# Patient Record
Sex: Female | Born: 1945 | Race: Black or African American | Hispanic: No | Smoking: Former smoker
Health system: Southern US, Community
[De-identification: ages and names within clinical notes are randomized; demographics above are authoritative.]

## PROBLEM LIST (undated history)

## (undated) DIAGNOSIS — J45909 Unspecified asthma, uncomplicated: Secondary | ICD-10-CM

## (undated) DIAGNOSIS — I639 Cerebral infarction, unspecified: Secondary | ICD-10-CM

## (undated) DIAGNOSIS — N189 Chronic kidney disease, unspecified: Secondary | ICD-10-CM

## (undated) DIAGNOSIS — C801 Malignant (primary) neoplasm, unspecified: Secondary | ICD-10-CM

## (undated) DIAGNOSIS — R7303 Prediabetes: Secondary | ICD-10-CM

## (undated) DIAGNOSIS — E785 Hyperlipidemia, unspecified: Secondary | ICD-10-CM

## (undated) DIAGNOSIS — G4730 Sleep apnea, unspecified: Secondary | ICD-10-CM

## (undated) DIAGNOSIS — J449 Chronic obstructive pulmonary disease, unspecified: Secondary | ICD-10-CM

## (undated) DIAGNOSIS — I509 Heart failure, unspecified: Secondary | ICD-10-CM

## (undated) DIAGNOSIS — I10 Essential (primary) hypertension: Secondary | ICD-10-CM

## (undated) HISTORY — DX: Chronic kidney disease, unspecified: N18.9

## (undated) HISTORY — DX: Unspecified asthma, uncomplicated: J45.909

## (undated) HISTORY — DX: Essential (primary) hypertension: I10

## (undated) HISTORY — DX: Cerebral infarction, unspecified: I63.9

## (undated) HISTORY — DX: Chronic obstructive pulmonary disease, unspecified: J44.9

## (undated) HISTORY — DX: Hyperlipidemia, unspecified: E78.5

## (undated) HISTORY — DX: Malignant (primary) neoplasm, unspecified: C80.1

## (undated) HISTORY — DX: Heart failure, unspecified: I50.9

---

## 1988-10-16 HISTORY — PX: OTHER SURGICAL HISTORY: SHX169

## 2018-10-23 LAB — PULMONARY FUNCTION TEST

## 2020-06-28 ENCOUNTER — Ambulatory Visit (INDEPENDENT_AMBULATORY_CARE_PROVIDER_SITE_OTHER): Payer: Medicare PPO

## 2020-06-28 ENCOUNTER — Other Ambulatory Visit: Payer: Self-pay

## 2020-06-28 ENCOUNTER — Ambulatory Visit: Payer: Medicare PPO | Admitting: Pulmonary Disease

## 2020-06-28 ENCOUNTER — Encounter: Payer: Self-pay | Admitting: Pulmonary Disease

## 2020-06-28 VITALS — BP 130/80 | HR 82 | Temp 97.6°F | Ht 69.0 in | Wt 236.2 lb

## 2020-06-28 DIAGNOSIS — J449 Chronic obstructive pulmonary disease, unspecified: Secondary | ICD-10-CM

## 2020-06-28 DIAGNOSIS — G4733 Obstructive sleep apnea (adult) (pediatric): Secondary | ICD-10-CM | POA: Diagnosis not present

## 2020-06-28 MED ORDER — DOXAZOSIN MESYLATE 2 MG PO TABS
2.0000 mg | ORAL_TABLET | Freq: Two times a day (BID) | ORAL | 0 refills | Status: DC
Start: 2020-06-28 — End: 2020-08-03

## 2020-06-28 MED ORDER — VALSARTAN 160 MG PO TABS
160.0000 mg | ORAL_TABLET | Freq: Two times a day (BID) | ORAL | 0 refills | Status: DC
Start: 2020-06-28 — End: 2020-06-29

## 2020-06-28 MED ORDER — TRELEGY ELLIPTA 200-62.5-25 MCG/INH IN AEPB: 1.0000 | INHALATION_SPRAY | Freq: Every day | RESPIRATORY_TRACT | 0 refills | Status: DC

## 2020-06-28 MED ORDER — ALBUTEROL SULFATE 1.25 MG/3ML IN NEBU: 1.0000 | INHALATION_SOLUTION | Freq: Four times a day (QID) | RESPIRATORY_TRACT | 3 refills | Status: AC | PRN

## 2020-06-28 MED ORDER — HYDROCHLOROTHIAZIDE 25 MG PO TABS
25.0000 mg | ORAL_TABLET | Freq: Every day | ORAL | 0 refills | Status: DC
Start: 2020-06-28 — End: 2020-06-29

## 2020-06-28 NOTE — Addendum Note (Signed)
Addended byCoralie Keens on: 06/28/2020 05:11 PM   Modules accepted: Orders

## 2020-06-28 NOTE — Progress Notes (Signed)
Jessica [name]    [id number]    1946/02/16  Primary Care Physician:Patient, No Pcp Per  Referring Physician: No referring provider defined for this encounter.  Chief complaint: Follow-up for COPD, OSA  HPI: 74 year old with severe COPD, OSA She moved to [place] from [place] in July 2021 and is here to establish care Previously followed at [facility] in [place]  Has history of severe COPD with multiple exacerbations in the past Question of noncompliance with medication per office notes She was on Was on Anoro on 2018.  Inhaler changed to Bevespi, Trelegy in 2019.  Due to coverage with insurance she has changed to Advair. Sleep study in 2012 diagnosed with OSA but never started on CPAP.  She was referred for repeat sleep study but was not interested in getting this done Diagnosed with breast cancer in February 2019 with lumpectomy and radiation.  She is currently on hormonal therapy.  Chief complaint is dyspnea on exertion which is gradually getting worse.  Denies any cough, sputum proximal, fevers, chills  Pets: No pets Occupation: Retired Psychologist, counselling Exposures: No known exposures.  No mold, hot tub, Jacuzzi Smoking history: 50-pack-year smoker.  Quit in 2020 Travel history: Previously lived in [place], [place] Relevant family history: No significant family history of lung disease   Outpatient Encounter Medications as of 06/28/2020  Medication Sig  . ADVAIR HFA 230-21 MCG/ACT inhaler Inhale 2 puffs into the lungs 2 (two) times daily.  Marland Kitchen albuterol (ACCUNEB) 1.25 MG/3ML nebulizer solution Take 1 ampule by nebulization every 6 (six) hours as needed for wheezing.  Marland Kitchen albuterol (VENTOLIN HFA) 108 (90 Base) MCG/ACT inhaler Inhale 2 puffs into the lungs every 6 (six) hours as needed.  Marland Kitchen anastrozole (ARIMIDEX) 1 MG tablet Take 1 mg by mouth daily.  Marland Kitchen atorvastatin (LIPITOR) 80 MG tablet Take 80 mg by mouth daily.  . carvedilol (COREG) 25 MG  tablet Take 25 mg by mouth 2 (two) times daily.  . cloNIDine (CATAPRES) 0.2 MG tablet Take 0.2 mg by mouth 2 (two) times daily.  Marland Kitchen doxazosin (CARDURA) 2 MG tablet Take 2 mg by mouth 2 (two) times daily.  Marland Kitchen NIFEdipine (PROCARDIA XL/NIFEDICAL XL) 60 MG 24 hr tablet Take 60 mg by mouth daily.  . valsartan (DIOVAN) 160 MG tablet Take 160 mg by mouth 2 (two) times daily.   No facility-administered encounter medications on file as of 06/28/2020.    Allergies as of 06/28/2020  . (Not on File)    History reviewed. No pertinent past medical history.  History reviewed. No pertinent surgical history.  History reviewed. No pertinent family history.  Social History   Socioeconomic History  . Marital status: Widowed    Spouse name: Not on file  . Number of children: Not on file  . Years of education: Not on file  . Highest education level: Not on file  Occupational History  . Not on file  Tobacco Use  . Smoking status: Former Smoker    Types: Cigarettes    Quit date: 2020    Years since quitting: 1.7  . Smokeless tobacco: Never Used  Substance and Sexual Activity  . Alcohol use: Not on file  . Drug use: Never  . Sexual activity: Not Currently  Other Topics Concern  . Not on file  Social History Narrative  . Not on file   Social Determinants of Health   Financial Resource Strain:   . Difficulty of Paying Living Expenses: Not  on file  Food Insecurity:   . Worried About Charity fundraiser in the Last Year: Not on file  . Ran Out of Food in the Last Year: Not on file  Transportation Needs:   . Lack of Transportation (Medical): Not on file  . Lack of Transportation (Non-Medical): Not on file  Physical Activity:   . Days of Exercise per Week: Not on file  . Minutes of Exercise per Session: Not on file  Stress:   . Feeling of Stress : Not on file  Social Connections:   . Frequency of Communication with Friends and Family: Not on file  . Frequency of Social Gatherings with  Friends and Family: Not on file  . Attends Religious Services: Not on file  . Active Member of Clubs or Organizations: Not on file  . Attends Archivist Meetings: Not on file  . Marital Status: Not on file  Intimate Partner Violence:   . Fear of Current or Ex-Partner: Not on file  . Emotionally Abused: Not on file  . Physically Abused: Not on file  . Sexually Abused: Not on file    Review of systems: Review of Systems  Constitutional: Negative for fever and chills.  HENT: Negative.   Eyes: Negative for blurred vision.  Respiratory: as per HPI  Cardiovascular: Negative for chest pain and palpitations.  Gastrointestinal: Negative for vomiting, diarrhea, blood per rectum. Genitourinary: Negative for dysuria, urgency, frequency and hematuria.  Musculoskeletal: Negative for myalgias, back pain and joint pain.  Skin: Negative for itching and rash.  Neurological: Negative for dizziness, tremors, focal weakness, seizures and loss of consciousness.  Endo/Heme/Allergies: Negative for environmental allergies.  Psychiatric/Behavioral: Negative for depression, suicidal ideas and hallucinations.  All other systems reviewed and are negative.  Physical Exam: Blood pressure 130/80, pulse 82, temperature 97.6 F (36.4 C), temperature source Oral, height 5\' 9"  (1.753 m), weight 236 lb 3.2 oz (107.1 kg), SpO2 92 %. Gen:      No acute distress HEENT:  EOMI, sclera anicteric Neck:     No masses; no thyromegaly Lungs:    Clear to auscultation bilaterally; normal respiratory effort CV:         Regular rate and rhythm; no murmurs Abd:      + bowel sounds; soft, non-tender; no palpable masses, no distension Ext:    No edema; adequate peripheral perfusion Skin:      Warm and dry; no rash Neuro: alert and oriented x 3 Psych: normal mood and affect  Data Reviewed: Imaging:  PFTs: 10/21/2018 FVC 2.03 [69%], FEV1 1.16 [51%], F/F 57, TLC 4.75 [93%], DLCO 19.3 [92%] Severe  obstruction  Labs:  Echocardiogram 12/23/2019 LVEF 55-60%, mild LVH, grade 2 diastolic dysfunction  Assessment:  Severe COPD Change Advair to Trelegy.  She may have insurance coverage issues.  I have asked her to get her insurance formulary so we can review preferred medication Continue duo nebs as needed Check baseline labs including metabolic panel, CBC, IgE, alpha-1 antitrypsin levels and phenotype Get chest x-ray today  Schedule PFTs and refer for annual low-dose screening CT of the chest  Sleep apnea. Not on treatment.  Schedule split-night sleep study  Hypertensiom Has run out of her medication including valsartan, doxazosin and hydrochlorothiazide.  We will renew this until she can have her first PCP appointment early next month.  Plan/Recommendations: Change Advair to Trelegy CBC, CMP, IgE, alpha-1 antitrypsin levels and phenotype PFTs, chest x-ray Split-night sleep study  Marshell Garfinkel MD [facility] 06/28/2020, 1:48 PM  CC: No ref. provider found

## 2020-06-28 NOTE — Patient Instructions (Addendum)
We will check some labs today including comprehensive metabolic panel, CBC differential, IgE, alpha-1 antitrypsin levels and phenotype Schedule pulmonary function test Refer for low-dose screening CT of the chest  We will give you a sample of Trelegy 200 inhaler and send in a prescription.  Use this instead of Advair We will renew the duo nebs Schedule split-night sleep study  Follow-up in 3 months.

## 2020-06-29 ENCOUNTER — Telehealth: Payer: Self-pay

## 2020-06-29 ENCOUNTER — Other Ambulatory Visit: Payer: Self-pay | Admitting: Pulmonary Disease

## 2020-06-29 MED ORDER — VALSARTAN 160 MG PO TABS
160.0000 mg | ORAL_TABLET | Freq: Two times a day (BID) | ORAL | 1 refills | Status: DC
Start: 2020-06-29 — End: 2020-08-03

## 2020-06-29 MED ORDER — HYDROCHLOROTHIAZIDE 25 MG PO TABS
25.0000 mg | ORAL_TABLET | Freq: Every day | ORAL | 0 refills | Status: DC
Start: 2020-06-29 — End: 2020-08-03

## 2020-06-29 NOTE — Telephone Encounter (Signed)
Left a voicemail for notify patient that medication for Hydrochlorothiazide 25mg  and valsartan 160mg  sent into her pharmacy . Nothing else further needed.

## 2020-06-29 NOTE — Telephone Encounter (Signed)
Ok to refill 

## 2020-06-29 NOTE — Telephone Encounter (Signed)
[facility] with a call report on 9/14 cxr.  Impression copied below, full report available in Epic.    IMPRESSION: There is lingular consolidation which may reflect pneumonia. However, a mass at this site or a central mass with postobstructive atelectasis cannot be excluded, and radiographic follow-up to resolution is recommended.  Dr. Vaughan Browner please advise, thanks!

## 2020-06-30 MED ORDER — AZITHROMYCIN 250 MG PO TABS: ORAL_TABLET | ORAL | 0 refills | Status: DC

## 2020-06-30 NOTE — Telephone Encounter (Signed)
Called and spoke with Nephew Daron per DPR to let him know that we were going to be sending in a Zpak due to her CXR showing Pneumonia. RX will be sent to preferred pharmacy. Advised him that Dr.Mannam would like to have her CT pushed back a week till she is finished with medication. Nephew asked if we could call and reschedule it and then just call him with that information. Renee Pain [facility] a message to let her know. Nothing further needed at this time.

## 2020-06-30 NOTE — Telephone Encounter (Signed)
Please call in z pack.  Looks like she has a CT low dose scheduled for next week. Can you change it to first week of oct so that we can follow up on resolution of the consolidation.

## 2020-07-07 ENCOUNTER — Ambulatory Visit: Payer: Medicare PPO

## 2020-07-14 ENCOUNTER — Ambulatory Visit (INDEPENDENT_AMBULATORY_CARE_PROVIDER_SITE_OTHER)
Admission: RE | Admit: 2020-07-14 | Discharge: 2020-07-14 | Disposition: A | Discharge status: Home / Self Care | Payer: Medicare PPO | Source: Ambulatory Visit | Attending: Pulmonary Disease | Admitting: Pulmonary Disease

## 2020-07-14 ENCOUNTER — Other Ambulatory Visit: Payer: Self-pay

## 2020-07-14 DIAGNOSIS — Z87891 Personal history of nicotine dependence: Secondary | ICD-10-CM

## 2020-07-14 DIAGNOSIS — J449 Chronic obstructive pulmonary disease, unspecified: Secondary | ICD-10-CM

## 2020-07-15 ENCOUNTER — Telehealth: Payer: Self-pay | Admitting: Pulmonary Disease

## 2020-07-15 MED ORDER — TRELEGY ELLIPTA 200-62.5-25 MCG/INH IN AEPB: 1.0000 | INHALATION_SPRAY | Freq: Every day | RESPIRATORY_TRACT | 11 refills | Status: DC

## 2020-07-15 NOTE — Telephone Encounter (Signed)
Spoke with the pt's nephew Daron pk per DPR and notified rx for trelegy sent to pharm  Nothing further needed

## 2020-07-20 ENCOUNTER — Other Ambulatory Visit: Payer: Self-pay

## 2020-07-20 ENCOUNTER — Other Ambulatory Visit: Payer: Self-pay | Admitting: Pulmonary Disease

## 2020-07-20 DIAGNOSIS — R918 Other nonspecific abnormal finding of lung field: Secondary | ICD-10-CM

## 2020-07-21 ENCOUNTER — Ambulatory Visit: Payer: Medicare Other | Admitting: Family Medicine

## 2020-07-21 ENCOUNTER — Encounter: Payer: Self-pay | Admitting: Family Medicine

## 2020-07-21 VITALS — BP 130/80 | HR 88 | Temp 97.8°F | Ht 68.0 in | Wt 236.4 lb

## 2020-07-21 DIAGNOSIS — Z13820 Encounter for screening for osteoporosis: Secondary | ICD-10-CM

## 2020-07-21 DIAGNOSIS — N189 Chronic kidney disease, unspecified: Secondary | ICD-10-CM

## 2020-07-21 DIAGNOSIS — I2510 Atherosclerotic heart disease of native coronary artery without angina pectoris: Secondary | ICD-10-CM | POA: Diagnosis not present

## 2020-07-21 DIAGNOSIS — I252 Old myocardial infarction: Secondary | ICD-10-CM

## 2020-07-21 DIAGNOSIS — Z23 Encounter for immunization: Secondary | ICD-10-CM

## 2020-07-21 DIAGNOSIS — Z853 Personal history of malignant neoplasm of breast: Secondary | ICD-10-CM | POA: Diagnosis not present

## 2020-07-21 DIAGNOSIS — J449 Chronic obstructive pulmonary disease, unspecified: Secondary | ICD-10-CM

## 2020-07-21 DIAGNOSIS — Z8673 Personal history of transient ischemic attack (TIA), and cerebral infarction without residual deficits: Secondary | ICD-10-CM

## 2020-07-21 MED ORDER — ANASTROZOLE 1 MG PO TABS: 1.0000 mg | ORAL_TABLET | Freq: Every day | ORAL | 3 refills | Status: DC

## 2020-07-21 MED ORDER — ALBUTEROL SULFATE HFA 108 (90 BASE) MCG/ACT IN AERS: 2.0000 | INHALATION_SPRAY | Freq: Four times a day (QID) | RESPIRATORY_TRACT | 3 refills | Status: DC | PRN

## 2020-07-21 MED ORDER — TRELEGY ELLIPTA 200-62.5-25 MCG/INH IN AEPB: 1.0000 | INHALATION_SPRAY | Freq: Every day | RESPIRATORY_TRACT | 11 refills | Status: DC

## 2020-07-21 NOTE — Progress Notes (Signed)
Jessica [name] is a 74 y.o. female  Chief Complaint  Patient presents with  . Establish Care    establish care and discuss chronic conditions.    HPI: Jessica [name] is a 74 y.o. female here to establish care with our office. She is accompanied by her nephew. She recently moved to area from [place] in 04/2020. We do not yet have records from previous PCP, although nephew states he signed form 2 wks ago.   She has a h/o Lt breast cancer. She was following with oncology. She is overdue for f/u as well as mammo. She also needs refill of her arimidex which she has been out of for 2+ wks.   She needs a referral to cardio - h/o MI, CAD, stent x 2, MI, CVA.  Last labs in 02/2020. Pt was supposed to see nephro for CKD but did not prior to moving. Needs nephro referral.   COPD - Pulm (Dr. Marshell Garfinkel w/ [facility]), appt scheduled with Dr. Halford Chessman for sleep apnea re-eval. Jessica [name] has been and continues to be out of Trelegy inhaler. Pt has been using advair but not as effective as Trelegy. Nephew would like to try another pharmacy to get it.   Last mammo: h/o breast cancer (2019), xrt and lumpectomy Last Dexa: not in past 2 year Last colonoscopy: maybe in 2016 ??  Med refills needed today: see orders  She will get her flu vaccine today. She received her covid vaccine. Due for booster in 08/2020  Past Medical History:  Diagnosis Date  . Asthma   . Cancer ([place])   . CHF (congestive heart failure) ([place])   . Chronic kidney disease   . COPD (chronic obstructive pulmonary disease) ([place])   . Hyperlipidemia   . Hypertension   . Stroke [facility])     Past Surgical History:  Procedure Laterality Date  . stomach surgery  1990    Social History   Socioeconomic History  . Marital status: Widowed    Spouse name: Not on file  . Number of children: Not on file  . Years of education: Not on file  . Highest education level: Not on file  Occupational History  . Not on file  Tobacco Use  .  Smoking status: Former Smoker    Types: Cigarettes    Quit date: 2020    Years since quitting: 1.7  . Smokeless tobacco: Never Used  Vaping Use  . Vaping Use: Never used  Substance and Sexual Activity  . Alcohol use: Never  . Drug use: Never  . Sexual activity: Not Currently  Other Topics Concern  . Not on file  Social History Narrative  . Not on file   Social Determinants of Health   Financial Resource Strain:   . Difficulty of Paying Living Expenses: Not on file  Food Insecurity:   . Worried About Charity fundraiser in the Last Year: Not on file  . Ran Out of Food in the Last Year: Not on file  Transportation Needs:   . Lack of Transportation (Medical): Not on file  . Lack of Transportation (Non-Medical): Not on file  Physical Activity:   . Days of Exercise per Week: Not on file  . Minutes of Exercise per Session: Not on file  Stress:   . Feeling of Stress : Not on file  Social Connections:   . Frequency of Communication with Friends and Family: Not on file  . Frequency of Social Gatherings with Friends and Family:  Not on file  . Attends Religious Services: Not on file  . Active Member of Clubs or Organizations: Not on file  . Attends Archivist Meetings: Not on file  . Marital Status: Not on file  Intimate Partner Violence:   . Fear of Current or Ex-Partner: Not on file  . Emotionally Abused: Not on file  . Physically Abused: Not on file  . Sexually Abused: Not on file    Family History  Problem Relation Age of Onset  . Stroke Mother   . Heart disease Mother   . Hyperlipidemia Mother   . Heart disease Maternal Aunt   . Hyperlipidemia Maternal Aunt   . Stroke Maternal Aunt      Immunization History  Administered Date(s) Administered  . PFIZER SARS-COV-2 Vaccination 12/12/2019, 12/23/2019    Outpatient Encounter Medications as of 07/21/2020  Medication Sig  . ADVAIR HFA 230-21 MCG/ACT inhaler Inhale 2 puffs into the lungs 2 (two) times daily.    Marland Kitchen albuterol (ACCUNEB) 1.25 MG/3ML nebulizer solution Take 3 mLs (1.25 mg total) by nebulization every 6 (six) hours as needed for wheezing.  Marland Kitchen albuterol (VENTOLIN HFA) 108 (90 Base) MCG/ACT inhaler Inhale 2 puffs into the lungs every 6 (six) hours as needed.  Marland Kitchen anastrozole (ARIMIDEX) 1 MG tablet Take 1 tablet (1 mg total) by mouth daily.  Marland Kitchen atorvastatin (LIPITOR) 80 MG tablet Take 80 mg by mouth daily.  . carvedilol (COREG) 25 MG tablet Take 25 mg by mouth 2 (two) times daily.  . cloNIDine (CATAPRES) 0.2 MG tablet Take 0.2 mg by mouth 2 (two) times daily.  Marland Kitchen doxazosin (CARDURA) 2 MG tablet Take 1 tablet (2 mg total) by mouth 2 (two) times daily.  . Fluticasone-Umeclidin-Vilant (TRELEGY ELLIPTA) 200-62.5-25 MCG/INH AEPB Inhale 1 puff into the lungs daily.  . hydrochlorothiazide (HYDRODIURIL) 25 MG tablet Take 1 tablet (25 mg total) by mouth daily.  Marland Kitchen NIFEdipine (PROCARDIA XL/NIFEDICAL XL) 60 MG 24 hr tablet Take 60 mg by mouth daily.  . valsartan (DIOVAN) 160 MG tablet Take 1 tablet (160 mg total) by mouth 2 (two) times daily.  . [DISCONTINUED] albuterol (VENTOLIN HFA) 108 (90 Base) MCG/ACT inhaler Inhale 2 puffs into the lungs every 6 (six) hours as needed.  . [DISCONTINUED] anastrozole (ARIMIDEX) 1 MG tablet Take 1 mg by mouth daily.  . [DISCONTINUED] Fluticasone-Umeclidin-Vilant (TRELEGY ELLIPTA) 200-62.5-25 MCG/INH AEPB Inhale 1 puff into the lungs daily.  . [DISCONTINUED] azithromycin (ZITHROMAX) 250 MG tablet Take 2 tablets first day then 1 tablet daily until finished. (Patient not taking: Reported on 07/21/2020)   No facility-administered encounter medications on file as of 07/21/2020.     ROS: Gen: no fever, chills  Skin: no rash, itching Eyes: no blurry vision, double vision Resp: SOB CV: no CP, palpitations, LE edema GI: no heartburn, n/v/d/c, abd pain GU: no dysuria, urgency, frequency, hematuria  MSK: no joint pain, myalgias, back pain Neuro: no dizziness, headache,  weakness Psych: no depression, anxiety, insomnia   Not on File  BP 130/80   Pulse 88   Temp 97.8 F (36.6 C) (Temporal)   Ht 5\' 8"  (1.727 m)   Wt 236 lb 6.4 oz (107.2 kg)   SpO2 96%   BMI 35.94 kg/m   Physical Exam Constitutional:      General: She is not in acute distress.    Appearance: Normal appearance. She is obese. She is not ill-appearing.  Cardiovascular:     Rate and Rhythm: Normal rate and regular rhythm.  Pulses: Normal pulses.  Pulmonary:     Effort: No respiratory distress.  Neurological:     Mental Status: She is alert and oriented to person, place, and time.  Psychiatric:        Mood and Affect: Mood normal.        Behavior: Behavior normal.      A/P:  1. Chronic obstructive pulmonary disease, unspecified COPD type ([place]) - pt has established with pulm Dr. Vaughan Browner and has f/u in 09/2020 Refill: - albuterol (VENTOLIN HFA) 108 (90 Base) MCG/ACT inhaler; Inhale 2 puffs into the lungs every 6 (six) hours as needed.  Dispense: 1 each; Refill: 3 Refill: - Fluticasone-Umeclidin-Vilant (TRELEGY ELLIPTA) 200-62.5-25 MCG/INH AEPB; Inhale 1 puff into the lungs daily.  Dispense: 60 each; Refill: 11 - pt has been unable to get this med d/t pharmacy not having in stock. Nephew would like Rx printed and will try at various local pharmacies - pt will stop advair once she resumes Trelegy  2. Personal history of breast cancer - Ambulatory referral to Hematology / Oncology - MM Digital Diagnostic Bilat; Future Refill: - anastrozole (ARIMIDEX) 1 MG tablet; Take 1 tablet (1 mg total) by mouth daily.  Dispense: 90 tablet; Refill: 3 - CBC; Future  3. Coronary artery disease involving native coronary artery of native heart without angina pectoris 4. History of MI (myocardial infarction) 5. H/O: CVA (cerebrovascular accident) - Ambulatory referral to Cardiology - Lipid panel; Future - cont current meds and doses - lipitor, procardia XL, HCTZ, coreg, cardura, diovan,  clonidine    6. Chronic kidney disease, unspecified CKD stage - Ambulatory referral to Nephrology - Comprehensive metabolic panel; Future  7. Screening for osteoporosis - DG Bone Density; Future - VITAMIN D 25 Hydroxy (Vit-D Deficiency, Fractures); Future  8. Need for influenza vaccination - Flu Vaccine QUAD High Dose(Fluad)   This visit occurred during the SARS-CoV-2 public health emergency.  Safety protocols were in place, including screening questions prior to the visit, additional usage of staff PPE, and extensive cleaning of exam room while observing appropriate contact time as indicated for disinfecting solutions.

## 2020-07-22 ENCOUNTER — Ambulatory Visit: Payer: Self-pay | Admitting: Family Medicine

## 2020-07-27 ENCOUNTER — Other Ambulatory Visit: Payer: Self-pay

## 2020-07-27 ENCOUNTER — Encounter (HOSPITAL_BASED_OUTPATIENT_CLINIC_OR_DEPARTMENT_OTHER): Payer: Medicare PPO | Admitting: Pulmonary Disease

## 2020-07-28 ENCOUNTER — Other Ambulatory Visit: Payer: Medicare Other

## 2020-07-28 ENCOUNTER — Other Ambulatory Visit (INDEPENDENT_AMBULATORY_CARE_PROVIDER_SITE_OTHER): Payer: Medicare Other

## 2020-07-28 DIAGNOSIS — N189 Chronic kidney disease, unspecified: Secondary | ICD-10-CM | POA: Diagnosis not present

## 2020-07-28 DIAGNOSIS — Z853 Personal history of malignant neoplasm of breast: Secondary | ICD-10-CM

## 2020-07-28 DIAGNOSIS — Z13820 Encounter for screening for osteoporosis: Secondary | ICD-10-CM | POA: Diagnosis not present

## 2020-07-28 DIAGNOSIS — I252 Old myocardial infarction: Secondary | ICD-10-CM

## 2020-07-28 DIAGNOSIS — I2510 Atherosclerotic heart disease of native coronary artery without angina pectoris: Secondary | ICD-10-CM | POA: Diagnosis not present

## 2020-07-28 LAB — CBC
HCT: 45.5 % (ref 36.0–46.0)
Hemoglobin: 14.8 g/dL (ref 12.0–15.0)
MCHC: 32.6 g/dL (ref 30.0–36.0)
MCV: 89 fl (ref 78.0–100.0)
Platelets: 181 10*3/uL (ref 150.0–400.0)
RBC: 5.11 Mil/uL (ref 3.87–5.11)
RDW: 14.7 % (ref 11.5–15.5)
WBC: 9 10*3/uL (ref 4.0–10.5)

## 2020-07-28 LAB — COMPREHENSIVE METABOLIC PANEL
ALT: 21 U/L (ref 0–35)
AST: 17 U/L (ref 0–37)
Albumin: 4.1 g/dL (ref 3.5–5.2)
Alkaline Phosphatase: 137 U/L — ABNORMAL HIGH (ref 39–117)
BUN: 32 mg/dL — ABNORMAL HIGH (ref 6–23)
CO2: 33 mEq/L — ABNORMAL HIGH (ref 19–32)
Calcium: 10 mg/dL (ref 8.4–10.5)
Chloride: 101 mEq/L (ref 96–112)
Creatinine, Ser: 1.28 mg/dL — ABNORMAL HIGH (ref 0.40–1.20)
GFR: 41.76 mL/min — ABNORMAL LOW (ref >60.00)
Glucose, Bld: 76 mg/dL (ref 70–99)
Potassium: 3.4 mEq/L — ABNORMAL LOW (ref 3.5–5.1)
Sodium: 141 mEq/L (ref 135–145)
Total Bilirubin: 0.6 mg/dL (ref 0.2–1.2)
Total Protein: 7.3 g/dL (ref 6.0–8.3)

## 2020-07-28 LAB — LIPID PANEL
Cholesterol: 164 mg/dL (ref 0–200)
HDL: 52.6 mg/dL (ref >39.00)
LDL Cholesterol: 82 mg/dL (ref 0–99)
NonHDL: 111.66
Total CHOL/HDL Ratio: 3
Triglycerides: 146 mg/dL (ref 0.0–149.0)
VLDL: 29.2 mg/dL (ref 0.0–40.0)

## 2020-07-28 LAB — VITAMIN D 25 HYDROXY (VIT D DEFICIENCY, FRACTURES): VITD: <7 ng/mL — ABNORMAL LOW (ref 30.00–100.00)

## 2020-07-30 ENCOUNTER — Other Ambulatory Visit: Payer: Self-pay | Admitting: Family Medicine

## 2020-07-30 DIAGNOSIS — E559 Vitamin D deficiency, unspecified: Secondary | ICD-10-CM

## 2020-07-30 MED ORDER — VITAMIN D (ERGOCALCIFEROL) 1.25 MG (50000 UNIT) PO CAPS: 50000.0000 [IU] | ORAL_CAPSULE | ORAL | 3 refills | Status: DC

## 2020-08-02 ENCOUNTER — Other Ambulatory Visit: Payer: Self-pay

## 2020-08-02 ENCOUNTER — Telehealth: Payer: Self-pay | Admitting: Family Medicine

## 2020-08-02 DIAGNOSIS — E559 Vitamin D deficiency, unspecified: Secondary | ICD-10-CM

## 2020-08-02 MED ORDER — VITAMIN D (ERGOCALCIFEROL) 1.25 MG (50000 UNIT) PO CAPS: 50000.0000 [IU] | ORAL_CAPSULE | ORAL | 3 refills | Status: DC

## 2020-08-02 NOTE — Telephone Encounter (Signed)
Contacted pharmacy to verify they didn't received RX then resent to pharmacy due to E-scribe error on 07/30/20.   Dm/cma

## 2020-08-02 NOTE — Telephone Encounter (Signed)
Patient nephew is calling and requesting a refill for doxazosin, valsartan and hydrochlorothiazide and also patient is asking for a prescription for travodone to help her sleep at night sent to [facility] on [address], please advise. CB is [phone]

## 2020-08-03 ENCOUNTER — Other Ambulatory Visit: Payer: Self-pay | Admitting: Family Medicine

## 2020-08-03 MED ORDER — DOXAZOSIN MESYLATE 2 MG PO TABS: 2.0000 mg | ORAL_TABLET | Freq: Two times a day (BID) | ORAL | 0 refills | Status: DC

## 2020-08-03 MED ORDER — HYDROCHLOROTHIAZIDE 25 MG PO TABS
25.0000 mg | ORAL_TABLET | Freq: Every day | ORAL | 0 refills | Status: DC
Start: 2020-08-03 — End: 2020-08-17

## 2020-08-03 MED ORDER — VALSARTAN 160 MG PO TABS
160.0000 mg | ORAL_TABLET | Freq: Two times a day (BID) | ORAL | 1 refills | Status: DC
Start: 2020-08-03 — End: 2020-08-17

## 2020-08-03 NOTE — Telephone Encounter (Signed)
I informed nephew that Dr. Loletha Grayer will be in office tomorrow to discuss the sleeping pill.  I wil send in other medications for a refill today.

## 2020-08-04 NOTE — Telephone Encounter (Signed)
Pt will need vv to discuss sleep issues and Rx sleep medication

## 2020-08-04 NOTE — Telephone Encounter (Signed)
VV scheduled for this Friday.  Her nephew, Jerral Bonito will discuss sleep issues for pt.

## 2020-08-06 ENCOUNTER — Encounter: Payer: Self-pay | Admitting: Family Medicine

## 2020-08-06 ENCOUNTER — Telehealth (INDEPENDENT_AMBULATORY_CARE_PROVIDER_SITE_OTHER): Payer: Medicare Other | Admitting: Family Medicine

## 2020-08-06 ENCOUNTER — Other Ambulatory Visit: Payer: Self-pay

## 2020-08-06 VITALS — Ht 68.0 in | Wt 236.0 lb

## 2020-08-06 DIAGNOSIS — F419 Anxiety disorder, unspecified: Secondary | ICD-10-CM | POA: Diagnosis not present

## 2020-08-06 DIAGNOSIS — Z72820 Sleep deprivation: Secondary | ICD-10-CM | POA: Diagnosis not present

## 2020-08-06 MED ORDER — SERTRALINE HCL 25 MG PO TABS: 25.0000 mg | ORAL_TABLET | Freq: Every day | ORAL | 1 refills | Status: DC

## 2020-08-06 MED ORDER — TRAZODONE HCL 50 MG PO TABS: 50.0000 mg | ORAL_TABLET | Freq: Every evening | ORAL | 1 refills | Status: DC | PRN

## 2020-08-06 NOTE — Progress Notes (Addendum)
Virtual Visit via Video Note  I connected with Jessica [name] on 08/06/20 at 11:30 AM EDT by a video enabled telemedicine application and verified that I am speaking with the correct person using two identifiers. Location patient: home Location provider: work or home office Persons participating in the virtual visit: provider, pts nephew Jessica [name], patient  I discussed the limitations of evaluation and management by telemedicine and the availability of in person appointments. The patient expressed understanding and agreed to proceed.  Chief Complaint  Patient presents with  . Follow-up    discuss sleeping meds,  Nephew (Jessica [name]) states that patient isn't sleeping well at night.   Interactive audio and video telecommunications were attempted between myself and the patient, however failed, due to the patient having technical difficulties. We continued and completed the visit with audio only.   HPI: Jessica [name] is a 74 y.o. female who recently established care with our office (07/21/20) after moving to [place] from [place].  Pt states she is not sleeping well at night. She feels her mind "has lots of things going through it". This has been going on for a few years. She also feels anxious during the day, thinking about lots of things, trouble relaxing. Symptoms mainly since husband passed away.  She was on anxiety medication years ago. She has never taken anything for sleep.   Past Medical History:  Diagnosis Date  . Asthma   . Cancer ([place])   . CHF (congestive heart failure) ([place])   . Chronic kidney disease   . COPD (chronic obstructive pulmonary disease) ([place])   . Hyperlipidemia   . Hypertension   . Stroke [facility])     Past Surgical History:  Procedure Laterality Date  . stomach surgery  1990    Family History  Problem Relation Age of Onset  . Stroke Mother   . Heart disease Mother   . Hyperlipidemia Mother   . Heart disease Maternal Aunt   . Hyperlipidemia Maternal Aunt   .  Stroke Maternal Aunt     Social History   Tobacco Use  . Smoking status: Former Smoker    Types: Cigarettes    Quit date: 2020    Years since quitting: 1.8  . Smokeless tobacco: Never Used  Vaping Use  . Vaping Use: Never used  Substance Use Topics  . Alcohol use: Never  . Drug use: Never     Current Outpatient Medications:  .  ADVAIR HFA 657-84 MCG/ACT inhaler, Inhale 2 puffs into the lungs 2 (two) times daily. , Disp: , Rfl:  .  albuterol (ACCUNEB) 1.25 MG/3ML nebulizer solution, Take 3 mLs (1.25 mg total) by nebulization every 6 (six) hours as needed for wheezing., Disp: 75 mL, Rfl: 3 .  albuterol (VENTOLIN HFA) 108 (90 Base) MCG/ACT inhaler, Inhale 2 puffs into the lungs every 6 (six) hours as needed., Disp: 1 each, Rfl: 3 .  anastrozole (ARIMIDEX) 1 MG tablet, Take 1 tablet (1 mg total) by mouth daily., Disp: 90 tablet, Rfl: 3 .  atorvastatin (LIPITOR) 80 MG tablet, Take 80 mg by mouth daily., Disp: , Rfl:  .  carvedilol (COREG) 25 MG tablet, Take 25 mg by mouth 2 (two) times daily., Disp: , Rfl:  .  cloNIDine (CATAPRES) 0.2 MG tablet, Take 0.2 mg by mouth 2 (two) times daily., Disp: , Rfl:  .  doxazosin (CARDURA) 2 MG tablet, TAKE 1 TABLET(2 MG) BY MOUTH TWICE DAILY, Disp: 180 tablet, Rfl: 0 .  Fluticasone-Umeclidin-Vilant (TRELEGY ELLIPTA) 200-62.5-25 MCG/INH  AEPB, Inhale 1 puff into the lungs daily., Disp: 60 each, Rfl: 11 .  hydrochlorothiazide (HYDRODIURIL) 25 MG tablet, Take 1 tablet (25 mg total) by mouth daily., Disp: 90 tablet, Rfl: 0 .  NIFEdipine (PROCARDIA XL/NIFEDICAL XL) 60 MG 24 hr tablet, Take 60 mg by mouth daily., Disp: , Rfl:  .  valsartan (DIOVAN) 160 MG tablet, Take 1 tablet (160 mg total) by mouth 2 (two) times daily., Disp: 180 tablet, Rfl: 1 .  Vitamin D, Ergocalciferol, (DRISDOL) 1.25 MG (50000 UNIT) CAPS capsule, Take 1 capsule (50,000 Units total) by mouth every 7 (seven) days., Disp: 5 capsule, Rfl: 3  No Known Allergies    ROS: See pertinent  positives and negatives per HPI.   EXAM:  VITALS per patient if applicable: Ht 5\' 8"  (1.727 m)   Wt 236 lb (107 kg) Comment: pt reported  BMI 35.88 kg/m    GENERAL: alert, oriented, appears well and in no acute distress  HEENT: atraumatic, conjunctiva clear, no obvious abnormalities on inspection of external nose and ears  NECK: normal movements of the head and neck  LUNGS: on inspection no signs of respiratory distress, breathing rate appears normal, no obvious gross SOB, gasping or wheezing, no conversational dyspnea  CV: no obvious cyanosis  PSYCH/NEURO: pleasant and cooperative, speech and thought processing grossly intact   ASSESSMENT AND PLAN: 1. Anxiety - x years, now affecting her sleep. She has not been on medication for anxiety in years Rx: - sertraline (ZOLOFT) 25 MG tablet; Take 1 tablet (25 mg total) by mouth daily.  Dispense: 90 tablet; Refill: 1 - f/u in 3 wks or sooner PRN  2. Poor sleep - likely a symptom of her anxiety - see above - I am agreeable to pt trying trazodone qHS to see if this helps with sleep over the next few weeks while she is starting and getting to therapeutic level of sertraline Rx: - traZODone (DESYREL) 50 MG tablet; Take 1 tablet (50 mg total) by mouth at bedtime as needed for sleep.  Dispense: 30 tablet; Refill: 1 - f/u in 3 wks or sooner PRN  Discussed plan and reviewed medications with patient, including risks, benefits, and potential side effects. Pt expressed understand. All questions answered.    I discussed the assessment and treatment plan with the patient. The patient was provided an opportunity to ask questions and all were answered. The patient agreed with the plan and demonstrated an understanding of the instructions.   The patient was advised to call back or seek an in-person evaluation if the symptoms worsen or if the condition fails to improve as anticipated.  I spent 16 min with the patient and her nephew for this  encounter.    Letta Median, DO

## 2020-08-08 NOTE — Progress Notes (Signed)
Cardiology Office Note:    Date:  08/11/2020   ID:  Marliss Coots, DOB 01/28/1946, MRN [id number]  PCP:  Ronnald Nian, DO  Cardiologist:  No primary care provider on file.  Electrophysiologist:  None   Referring MD: Ronnald Nian, DO   Chief Complaint  Patient presents with  . Chest Pain   History of Present Illness:    Jessica [name] is a 74 y.o. female with a hx of CVA, COPD, hypertension, hyperlipidemia, CKD, breast cancer, CAD status post PCI.  She moved to [place] from [place] in July 2021.  Reports a history of CAD status post MI in stent x2.  Reports first MI was in 2010, had stent at [facility].  Second stent was DES to LAD in 2017.  She reports she awoke this morning with chest pain.  Has been having chest pain all day.  Reports 9 out of 10 pain.  Also reports having headache all day.  BP has been up to 200s over 110s at home   Past Medical History:  Diagnosis Date  . Asthma   . Cancer ([place])   . CHF (congestive heart failure) ([place])   . Chronic kidney disease   . COPD (chronic obstructive pulmonary disease) ([place])   . Hyperlipidemia   . Hypertension   . Stroke [facility])     Past Surgical History:  Procedure Laterality Date  . stomach surgery  1990    Current Medications: Current Facility-Administered Medications for the 08/11/20 encounter (Office Visit) with Donato Heinz, MD  Medication  . nitroGLYCERIN (NITROSTAT) SL tablet 0.4 mg   Current Meds  Medication Sig  . albuterol (ACCUNEB) 1.25 MG/3ML nebulizer solution Take 3 mLs (1.25 mg total) by nebulization every 6 (six) hours as needed for wheezing.  Marland Kitchen albuterol (VENTOLIN HFA) 108 (90 Base) MCG/ACT inhaler Inhale 2 puffs into the lungs every 6 (six) hours as needed.  Marland Kitchen atorvastatin (LIPITOR) 80 MG tablet Take 80 mg by mouth daily.  . carvedilol (COREG) 25 MG tablet Take 25 mg by mouth 2 (two) times daily.  . cloNIDine (CATAPRES) 0.2 MG tablet Take 0.2 mg by mouth 2 (two) times daily.    Marland Kitchen doxazosin (CARDURA) 2 MG tablet TAKE 1 TABLET(2 MG) BY MOUTH TWICE DAILY  . Fluticasone-Umeclidin-Vilant (TRELEGY ELLIPTA) 200-62.5-25 MCG/INH AEPB Inhale 1 puff into the lungs daily.  . hydrochlorothiazide (HYDRODIURIL) 25 MG tablet Take 1 tablet (25 mg total) by mouth daily.  . sertraline (ZOLOFT) 25 MG tablet Take 1 tablet (25 mg total) by mouth daily.  . traZODone (DESYREL) 50 MG tablet Take 1 tablet (50 mg total) by mouth at bedtime as needed for sleep.  . valsartan (DIOVAN) 160 MG tablet Take 1 tablet (160 mg total) by mouth 2 (two) times daily.  . Vitamin D, Ergocalciferol, (DRISDOL) 1.25 MG (50000 UNIT) CAPS capsule Take 1 capsule (50,000 Units total) by mouth every 7 (seven) days.     Allergies:   Patient has no known allergies.   Social History   Socioeconomic History  . Marital status: Widowed    Spouse name: Not on file  . Number of children: Not on file  . Years of education: Not on file  . Highest education level: Not on file  Occupational History  . Not on file  Tobacco Use  . Smoking status: Former Smoker    Types: Cigarettes    Quit date: 2020    Years since quitting: 1.8  . Smokeless tobacco: Never Used  Vaping Use  .  Vaping Use: Never used  Substance and Sexual Activity  . Alcohol use: Never  . Drug use: Never  . Sexual activity: Not Currently  Other Topics Concern  . Not on file  Social History Narrative  . Not on file   Social Determinants of Health   Financial Resource Strain:   . Difficulty of Paying Living Expenses: Not on file  Food Insecurity:   . Worried About Charity fundraiser in the Last Year: Not on file  . Ran Out of Food in the Last Year: Not on file  Transportation Needs:   . Lack of Transportation (Medical): Not on file  . Lack of Transportation (Non-Medical): Not on file  Physical Activity:   . Days of Exercise per Week: Not on file  . Minutes of Exercise per Session: Not on file  Stress:   . Feeling of Stress : Not on file   Social Connections:   . Frequency of Communication with Friends and Family: Not on file  . Frequency of Social Gatherings with Friends and Family: Not on file  . Attends Religious Services: Not on file  . Active Member of Clubs or Organizations: Not on file  . Attends Archivist Meetings: Not on file  . Marital Status: Not on file     Family History: The patient's family history includes Heart disease in her maternal aunt and mother; Hyperlipidemia in her maternal aunt and mother; Stroke in her maternal aunt and mother.  ROS:   Please see the history of present illness.     All other systems reviewed and are negative.  EKGs/Labs/Other Studies Reviewed:    The following studies were reviewed today:   EKG:  EKG is  ordered today.  The ekg ordered today demonstrates normal sinus rhythm, Q waves in V1/2, nonspecific T wave flattening, no ST changes.  Recent Labs: 07/28/2020: ALT 21; BUN 32; Creatinine, Ser 1.28; Hemoglobin 14.8; Platelets 181.0; Potassium 3.4; Sodium 141  Recent Lipid Panel    Component Value Date/Time   CHOL 164 07/28/2020 1514   TRIG 146.0 07/28/2020 1514   HDL 52.60 07/28/2020 1514   CHOLHDL 3 07/28/2020 1514   VLDL 29.2 07/28/2020 1514   LDLCALC 82 07/28/2020 1514    Physical Exam:    VS:  BP (!) 208/100 (BP Location: Left Arm, Patient Position: Sitting)   Pulse 72   Ht 5\' 8"  (1.727 m)   Wt 235 lb 3.2 oz (106.7 kg)   SpO2 94%   BMI 35.76 kg/m     Wt Readings from Last 3 Encounters:  08/11/20 235 lb 3.2 oz (106.7 kg)  08/11/20 235 lb 3.2 oz (106.7 kg)  08/06/20 236 lb (107 kg)     GEN:  in no acute distress HEENT: Normal NECK: No JVD; No carotid bruits LYMPHATICS: No lymphadenopathy CARDIAC: RRR, no murmurs, rubs, gallops RESPIRATORY:  Clear to auscultation without rales, wheezing or rhonchi  ABDOMEN: Soft, non-tender, non-distended MUSCULOSKELETAL:  No edema; No deformity  SKIN: Warm and dry NEUROLOGIC:  Alert and oriented x  3 PSYCHIATRIC:  Normal affect   ASSESSMENT:    1. Chest pain of uncertain etiology   2. Coronary artery disease involving native coronary artery of native heart with angina pectoris ([place])   3. Cerebrovascular accident (CVA), unspecified mechanism ([place])    PLAN:    Chest pain: She reports she has been having chest pain all day, 9 out of 10 in intensity.  BP has been 200s over 110s.  EKG in clinic today shows normal sinus rhythm, Q waves in V1/2, nonspecific T wave flattening, no ST changes.  Presentation is concerning given her uncontrolled BP and history of coronary stents.  Given NTG in clinic with improvement in chest pain to 7/10 and BP improved to 160/100. -Presentation concerning for hypertensive urgency vs emergency, recommend evaluation in ED to rule out acute MI  CAD: Status post MI and stent x2, last in 2017.  Presents with acute chest pain as above.  Recommend ED evaluation to rule out MI as above.  Hypertension: On carvedilol 12 mg twice daily, clonidine 0.2 mg twice daily, doxazosin 2 mg daily, HCTZ 25 mg daily,valsartan 160 mg BID -BP uncontrolled, sent to ED for evaluation as above  CVA: on aspirin, statin  Hyperlipidemia: LDL 82 on 07/28/2020.  On atorvastatin 80 mg daily  CKD stage III: Creatinine 1.3 on 07/28/2020    Medication Adjustments/Labs and Tests Ordered: Current medicines are reviewed at length with the patient today.  Concerns regarding medicines are outlined above.  Orders Placed This Encounter  Procedures  . EKG 12-Lead   Meds ordered this encounter  Medications  . nitroGLYCERIN (NITROSTAT) SL tablet 0.4 mg    Patient Instructions  Proceed to ER for evaluation  Follow-Up: At [facility], you and your health needs are our priority.  As part of our continuing mission to provide you with exceptional heart care, we have created designated Provider Care Teams.  These Care Teams include your primary Cardiologist (physician) and Advanced Practice  Providers (APPs -  Physician Assistants and Nurse Practitioners) who all work together to provide you with the care you need, when you need it.  We recommend signing up for the patient portal called "MyChart".  Sign up information is provided on this After Visit Summary.  MyChart is used to connect with patients for Virtual Visits (Telemedicine).  Patients are able to view lab/test results, encounter notes, upcoming appointments, etc.  Non-urgent messages can be sent to your provider as well.   To learn more about what you can do with MyChart, go to [email or web address].    Your next appointment:   11/9 at 1:40 pm with Dr. Gardiner Rhyme     Signed, Donato Heinz, MD  08/11/2020 5:35 PM    [facility]

## 2020-08-11 ENCOUNTER — Ambulatory Visit: Payer: Medicare Other | Admitting: Cardiology

## 2020-08-11 ENCOUNTER — Emergency Department (HOSPITAL_COMMUNITY): Payer: Medicare Other

## 2020-08-11 ENCOUNTER — Encounter: Payer: Self-pay | Admitting: Cardiology

## 2020-08-11 ENCOUNTER — Inpatient Hospital Stay (HOSPITAL_COMMUNITY)
Admission: EM | Admit: 2020-08-11 | Discharge: 2020-08-17 | DRG: 305 | Disposition: A | Discharge status: Home / Self Care | Payer: Medicare Other | Attending: Internal Medicine | Admitting: Internal Medicine

## 2020-08-11 ENCOUNTER — Other Ambulatory Visit: Payer: Self-pay

## 2020-08-11 VITALS — BP 208/100 | HR 72 | Ht 68.0 in | Wt 235.2 lb

## 2020-08-11 DIAGNOSIS — I252 Old myocardial infarction: Secondary | ICD-10-CM

## 2020-08-11 DIAGNOSIS — K5900 Constipation, unspecified: Secondary | ICD-10-CM | POA: Diagnosis not present

## 2020-08-11 DIAGNOSIS — R269 Unspecified abnormalities of gait and mobility: Secondary | ICD-10-CM | POA: Diagnosis present

## 2020-08-11 DIAGNOSIS — I5032 Chronic diastolic (congestive) heart failure: Secondary | ICD-10-CM | POA: Diagnosis present

## 2020-08-11 DIAGNOSIS — R140 Abdominal distension (gaseous): Secondary | ICD-10-CM

## 2020-08-11 DIAGNOSIS — E876 Hypokalemia: Secondary | ICD-10-CM | POA: Diagnosis present

## 2020-08-11 DIAGNOSIS — I25119 Atherosclerotic heart disease of native coronary artery with unspecified angina pectoris: Secondary | ICD-10-CM

## 2020-08-11 DIAGNOSIS — Z823 Family history of stroke: Secondary | ICD-10-CM

## 2020-08-11 DIAGNOSIS — Z20822 Contact with and (suspected) exposure to covid-19: Secondary | ICD-10-CM | POA: Diagnosis present

## 2020-08-11 DIAGNOSIS — F32A Depression, unspecified: Secondary | ICD-10-CM | POA: Diagnosis present

## 2020-08-11 DIAGNOSIS — Z955 Presence of coronary angioplasty implant and graft: Secondary | ICD-10-CM

## 2020-08-11 DIAGNOSIS — I130 Hypertensive heart and chronic kidney disease with heart failure and stage 1 through stage 4 chronic kidney disease, or unspecified chronic kidney disease: Secondary | ICD-10-CM | POA: Diagnosis present

## 2020-08-11 DIAGNOSIS — I10 Essential (primary) hypertension: Secondary | ICD-10-CM

## 2020-08-11 DIAGNOSIS — I169 Hypertensive crisis, unspecified: Secondary | ICD-10-CM | POA: Diagnosis not present

## 2020-08-11 DIAGNOSIS — I160 Hypertensive urgency: Secondary | ICD-10-CM | POA: Diagnosis not present

## 2020-08-11 DIAGNOSIS — I248 Other forms of acute ischemic heart disease: Secondary | ICD-10-CM | POA: Diagnosis present

## 2020-08-11 DIAGNOSIS — J449 Chronic obstructive pulmonary disease, unspecified: Secondary | ICD-10-CM | POA: Diagnosis present

## 2020-08-11 DIAGNOSIS — Z6835 Body mass index (BMI) 35.0-35.9, adult: Secondary | ICD-10-CM

## 2020-08-11 DIAGNOSIS — Z8249 Family history of ischemic heart disease and other diseases of the circulatory system: Secondary | ICD-10-CM

## 2020-08-11 DIAGNOSIS — N281 Cyst of kidney, acquired: Secondary | ICD-10-CM | POA: Diagnosis present

## 2020-08-11 DIAGNOSIS — Z853 Personal history of malignant neoplasm of breast: Secondary | ICD-10-CM

## 2020-08-11 DIAGNOSIS — F419 Anxiety disorder, unspecified: Secondary | ICD-10-CM | POA: Diagnosis present

## 2020-08-11 DIAGNOSIS — I701 Atherosclerosis of renal artery: Secondary | ICD-10-CM | POA: Diagnosis present

## 2020-08-11 DIAGNOSIS — N1831 Chronic kidney disease, stage 3a: Secondary | ICD-10-CM | POA: Diagnosis present

## 2020-08-11 DIAGNOSIS — R079 Chest pain, unspecified: Secondary | ICD-10-CM | POA: Diagnosis not present

## 2020-08-11 DIAGNOSIS — Z87891 Personal history of nicotine dependence: Secondary | ICD-10-CM

## 2020-08-11 DIAGNOSIS — I2510 Atherosclerotic heart disease of native coronary artery without angina pectoris: Secondary | ICD-10-CM | POA: Diagnosis present

## 2020-08-11 DIAGNOSIS — E785 Hyperlipidemia, unspecified: Secondary | ICD-10-CM | POA: Diagnosis present

## 2020-08-11 DIAGNOSIS — Z79899 Other long term (current) drug therapy: Secondary | ICD-10-CM

## 2020-08-11 DIAGNOSIS — I639 Cerebral infarction, unspecified: Secondary | ICD-10-CM

## 2020-08-11 DIAGNOSIS — R519 Headache, unspecified: Secondary | ICD-10-CM | POA: Diagnosis present

## 2020-08-11 DIAGNOSIS — I69351 Hemiplegia and hemiparesis following cerebral infarction affecting right dominant side: Secondary | ICD-10-CM

## 2020-08-11 DIAGNOSIS — D3500 Benign neoplasm of unspecified adrenal gland: Secondary | ICD-10-CM | POA: Diagnosis present

## 2020-08-11 LAB — CBC
HCT: 47.8 % — ABNORMAL HIGH (ref 36.0–46.0)
Hemoglobin: 15.5 g/dL — ABNORMAL HIGH (ref 12.0–15.0)
MCH: 28.8 pg (ref 26.0–34.0)
MCHC: 32.4 g/dL (ref 30.0–36.0)
MCV: 88.8 fL (ref 80.0–100.0)
Platelets: 201 10*3/uL (ref 150–400)
RBC: 5.38 MIL/uL — ABNORMAL HIGH (ref 3.87–5.11)
RDW: 14.6 % (ref 11.5–15.5)
WBC: 9.7 10*3/uL (ref 4.0–10.5)
nRBC: 0 % (ref 0.0–0.2)

## 2020-08-11 LAB — BASIC METABOLIC PANEL
Anion gap: 12 (ref 5–15)
BUN: 21 mg/dL (ref 8–23)
CO2: 25 mmol/L (ref 22–32)
Calcium: 10.2 mg/dL (ref 8.9–10.3)
Chloride: 104 mmol/L (ref 98–111)
Creatinine, Ser: 1.14 mg/dL — ABNORMAL HIGH (ref 0.44–1.00)
GFR, Estimated: 51 mL/min — ABNORMAL LOW (ref >60)
Glucose, Bld: 99 mg/dL (ref 70–99)
Potassium: 3.3 mmol/L — ABNORMAL LOW (ref 3.5–5.1)
Sodium: 141 mmol/L (ref 135–145)

## 2020-08-11 LAB — BRAIN NATRIURETIC PEPTIDE: B Natriuretic Peptide: 65.3 pg/mL (ref 0.0–100.0)

## 2020-08-11 LAB — RESPIRATORY PANEL BY RT PCR (FLU A&B, COVID)
Influenza A by PCR: NEGATIVE
Influenza B by PCR: NEGATIVE
SARS Coronavirus 2 by RT PCR: NEGATIVE

## 2020-08-11 LAB — TROPONIN I (HIGH SENSITIVITY)
Troponin I (High Sensitivity): 6 ng/L (ref <18)
Troponin I (High Sensitivity): 6 ng/L (ref <18)

## 2020-08-11 MED ORDER — NITROGLYCERIN 0.4 MG SL SUBL
0.4000 mg | SUBLINGUAL_TABLET | SUBLINGUAL | Status: AC | PRN
  Administered 2020-08-11 (×2): 0.4 mg via SUBLINGUAL

## 2020-08-11 MED ORDER — LABETALOL HCL 5 MG/ML IV SOLN
20.0000 mg | Freq: Once | INTRAVENOUS | Status: AC
  Administered 2020-08-11: 20 mg via INTRAVENOUS
  Filled 2020-08-11: qty 4

## 2020-08-11 MED ORDER — POTASSIUM CHLORIDE CRYS ER 20 MEQ PO TBCR
40.0000 meq | EXTENDED_RELEASE_TABLET | Freq: Once | ORAL | Status: AC
  Administered 2020-08-11: 40 meq via ORAL
  Filled 2020-08-11: qty 2

## 2020-08-11 MED ORDER — HYDRALAZINE HCL 20 MG/ML IJ SOLN
10.0000 mg | Freq: Once | INTRAMUSCULAR | Status: AC
  Administered 2020-08-11: 10 mg via INTRAVENOUS
  Filled 2020-08-11: qty 1

## 2020-08-11 MED ORDER — NICARDIPINE HCL IN NACL 20-0.86 MG/200ML-% IV SOLN
3.0000 mg/h | INTRAVENOUS | Status: DC
  Administered 2020-08-11 – 2020-08-12 (×2): 5 mg/h via INTRAVENOUS
  Administered 2020-08-12: 7.5 mg/h via INTRAVENOUS
  Filled 2020-08-11 (×6): qty 200

## 2020-08-11 MED ORDER — POTASSIUM CHLORIDE 20 MEQ PO PACK
40.0000 meq | PACK | Freq: Once | ORAL | Status: DC
  Filled 2020-08-11: qty 2

## 2020-08-11 MED ORDER — PNEUMOCOCCAL VAC POLYVALENT 25 MCG/0.5ML IJ INJ
0.5000 mL | INJECTION | INTRAMUSCULAR | Status: DC
  Filled 2020-08-11: qty 0.5

## 2020-08-11 NOTE — H&P (Signed)
TRH H&P    Patient Demographics:    Jessica [name], is a 74 y.o. female  MRN: [id number]  DOB - 01-31-1946  Admit Date - 08/11/2020  Referring MD/NP/PA: Zenia Resides  Outpatient Primary MD for the patient is Cirigliano, Garvin Fila, DO  Patient coming from: cardiology office (home)  Chief complaint- Chest pain   HPI:    Jessica [name]  is a 74 y.o. female, with history of CVA, hypertension, hyperlipidemia, COPD, CHF, cancer, asthma presents to the ED with a chief complaint of "my cardiologist sent me." Patient reports that she did not sleep last night (chronic insomnia), but she did sleep from 7:00am - 8:30. When she woke up at 8:30am she had a nagging left sided chest pain. Patient reports that the pain did not wake her up out of sleep. Patient reports that she has had an MI, and this pain did not feel like that pain. She describes the pain as constant. Nothing made it better or worse. She did not have nitro at home to attempt to relieve the pain. She reports that she did take her baby aspirin this morning with no pain relief. She went to her cardiologist and her BP was noted to be 235/140. They gave her two nitro at the cardiology office. The first one had no effect, and the second improved her pain. Her cardiologist then told her that she needed to come in to the ED. She reports no associated worsening dyspnea (patient has chronic dyspnea 2/2 COPD), no nausea, no diaphoresis. She does admit to an associated throbbing headache on the left side that was markedly improved with the nitro.   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 On carvedilol, asa, statin, and ARB  Also on clonidine for HTN  In the ED  t 98.2, HR  50, RR 25, BP 165/138 92% WBC 9.7, Hgb 15.5 Chem panel = hypokalemia - 3.3 Cr mildly elevated at 1.14 BNP 65.3 Trop 6,6 CXR = No acute disease EKG without ischemic changes    Review of systems:    In addition to the HPI above,  No Fever-chills, Positive for right -sided headache, No changes with Vision or hearing, No problems swallowing food or Liquids, Positive for chest pain, but no Cough or Shortness of Breath, No Abdominal pain, No Nausea or Vomiting, bowel movements are regular, No Blood in stool or Urine, No dysuria, No new skin rashes or bruises, No new joints pains-aches,  No new weakness, tingling, numbness in any extremity, No recent weight gain or loss, No polyuria, polydypsia or polyphagia, No significant Mental Stressors.  All other systems reviewed and are negative.    Past History of the following :    Past Medical History:  Diagnosis Date  . Asthma   . Cancer ([place])   . CHF (congestive heart failure) ([place])   .  Chronic kidney disease   . COPD (chronic obstructive pulmonary disease) ([place])   . Hyperlipidemia   . Hypertension   . Stroke [facility])       Past Surgical History:  Procedure Laterality Date  . stomach surgery  1990      Social History:      Social History   Tobacco Use  . Smoking status: Former Smoker    Types: Cigarettes    Quit date: 2020    Years since quitting: 1.8  . Smokeless tobacco: Never Used  Substance Use Topics  . Alcohol use: Never       Family History :     Family History  Problem Relation Age of Onset  . Stroke Mother   . Heart disease Mother   . Hyperlipidemia Mother   . Heart disease Maternal Aunt   . Hyperlipidemia Maternal Aunt   . Stroke Maternal Aunt       Home Medications:   Prior to Admission medications   Medication Sig Start Date End Date Taking? Authorizing Provider  albuterol (ACCUNEB) 1.25 MG/3ML nebulizer solution Take 3 mLs (1.25 mg total) by nebulization every 6 (six) hours as needed  for wheezing. 06/28/20  Yes Mannam, Praveen, MD  albuterol (VENTOLIN HFA) 108 (90 Base) MCG/ACT inhaler Inhale 2 puffs into the lungs every 6 (six) hours as needed. Patient taking differently: Inhale 2 puffs into the lungs every 6 (six) hours as needed for wheezing.  07/21/20  Yes Cirigliano, Mary K, DO  anastrozole (ARIMIDEX) 1 MG tablet Take 1 tablet (1 mg total) by mouth daily. 07/21/20  Yes Cirigliano, Mary K, DO  atorvastatin (LIPITOR) 80 MG tablet Take 80 mg by mouth daily. 05/12/20  Yes [provider]  carvedilol (COREG) 25 MG tablet Take 25 mg by mouth 2 (two) times daily. 05/18/20  Yes [provider]  cloNIDine (CATAPRES) 0.2 MG tablet Take 0.2 mg by mouth 2 (two) times daily. 05/12/20  Yes [provider]  doxazosin (CARDURA) 2 MG tablet TAKE 1 TABLET(2 MG) BY MOUTH TWICE DAILY Patient taking differently: Take 2 mg by mouth 2 (two) times daily.  08/03/20  Yes Cirigliano, Mary K, DO  Fluticasone-Umeclidin-Vilant (TRELEGY ELLIPTA) 200-62.5-25 MCG/INH AEPB Inhale 1 puff into the lungs daily. 07/21/20  Yes Cirigliano, Mary K, DO  hydrochlorothiazide (HYDRODIURIL) 25 MG tablet Take 1 tablet (25 mg total) by mouth daily. 08/03/20  Yes Cirigliano, Mary K, DO  sertraline (ZOLOFT) 25 MG tablet Take 1 tablet (25 mg total) by mouth daily. 08/06/20  Yes Cirigliano, Mary K, DO  traZODone (DESYREL) 50 MG tablet Take 1 tablet (50 mg total) by mouth at bedtime as needed for sleep. 08/06/20  Yes Cirigliano, Mary K, DO  valsartan (DIOVAN) 160 MG tablet Take 1 tablet (160 mg total) by mouth 2 (two) times daily. 08/03/20  Yes Cirigliano, Mary K, DO  Vitamin D, Ergocalciferol, (DRISDOL) 1.25 MG (50000 UNIT) CAPS capsule Take 1 capsule (50,000 Units total) by mouth every 7 (seven) days. 08/02/20  Yes Cirigliano, Garvin Fila, DO     Allergies:    No Known Allergies   Physical Exam:   Vitals  Blood pressure (!) 187/153, pulse 75, temperature 98.2 F (36.8 C), resp. rate 18, height 5\' 8"   (1.727 m), weight 106.7 kg, SpO2 99 %.  1.  General: Lying supine in bed in no acute distress  2. Psychiatric: Mood and behavior normal for situation  3. Neurologic: Cranial nerves II through XII are grossly intact, moves all 4  extremities voluntarily, no acute focal deficit on limited exam, alert and oriented x3  4. HEENMT:  Head is atraumatic, normocephalic, pupils are reactive to light, neck is supple, trachea is midline, mucous membranes are dry  5. Respiratory : Lungs are clear to auscultation bilaterally, no wheeze no rhonchi no rales, no clubbing  6. Cardiovascular : Heart rate is normal, rhythm is regular, no murmurs rubs or gallops  7. Gastrointestinal:  Abdomen is soft, nondistended, nontender to palpation  8. Skin:  No acute lesion on limited skin exam  9.Musculoskeletal:  No peripheral edema or acute deformity    Data Review:    CBC Recent Labs  Lab 08/11/20 1648  WBC 9.7  HGB 15.5*  HCT 47.8*  PLT 201  MCV 88.8  MCH 28.8  MCHC 32.4  RDW 14.6   ------------------------------------------------------------------------------------------------------------------  Results for orders placed or performed during the hospital encounter of 08/11/20 (from the past 48 hour(s))  Basic metabolic panel     Status: Abnormal   Collection Time: 08/11/20  4:48 PM  Result Value Ref Range   Sodium 141 135 - 145 mmol/L   Potassium 3.3 (L) 3.5 - 5.1 mmol/L   Chloride 104 98 - 111 mmol/L   CO2 25 22 - 32 mmol/L   Glucose, Bld 99 70 - 99 mg/dL    Comment: Glucose reference range applies only to samples taken after fasting for at least 8 hours.   BUN 21 8 - 23 mg/dL   Creatinine, Ser 1.14 (H) 0.44 - 1.00 mg/dL   Calcium 10.2 8.9 - 10.3 mg/dL   GFR, Estimated 51 (L) >60 mL/min    Comment: (NOTE) Calculated using the CKD-EPI Creatinine Equation (2021)    Anion gap 12 5 - 15    Comment: Performed at [facility], [place] [address] [place], [place]  [address]  CBC     Status: Abnormal   Collection Time: 08/11/20  4:48 PM  Result Value Ref Range   WBC 9.7 4.0 - 10.5 K/uL   RBC 5.38 (H) 3.87 - 5.11 MIL/uL   Hemoglobin 15.5 (H) 12.0 - 15.0 g/dL   HCT 47.8 (H) 36 - 46 %   MCV 88.8 80.0 - 100.0 fL   MCH 28.8 26.0 - 34.0 pg   MCHC 32.4 30.0 - 36.0 g/dL   RDW 14.6 11.5 - 15.5 %   Platelets 201 150 - 400 K/uL   nRBC 0.0 0.0 - 0.2 %    Comment: Performed at [facility], [place] [address] [place], [place] [address]  Troponin I (High Sensitivity)     Status: None   Collection Time: 08/11/20  4:48 PM  Result Value Ref Range   Troponin I (High Sensitivity) 6 <18 ng/L    Comment: (NOTE) Elevated high sensitivity troponin I (hsTnI) values and significant  changes across serial measurements may suggest ACS but many other  chronic and acute conditions are known to elevate hsTnI results.  Refer to the "Links" section for chest pain algorithms and additional  guidance. Performed at [facility], [place] [address] [place],  [address]   Respiratory Panel by RT PCR (Flu A&B, Covid) - Nasopharyngeal Swab     Status: None   Collection Time: 08/11/20  5:22 PM   Specimen: Nasopharyngeal Swab  Result Value Ref Range   SARS Coronavirus 2 by RT PCR NEGATIVE NEGATIVE    Comment: (NOTE) SARS-CoV-2 target nucleic acids are NOT DETECTED.  The SARS-CoV-2 RNA is generally  detectable in upper respiratoy specimens during the acute phase of infection. The lowest concentration of SARS-CoV-2 viral copies this assay can detect is 131 copies/mL. A negative result does not preclude SARS-Cov-2 infection and should not be used as the sole basis for treatment or other patient management decisions. A negative result may occur with  improper specimen collection/handling, submission of specimen other than nasopharyngeal swab, presence of viral mutation(s) within the areas targeted by this assay, and inadequate number of viral  copies (<131 copies/mL). A negative result must be combined with clinical observations, patient history, and epidemiological information. The expected result is Negative.  Fact Sheet for Patients:  PinkCheek.be  Fact Sheet for Healthcare Providers:  GravelBags.it  This test is no t yet approved or cleared by the [place] FDA and  has been authorized for detection and/or diagnosis of SARS-CoV-2 by FDA under an Emergency Use Authorization (EUA). This EUA will remain  in effect (meaning this test can be used) for the duration of the COVID-19 declaration under Section 564(b)(1) of the Act, 21 U.S.C. section 360bbb-3(b)(1), unless the authorization is terminated or revoked sooner.     Influenza A by PCR NEGATIVE NEGATIVE   Influenza B by PCR NEGATIVE NEGATIVE    Comment: (NOTE) The Xpert Xpress SARS-CoV-2/FLU/RSV assay is intended as an aid in  the diagnosis of influenza from Nasopharyngeal swab specimens and  should not be used as a sole basis for treatment. Nasal washings and  aspirates are unacceptable for Xpert Xpress SARS-CoV-2/FLU/RSV  testing.  Fact Sheet for Patients: PinkCheek.be  Fact Sheet for Healthcare Providers: GravelBags.it  This test is not yet approved or cleared by the [place] FDA and  has been authorized for detection and/or diagnosis of SARS-CoV-2 by  FDA under an Emergency Use Authorization (EUA). This EUA will remain  in effect (meaning this test can be used) for the duration of the  Covid-19 declaration under Section 564(b)(1) of the Act, 21  U.S.C. section 360bbb-3(b)(1), unless the authorization is  terminated or revoked. Performed at [facility], [place] [address], [place], [place] [address]   Brain natriuretic peptide     Status: None   Collection Time: 08/11/20  5:25 PM  Result Value Ref Range   B  Natriuretic Peptide 65.3 0.0 - 100.0 pg/mL    Comment: Performed at [facility], [place] [address], [place], [place] [address]  Troponin I (High Sensitivity)     Status: None   Collection Time: 08/11/20  6:33 PM  Result Value Ref Range   Troponin I (High Sensitivity) 6 <18 ng/L    Comment: (NOTE) Elevated high sensitivity troponin I (hsTnI) values and significant  changes across serial measurements may suggest ACS but many other  chronic and acute conditions are known to elevate hsTnI results.  Refer to the "Links" section for chest pain algorithms and additional  guidance. Performed at [facility], [place] [address], [place], [place] [address]     Chemistries  Recent Labs  Lab 08/11/20 1648  NA 141  K 3.3*  CL 104  CO2 25  GLUCOSE 99  BUN 21  CREATININE 1.14*  CALCIUM 10.2   ------------------------------------------------------------------------------------------------------------------  ------------------------------------------------------------------------------------------------------------------ GFR: Estimated Creatinine Clearance: 57.9 mL/min (A) (by C-G formula based on SCr of 1.14 mg/dL (H)). Liver Function Tests: No results for input(s): AST, ALT, ALKPHOS, BILITOT, PROT, ALBUMIN in the last 168 hours. No results for input(s): LIPASE, AMYLASE in the last 168 hours. No results for input(s): AMMONIA  in the last 168 hours. Coagulation Profile: No results for input(s): INR, PROTIME in the last 168 hours. Cardiac Enzymes: No results for input(s): CKTOTAL, CKMB, CKMBINDEX, TROPONINI in the last 168 hours. BNP (last 3 results) No results for input(s): PROBNP in the last 8760 hours. HbA1C: No results for input(s): HGBA1C in the last 72 hours. CBG: No results for input(s): GLUCAP in the last 168 hours. Lipid Profile: No results for input(s): CHOL, HDL, LDLCALC, TRIG, CHOLHDL, LDLDIRECT in the last 72 hours. Thyroid Function  Tests: No results for input(s): TSH, T4TOTAL, FREET4, T3FREE, THYROIDAB in the last 72 hours. Anemia Panel: No results for input(s): VITAMINB12, FOLATE, FERRITIN, TIBC, IRON, RETICCTPCT in the last 72 hours.  --------------------------------------------------------------------------------------------------------------- Urine analysis: No results found for: COLORURINE, APPEARANCEUR, LABSPEC, PHURINE, GLUCOSEU, HGBUR, BILIRUBINUR, KETONESUR, PROTEINUR, UROBILINOGEN, NITRITE, LEUKOCYTESUR    Imaging Results:    DG Chest 2 View  Result Date: 08/11/2020 CLINICAL DATA:  Chest pain EXAM: CHEST - 2 VIEW COMPARISON:  06/28/2020, CT 07/14/2020 FINDINGS: Mild blunting of left CP angle, probable pleural scarring. Cardiomegaly. No focal consolidation. No pneumothorax IMPRESSION: No active cardiopulmonary disease. Cardiomegaly. Probable left CP angle scarring. Electronically Signed   By: Donavan Foil M.D.   On: 08/11/2020 17:11    My personal review of EKG: Rhythm NSR, Rate 76 /min, QTc 442 ,no Acute ST changes   Assessment & Plan:    Active Problems:   Hypertensive crisis   1. Hypertensive crisis 1. Blood pressure at cardiology office was 235/140 2. Labetalol given in ED brought heart rate down to 50, but blood pressure rebounded to 190s over 90 1 hour after dose given 3. Hydralazine 10 mg IV also given and blood pressure remained high 4. Started on nicardipine drip 5. Troponin is negative x2 6. Creatinine is at baseline 1.14 7. No neuro deficits on exam 8. Chest x-ray showed no active cardiopulmonary disease.  Cardiomegaly.  Probable left CP angle scarring. 9. BNP was 65.3 10. Ultimately no end organ damage 11. Continue home medications 12. Monitor on stepdown 2. Chest pain 1. Heart score 5 2. History of stent placed in 2010, and 2017 3. CVA in 1986 4. Former smoker, obese 5. Troponin negative x2, no acute EKG changes 6. Continue aspirin, statin, beta-blocker, ARB 7. Monitor on  telemetry 8. EKG and nitro for any further chest pain 3. Hypokalemia 1. Replace and recheck   DVT Prophylaxis-   Heparin- SCDs   AM Labs Ordered, also please review Full Orders  Family Communication: Admission, patients condition and plan of care including tests being ordered have been discussed with the patient and nephew who indicate understanding and agree with the plan and Code Status.  Code Status:  Full  Admission status: Observation Time spent in minutes : [place]

## 2020-08-11 NOTE — ED Triage Notes (Signed)
Pt arrived via walk in, was at her PCP prior to arrival, was c/o left sided chest pain, non-radiating, non reproducible that started x2 days ago while she was sleeping. Given NTG x2 by PCP with some relief. Pain from 10/10 down to 6/10

## 2020-08-11 NOTE — ED Provider Notes (Signed)
[facility] DEPT Provider Note   CSN: [id number] Arrival date & time: 08/11/20  1633     History Chief Complaint  Patient presents with  . Chest Pain    Jessica [name] is a 74 y.o. female.  74 year old female presents from her doctor's office after complaining of chest pain and chest pressure.  Is been waxing waning lasting for approximately 10 minutes or so.  She was given 2 nitroglycerin which did help her symptoms.  She was found to be profoundly hypertensive in the office.  States that she takes 40 types of blood pressure medication and has been compliant.  Denies any exertional symptoms.  No orthopnea or dyspnea exertion.  States her pain is primarily gone at this time        Past Medical History:  Diagnosis Date  . Asthma   . Cancer ([place])   . CHF (congestive heart failure) ([place])   . Chronic kidney disease   . COPD (chronic obstructive pulmonary disease) ([place])   . Hyperlipidemia   . Hypertension   . Stroke [facility])     Patient Active Problem List   Diagnosis Date Noted  . Poor sleep 08/06/2020  . Anxiety 08/06/2020  . Vitamin D deficiency 07/30/2020    Past Surgical History:  Procedure Laterality Date  . stomach surgery  1990     OB History   No obstetric history on file.     Family History  Problem Relation Age of Onset  . Stroke Mother   . Heart disease Mother   . Hyperlipidemia Mother   . Heart disease Maternal Aunt   . Hyperlipidemia Maternal Aunt   . Stroke Maternal Aunt     Social History   Tobacco Use  . Smoking status: Former Smoker    Types: Cigarettes    Quit date: 2020    Years since quitting: 1.8  . Smokeless tobacco: Never Used  Vaping Use  . Vaping Use: Never used  Substance Use Topics  . Alcohol use: Never  . Drug use: Never    Home Medications Prior to Admission medications   Medication Sig Start Date End Date Taking? Authorizing Provider  ADVAIR HFA 230-21 MCG/ACT inhaler Inhale 2 puffs  into the lungs 2 (two) times daily.  Patient not taking: Reported on 08/11/2020 05/12/20   [provider]  albuterol (ACCUNEB) 1.25 MG/3ML nebulizer solution Take 3 mLs (1.25 mg total) by nebulization every 6 (six) hours as needed for wheezing. 06/28/20   Mannam, Hart Robinsons, MD  albuterol (VENTOLIN HFA) 108 (90 Base) MCG/ACT inhaler Inhale 2 puffs into the lungs every 6 (six) hours as needed. 07/21/20   Cirigliano, Garvin Fila, DO  anastrozole (ARIMIDEX) 1 MG tablet Take 1 tablet (1 mg total) by mouth daily. 07/21/20   Cirigliano, Garvin Fila, DO  atorvastatin (LIPITOR) 80 MG tablet Take 80 mg by mouth daily. 05/12/20   [provider]  carvedilol (COREG) 25 MG tablet Take 25 mg by mouth 2 (two) times daily. 05/18/20   [provider]  cloNIDine (CATAPRES) 0.2 MG tablet Take 0.2 mg by mouth 2 (two) times daily. 05/12/20   [provider]  doxazosin (CARDURA) 2 MG tablet TAKE 1 TABLET(2 MG) BY MOUTH TWICE DAILY 08/03/20   Cirigliano, Garvin Fila, DO  Fluticasone-Umeclidin-Vilant (TRELEGY ELLIPTA) 200-62.5-25 MCG/INH AEPB Inhale 1 puff into the lungs daily. 07/21/20   Cirigliano, Garvin Fila, DO  hydrochlorothiazide (HYDRODIURIL) 25 MG tablet Take 1 tablet (25 mg total) by mouth daily. 08/03/20  Cirigliano, Mary K, DO  sertraline (ZOLOFT) 25 MG tablet Take 1 tablet (25 mg total) by mouth daily. 08/06/20   Cirigliano, Garvin Fila, DO  traZODone (DESYREL) 50 MG tablet Take 1 tablet (50 mg total) by mouth at bedtime as needed for sleep. 08/06/20   Cirigliano, Mary K, DO  valsartan (DIOVAN) 160 MG tablet Take 1 tablet (160 mg total) by mouth 2 (two) times daily. 08/03/20   Cirigliano, Garvin Fila, DO  Vitamin D, Ergocalciferol, (DRISDOL) 1.25 MG (50000 UNIT) CAPS capsule Take 1 capsule (50,000 Units total) by mouth every 7 (seven) days. 08/02/20   CiriglianoGarvin Fila, DO    Allergies    Patient has no known allergies.  Review of Systems   Review of Systems  All other systems reviewed and are  negative.   Physical Exam Updated Vital Signs BP (!) 237/135   Pulse 85   Temp 98.2 F (36.8 C)   Resp 12   Ht 1.727 m (5\' 8" )   Wt 106.7 kg   SpO2 94%   BMI 35.76 kg/m   Physical Exam Vitals and nursing note reviewed.  Constitutional:      General: She is not in acute distress.    Appearance: Normal appearance. She is well-developed. She is not toxic-appearing.  HENT:     Head: Normocephalic and atraumatic.  Eyes:     General: Lids are normal.     Conjunctiva/sclera: Conjunctivae normal.     Pupils: Pupils are equal, round, and reactive to light.  Neck:     Thyroid: No thyroid mass.     Trachea: No tracheal deviation.  Cardiovascular:     Rate and Rhythm: Normal rate and regular rhythm.     Heart sounds: Normal heart sounds. No murmur heard.  No gallop.   Pulmonary:     Effort: Pulmonary effort is normal. No respiratory distress.     Breath sounds: Normal breath sounds. No stridor. No decreased breath sounds, wheezing, rhonchi or rales.  Abdominal:     General: Bowel sounds are normal. There is no distension.     Palpations: Abdomen is soft.     Tenderness: There is no abdominal tenderness. There is no rebound.  Musculoskeletal:        General: No tenderness. Normal range of motion.     Cervical back: Normal range of motion and neck supple.  Skin:    General: Skin is warm and dry.     Findings: No abrasion or rash.  Neurological:     Mental Status: She is alert and oriented to person, place, and time.     GCS: GCS eye subscore is 4. GCS verbal subscore is 5. GCS motor subscore is 6.     Cranial Nerves: No cranial nerve deficit.     Sensory: No sensory deficit.  Psychiatric:        Speech: Speech normal.        Behavior: Behavior normal.     ED Results / Procedures / Treatments   Labs (all labs ordered are listed, but only abnormal results are displayed) Labs Reviewed  RESPIRATORY PANEL BY RT PCR (FLU A&B, COVID)  BASIC METABOLIC PANEL  CBC  TROPONIN I  (HIGH SENSITIVITY)    EKG EKG Interpretation  Date/Time:  Wednesday August 11 2020 16:47:39 EDT Ventricular Rate:  76 PR Interval:    QRS Duration: 102 QT Interval:  393 QTC Calculation: 442 R Axis:   67 Text Interpretation: Sinus rhythm Left atrial enlargement 12 Lead;  Mason-Likar No old tracing to compare Confirmed by Lacretia Leigh ([id number]) on 08/11/2020 5:23:05 PM   Radiology DG Chest 2 View  Result Date: 08/11/2020 CLINICAL DATA:  Chest pain EXAM: CHEST - 2 VIEW COMPARISON:  06/28/2020, CT 07/14/2020 FINDINGS: Mild blunting of left CP angle, probable pleural scarring. Cardiomegaly. No focal consolidation. No pneumothorax IMPRESSION: No active cardiopulmonary disease. Cardiomegaly. Probable left CP angle scarring. Electronically Signed   By: Donavan Foil M.D.   On: 08/11/2020 17:11    Procedures Procedures (including critical care time)  Medications Ordered in ED Medications  labetalol (NORMODYNE) injection 20 mg (has no administration in time range)    ED Course  I have reviewed the triage vital signs and the nursing notes.  Pertinent labs & imaging results that were available during my care of the patient were reviewed by me and considered in my medical decision making (see chart for details).    MDM Rules/Calculators/A&P                          Patient is EKG without acute ischemic changes.  Her delta troponin is negative.  She is also Covid negative.  Given labetalol 20 mg IV push for her severe hypertension which has improved.  She is currently pain-free.  She has a heart score of 5.  Given aspirin here.  Will admit for chest pain rule out  CRITICAL CARE Performed by: Leota Jacobsen Total critical care time: 50 minutes Critical care time was exclusive of separately billable procedures and treating other patients. Critical care was necessary to treat or prevent imminent or life-threatening deterioration. Critical care was time spent personally by me on the  following activities: development of treatment plan with patient and/or surrogate as well as nursing, discussions with consultants, evaluation of patient's response to treatment, examination of patient, obtaining history from patient or surrogate, ordering and performing treatments and interventions, ordering and review of laboratory studies, ordering and review of radiographic studies, pulse oximetry and re-evaluation of patient's condition.  Final Clinical Impression(s) / ED Diagnoses Final diagnoses:  None    Rx / DC Orders ED Discharge Orders    None       Lacretia Leigh, MD 08/11/20 1948

## 2020-08-11 NOTE — Patient Instructions (Signed)
Proceed to ER for evaluation  Follow-Up: At [facility], you and your health needs are our priority.  As part of our continuing mission to provide you with exceptional heart care, we have created designated Provider Care Teams.  These Care Teams include your primary Cardiologist (physician) and Advanced Practice Providers (APPs -  Physician Assistants and Nurse Practitioners) who all work together to provide you with the care you need, when you need it.  We recommend signing up for the patient portal called "MyChart".  Sign up information is provided on this After Visit Summary.  MyChart is used to connect with patients for Virtual Visits (Telemedicine).  Patients are able to view lab/test results, encounter notes, upcoming appointments, etc.  Non-urgent messages can be sent to your provider as well.   To learn more about what you can do with MyChart, go to [email or web address].    Your next appointment:   11/9 at 1:40 pm with Dr. Gardiner Rhyme

## 2020-08-12 ENCOUNTER — Encounter (HOSPITAL_COMMUNITY): Payer: Self-pay | Admitting: Family Medicine

## 2020-08-12 ENCOUNTER — Observation Stay (HOSPITAL_COMMUNITY): Payer: Medicare Other

## 2020-08-12 DIAGNOSIS — I701 Atherosclerosis of renal artery: Secondary | ICD-10-CM | POA: Diagnosis present

## 2020-08-12 DIAGNOSIS — I2583 Coronary atherosclerosis due to lipid rich plaque: Secondary | ICD-10-CM | POA: Diagnosis not present

## 2020-08-12 DIAGNOSIS — N281 Cyst of kidney, acquired: Secondary | ICD-10-CM | POA: Diagnosis present

## 2020-08-12 DIAGNOSIS — Z87891 Personal history of nicotine dependence: Secondary | ICD-10-CM | POA: Diagnosis not present

## 2020-08-12 DIAGNOSIS — I248 Other forms of acute ischemic heart disease: Secondary | ICD-10-CM | POA: Diagnosis present

## 2020-08-12 DIAGNOSIS — Z8249 Family history of ischemic heart disease and other diseases of the circulatory system: Secondary | ICD-10-CM | POA: Diagnosis not present

## 2020-08-12 DIAGNOSIS — K5900 Constipation, unspecified: Secondary | ICD-10-CM | POA: Diagnosis not present

## 2020-08-12 DIAGNOSIS — Z823 Family history of stroke: Secondary | ICD-10-CM | POA: Diagnosis not present

## 2020-08-12 DIAGNOSIS — Z79899 Other long term (current) drug therapy: Secondary | ICD-10-CM | POA: Diagnosis not present

## 2020-08-12 DIAGNOSIS — I2510 Atherosclerotic heart disease of native coronary artery without angina pectoris: Secondary | ICD-10-CM

## 2020-08-12 DIAGNOSIS — R519 Headache, unspecified: Secondary | ICD-10-CM | POA: Diagnosis present

## 2020-08-12 DIAGNOSIS — E785 Hyperlipidemia, unspecified: Secondary | ICD-10-CM | POA: Diagnosis present

## 2020-08-12 DIAGNOSIS — E7800 Pure hypercholesterolemia, unspecified: Secondary | ICD-10-CM | POA: Diagnosis not present

## 2020-08-12 DIAGNOSIS — I69351 Hemiplegia and hemiparesis following cerebral infarction affecting right dominant side: Secondary | ICD-10-CM | POA: Diagnosis not present

## 2020-08-12 DIAGNOSIS — J449 Chronic obstructive pulmonary disease, unspecified: Secondary | ICD-10-CM | POA: Diagnosis present

## 2020-08-12 DIAGNOSIS — F419 Anxiety disorder, unspecified: Secondary | ICD-10-CM | POA: Diagnosis present

## 2020-08-12 DIAGNOSIS — F32A Depression, unspecified: Secondary | ICD-10-CM | POA: Diagnosis present

## 2020-08-12 DIAGNOSIS — R079 Chest pain, unspecified: Secondary | ICD-10-CM

## 2020-08-12 DIAGNOSIS — I5032 Chronic diastolic (congestive) heart failure: Secondary | ICD-10-CM | POA: Diagnosis present

## 2020-08-12 DIAGNOSIS — I130 Hypertensive heart and chronic kidney disease with heart failure and stage 1 through stage 4 chronic kidney disease, or unspecified chronic kidney disease: Secondary | ICD-10-CM | POA: Diagnosis present

## 2020-08-12 DIAGNOSIS — I169 Hypertensive crisis, unspecified: Secondary | ICD-10-CM

## 2020-08-12 DIAGNOSIS — D3500 Benign neoplasm of unspecified adrenal gland: Secondary | ICD-10-CM | POA: Diagnosis present

## 2020-08-12 DIAGNOSIS — R269 Unspecified abnormalities of gait and mobility: Secondary | ICD-10-CM | POA: Diagnosis present

## 2020-08-12 DIAGNOSIS — I160 Hypertensive urgency: Secondary | ICD-10-CM | POA: Diagnosis present

## 2020-08-12 DIAGNOSIS — Z20822 Contact with and (suspected) exposure to covid-19: Secondary | ICD-10-CM | POA: Diagnosis present

## 2020-08-12 DIAGNOSIS — N1831 Chronic kidney disease, stage 3a: Secondary | ICD-10-CM | POA: Diagnosis present

## 2020-08-12 DIAGNOSIS — E876 Hypokalemia: Secondary | ICD-10-CM | POA: Diagnosis present

## 2020-08-12 LAB — TSH: TSH: 1.693 u[IU]/mL (ref 0.350–4.500)

## 2020-08-12 LAB — LIPID PANEL
Cholesterol: 128 mg/dL (ref 0–200)
HDL: 45 mg/dL (ref >40)
LDL Cholesterol: 64 mg/dL (ref 0–99)
Total CHOL/HDL Ratio: 2.8 RATIO
Triglycerides: 95 mg/dL (ref <150)
VLDL: 19 mg/dL (ref 0–40)

## 2020-08-12 LAB — BASIC METABOLIC PANEL
Anion gap: 13 (ref 5–15)
BUN: 19 mg/dL (ref 8–23)
CO2: 23 mmol/L (ref 22–32)
Calcium: 9.8 mg/dL (ref 8.9–10.3)
Chloride: 107 mmol/L (ref 98–111)
Creatinine, Ser: 1.04 mg/dL — ABNORMAL HIGH (ref 0.44–1.00)
GFR, Estimated: 57 mL/min — ABNORMAL LOW (ref >60)
Glucose, Bld: 134 mg/dL — ABNORMAL HIGH (ref 70–99)
Potassium: 3.4 mmol/L — ABNORMAL LOW (ref 3.5–5.1)
Sodium: 143 mmol/L (ref 135–145)

## 2020-08-12 LAB — MRSA PCR SCREENING: MRSA by PCR: NEGATIVE

## 2020-08-12 LAB — MAGNESIUM: Magnesium: 1.9 mg/dL (ref 1.7–2.4)

## 2020-08-12 MED ORDER — CHLORHEXIDINE GLUCONATE CLOTH 2 % EX PADS
6.0000 | MEDICATED_PAD | Freq: Every day | CUTANEOUS | Status: DC
  Administered 2020-08-13 – 2020-08-16 (×4): 6 via TOPICAL

## 2020-08-12 MED ORDER — ASPIRIN EC 81 MG PO TBEC
81.0000 mg | DELAYED_RELEASE_TABLET | Freq: Every day | ORAL | Status: DC
  Administered 2020-08-12 – 2020-08-17 (×6): 81 mg via ORAL
  Filled 2020-08-12 (×7): qty 1

## 2020-08-12 MED ORDER — POTASSIUM CHLORIDE CRYS ER 20 MEQ PO TBCR
40.0000 meq | EXTENDED_RELEASE_TABLET | Freq: Once | ORAL | Status: AC
  Administered 2020-08-12: 40 meq via ORAL
  Filled 2020-08-12: qty 2

## 2020-08-12 MED ORDER — ACETAMINOPHEN 325 MG PO TABS
650.0000 mg | ORAL_TABLET | ORAL | Status: DC | PRN
  Administered 2020-08-12 – 2020-08-15 (×6): 650 mg via ORAL
  Filled 2020-08-12 (×6): qty 2

## 2020-08-12 MED ORDER — FLUTICASONE-UMECLIDIN-VILANT 200-62.5-25 MCG/INH IN AEPB: 1.0000 | INHALATION_SPRAY | Freq: Every day | RESPIRATORY_TRACT | Status: DC

## 2020-08-12 MED ORDER — IRBESARTAN 300 MG PO TABS
300.0000 mg | ORAL_TABLET | Freq: Every day | ORAL | Status: DC
  Administered 2020-08-12 – 2020-08-17 (×6): 300 mg via ORAL
  Filled 2020-08-12 (×6): qty 1

## 2020-08-12 MED ORDER — HEPARIN SODIUM (PORCINE) 5000 UNIT/ML IJ SOLN
5000.0000 [IU] | Freq: Three times a day (TID) | INTRAMUSCULAR | Status: DC
  Administered 2020-08-12 – 2020-08-15 (×11): 5000 [IU] via SUBCUTANEOUS
  Filled 2020-08-12 (×7): qty 1

## 2020-08-12 MED ORDER — SERTRALINE HCL 25 MG PO TABS
25.0000 mg | ORAL_TABLET | Freq: Every day | ORAL | Status: DC
  Administered 2020-08-12 – 2020-08-17 (×6): 25 mg via ORAL
  Filled 2020-08-12 (×6): qty 1

## 2020-08-12 MED ORDER — MOMETASONE FURO-FORMOTEROL FUM 200-5 MCG/ACT IN AERO
2.0000 | INHALATION_SPRAY | Freq: Two times a day (BID) | RESPIRATORY_TRACT | Status: DC
  Administered 2020-08-12 – 2020-08-17 (×11): 2 via RESPIRATORY_TRACT
  Filled 2020-08-12: qty 8.8

## 2020-08-12 MED ORDER — NITROGLYCERIN 0.4 MG SL SUBL: 0.4000 mg | SUBLINGUAL_TABLET | SUBLINGUAL | Status: DC | PRN

## 2020-08-12 MED ORDER — HYDROCHLOROTHIAZIDE 25 MG PO TABS
25.0000 mg | ORAL_TABLET | Freq: Every day | ORAL | Status: DC
  Administered 2020-08-12: 25 mg via ORAL
  Filled 2020-08-12: qty 1

## 2020-08-12 MED ORDER — MOMETASONE FURO-FORMOTEROL FUM 200-5 MCG/ACT IN AERO: 2.0000 | INHALATION_SPRAY | Freq: Two times a day (BID) | RESPIRATORY_TRACT | Status: DC

## 2020-08-12 MED ORDER — MUPIROCIN 2 % EX OINT
1.0000 "application " | TOPICAL_OINTMENT | Freq: Two times a day (BID) | CUTANEOUS | Status: AC
  Administered 2020-08-12 – 2020-08-16 (×10): 1 via NASAL
  Filled 2020-08-12: qty 22

## 2020-08-12 MED ORDER — AMLODIPINE BESYLATE 5 MG PO TABS
5.0000 mg | ORAL_TABLET | Freq: Every day | ORAL | Status: DC
  Administered 2020-08-12: 5 mg via ORAL
  Filled 2020-08-12: qty 1

## 2020-08-12 MED ORDER — VITAMIN D (ERGOCALCIFEROL) 1.25 MG (50000 UNIT) PO CAPS
50000.0000 [IU] | ORAL_CAPSULE | ORAL | Status: DC
  Administered 2020-08-17: 50000 [IU] via ORAL
  Filled 2020-08-12: qty 1

## 2020-08-12 MED ORDER — CARVEDILOL 25 MG PO TABS
25.0000 mg | ORAL_TABLET | Freq: Two times a day (BID) | ORAL | Status: DC
  Administered 2020-08-12 – 2020-08-17 (×12): 25 mg via ORAL
  Filled 2020-08-12 (×2): qty 2
  Filled 2020-08-12: qty 1
  Filled 2020-08-12 (×7): qty 2
  Filled 2020-08-12: qty 1
  Filled 2020-08-12: qty 2

## 2020-08-12 MED ORDER — CHLORHEXIDINE GLUCONATE CLOTH 2 % EX PADS
6.0000 | MEDICATED_PAD | Freq: Every day | CUTANEOUS | Status: DC
  Administered 2020-08-12: 6 via TOPICAL

## 2020-08-12 MED ORDER — ATORVASTATIN CALCIUM 40 MG PO TABS
80.0000 mg | ORAL_TABLET | Freq: Every day | ORAL | Status: DC
  Administered 2020-08-12 – 2020-08-17 (×6): 80 mg via ORAL
  Filled 2020-08-12 (×6): qty 2

## 2020-08-12 MED ORDER — SPIRONOLACTONE 25 MG PO TABS
50.0000 mg | ORAL_TABLET | Freq: Every day | ORAL | Status: DC
  Administered 2020-08-12 – 2020-08-17 (×6): 50 mg via ORAL
  Filled 2020-08-12 (×6): qty 2

## 2020-08-12 MED ORDER — DOXAZOSIN MESYLATE 4 MG PO TABS
4.0000 mg | ORAL_TABLET | Freq: Every day | ORAL | Status: DC
  Administered 2020-08-12 – 2020-08-17 (×6): 4 mg via ORAL
  Filled 2020-08-12: qty 1
  Filled 2020-08-12: qty 2
  Filled 2020-08-12 (×3): qty 1
  Filled 2020-08-12: qty 4
  Filled 2020-08-12 (×2): qty 1

## 2020-08-12 MED ORDER — ALBUTEROL SULFATE (2.5 MG/3ML) 0.083% IN NEBU: 2.5000 mg | INHALATION_SOLUTION | Freq: Four times a day (QID) | RESPIRATORY_TRACT | Status: DC | PRN

## 2020-08-12 MED ORDER — ONDANSETRON HCL 4 MG/2ML IJ SOLN: 4.0000 mg | Freq: Four times a day (QID) | INTRAMUSCULAR | Status: DC | PRN

## 2020-08-12 MED ORDER — TRAZODONE HCL 50 MG PO TABS: 50.0000 mg | ORAL_TABLET | Freq: Every evening | ORAL | Status: DC | PRN

## 2020-08-12 MED ORDER — UMECLIDINIUM BROMIDE 62.5 MCG/INH IN AEPB
1.0000 | INHALATION_SPRAY | Freq: Every day | RESPIRATORY_TRACT | Status: DC
  Administered 2020-08-12 – 2020-08-17 (×6): 1 via RESPIRATORY_TRACT
  Filled 2020-08-12: qty 7

## 2020-08-12 MED ORDER — CLONIDINE HCL 0.1 MG PO TABS
0.2000 mg | ORAL_TABLET | Freq: Two times a day (BID) | ORAL | Status: DC
  Administered 2020-08-12 – 2020-08-14 (×6): 0.2 mg via ORAL
  Filled 2020-08-12 (×6): qty 2

## 2020-08-12 NOTE — Progress Notes (Signed)
PROGRESS NOTE  Jessica [name] ELF:[id number] DOB: 23-Feb-1946 DOA: 08/11/2020 PCP: Ronnald Nian, DO  HPI/Recap of past 24 hours: Jessica [name]  is a 74 y.o. female, with history of CVA, hypertension, hyperlipidemia, COPD, CHF, breast cancer, asthma, CAD s/p PCI with stent x 2, who presents to the ED with a chief complaint of chest pain that awakened her on the morning of her presentation.  She went to her cardiologist first and there she was found to be severely hypertensive.  Due to concern for possible ACS she was sent to the ED for further evaluation.  She was started on nicardipine drip in the ED with improvement of her uncontrolled HTN.  Troponin S negative x2.  No evidence of acute ischemia on 12 lead EKG.  Hospital course complicated by severe headache stating it feels like the headache she had prior to her diagnosis of stroke.  CT head non contrast pending.    08/12/20: Reports severe headache 10/10.  BP is still high this AM despite nicardipine drip.  Denies noncompliance to her medications prior to admission.   Assessment/Plan: Active Problems:   Hypertensive crisis  Chest pain rule out ACS Initially presented at her cardiologist office with complaints of chest pain, found to be severely hypertensive and was sent to the ED for further evaluation. Troponin S- x2 No evidence of acute ischemia on twelve-lead EKG Started on nicardipine drip for hypertensive crisis Cardiology consult Continue telemetry monitoring  Coronary artery disease status post PCI with stent x2 History of MI Follows with cardiology outpatient Currently denies chest pain, main complaint is headache, severe. Continue antiplatelet, statin, and beta-blocker  Severe headache with prior history of CVA and ongoing management of hypertensive crisis BP is still uncontrolled despite nicardipine drip and p.o. home antihypertensives CT head without contrast ordered to further assess, follow  results Continue to closely monitor vital signs  Hypertensive crisis Presented with severely elevated BP Currently on nicardipine drip, continue Continue home p.o. antihypertensives: Coreg 25 mg twice daily, clonidine 0.2 mg twice daily, doxazosin 4 mg daily, HCTZ 25 mg daily, ibesartan 300 mg daily.  History of CVA Continue aspirin and statin Mild residual right lower extremity weakness States she ambulates without assistance  Chronic anxiety/depression Mood Continue home Zoloft  History of asthma Stable Continue home bronchodilators  Hyperlipidemia Continue home Lipitor 80 mg daily  CKD 3 8 Baseline creatinine appears to be 1.0 with GFR 57 She is at her baseline Monitor urine output Avoid nephrotoxins  Hypokalemia Serum potassium 3.4 Repleted Magnesium 1.9.    Code Status: Full code  Family Communication: None at bedside  Disposition Plan: Home likely with home health services RN   Consultants:  Cardiology  Procedures:  None  Antimicrobials:  None  DVT prophylaxis: Subcu Lovenox daily  Status is: Inpatient    Dispo:  Patient From: Home  Planned Disposition: Home  Expected discharge date: 08/13/20  Medically stable for discharge: No, ongoing management of severe headache, and hypertensive crisis.         Objective: Vitals:   08/12/20 0600 08/12/20 0800 08/12/20 0834 08/12/20 0836  BP: 131/66 (!) 186/89    Pulse: 73 78    Resp: (!) 25 (!) 27    Temp:  97.8 F (36.6 C)    TempSrc:  Axillary    SpO2: 94% 94% 98% 99%  Weight:      Height:       No intake or output data in the 24 hours ending 08/12/20 1057 Filed  Weights   08/11/20 1709  Weight: 106.7 kg    Exam:  . General: 74 y.o. year-old female well developed well nourished in no acute distress.  Alert and oriented x3.  Appears uncomfortable due to severe headache. . Cardiovascular: Regular rate and rhythm with no rubs or gallops.  No thyromegaly or JVD noted.    Marland Kitchen Respiratory: Clear to auscultation with no wheezes or rales. Good inspiratory effort. . Abdomen: Soft nontender nondistended with normal bowel sounds x4 quadrants. . Musculoskeletal: No lower extremity edema. 2/4 pulses in all 4 extremities. . Skin: No ulcerative lesions noted or rashes, . Psychiatry: Mood is appropriate for condition and setting   Data Reviewed: CBC: Recent Labs  Lab 08/11/20 1648  WBC 9.7  HGB 15.5*  HCT 47.8*  MCV 88.8  PLT 580   Basic Metabolic Panel: Recent Labs  Lab 08/11/20 1648 08/12/20 0505  NA 141 143  K 3.3* 3.4*  CL 104 107  CO2 25 23  GLUCOSE 99 134*  BUN 21 19  CREATININE 1.14* 1.04*  CALCIUM 10.2 9.8  MG  --  1.9   GFR: Estimated Creatinine Clearance: 63.4 mL/min (A) (by C-G formula based on SCr of 1.04 mg/dL (H)). Liver Function Tests: No results for input(s): AST, ALT, ALKPHOS, BILITOT, PROT, ALBUMIN in the last 168 hours. No results for input(s): LIPASE, AMYLASE in the last 168 hours. No results for input(s): AMMONIA in the last 168 hours. Coagulation Profile: No results for input(s): INR, PROTIME in the last 168 hours. Cardiac Enzymes: No results for input(s): CKTOTAL, CKMB, CKMBINDEX, TROPONINI in the last 168 hours. BNP (last 3 results) No results for input(s): PROBNP in the last 8760 hours. HbA1C: No results for input(s): HGBA1C in the last 72 hours. CBG: No results for input(s): GLUCAP in the last 168 hours. Lipid Profile: Recent Labs    08/12/20 0505  CHOL 128  HDL 45  LDLCALC 64  TRIG 95  CHOLHDL 2.8   Thyroid Function Tests: No results for input(s): TSH, T4TOTAL, FREET4, T3FREE, THYROIDAB in the last 72 hours. Anemia Panel: No results for input(s): VITAMINB12, FOLATE, FERRITIN, TIBC, IRON, RETICCTPCT in the last 72 hours. Urine analysis: No results found for: COLORURINE, APPEARANCEUR, LABSPEC, PHURINE, GLUCOSEU, HGBUR, BILIRUBINUR, KETONESUR, PROTEINUR, UROBILINOGEN, NITRITE, LEUKOCYTESUR Sepsis  Labs: @LABRCNTIP (procalcitonin:4,lacticidven:4)  ) Recent Results (from the past 240 hour(s))  Respiratory Panel by RT PCR (Flu A&B, Covid) - Nasopharyngeal Swab     Status: None   Collection Time: 08/11/20  5:22 PM   Specimen: Nasopharyngeal Swab  Result Value Ref Range Status   SARS Coronavirus 2 by RT PCR NEGATIVE NEGATIVE Final    Comment: (NOTE) SARS-CoV-2 target nucleic acids are NOT DETECTED.  The SARS-CoV-2 RNA is generally detectable in upper respiratoy specimens during the acute phase of infection. The lowest concentration of SARS-CoV-2 viral copies this assay can detect is 131 copies/mL. A negative result does not preclude SARS-Cov-2 infection and should not be used as the sole basis for treatment or other patient management decisions. A negative result may occur with  improper specimen collection/handling, submission of specimen other than nasopharyngeal swab, presence of viral mutation(s) within the areas targeted by this assay, and inadequate number of viral copies (<131 copies/mL). A negative result must be combined with clinical observations, patient history, and epidemiological information. The expected result is Negative.  Fact Sheet for Patients:  PinkCheek.be  Fact Sheet for Healthcare Providers:  GravelBags.it  This test is no t yet approved or cleared by the  [place] Architectural [employer]ologist and  has been authorized for detection and/or diagnosis of SARS-CoV-2 by FDA under an Print production planner (EUA). This EUA will remain  in effect (meaning this test can be used) for the duration of the COVID-19 declaration under Section 564(b)(1) of the Act, 21 U.S.C. section 360bbb-3(b)(1), unless the authorization is terminated or revoked sooner.     Influenza A by PCR NEGATIVE NEGATIVE Final   Influenza B by PCR NEGATIVE NEGATIVE Final    Comment: (NOTE) The Xpert Xpress SARS-CoV-2/FLU/RSV assay is intended as an  aid in  the diagnosis of influenza from Nasopharyngeal swab specimens and  should not be used as a sole basis for treatment. Nasal washings and  aspirates are unacceptable for Xpert Xpress SARS-CoV-2/FLU/RSV  testing.  Fact Sheet for Patients: PinkCheek.be  Fact Sheet for Healthcare Providers: GravelBags.it  This test is not yet approved or cleared by the [place] FDA and  has been authorized for detection and/or diagnosis of SARS-CoV-2 by  FDA under an Emergency Use Authorization (EUA). This EUA will remain  in effect (meaning this test can be used) for the duration of the  Covid-19 declaration under Section 564(b)(1) of the Act, 21  U.S.C. section 360bbb-3(b)(1), unless the authorization is  terminated or revoked. Performed at [facility], [place] [address] [place], [place] [address]       Studies: DG Chest 2 View  Result Date: 08/11/2020 CLINICAL DATA:  Chest pain EXAM: CHEST - 2 VIEW COMPARISON:  06/28/2020, CT 07/14/2020 FINDINGS: Mild blunting of left CP angle, probable pleural scarring. Cardiomegaly. No focal consolidation. No pneumothorax IMPRESSION: No active cardiopulmonary disease. Cardiomegaly. Probable left CP angle scarring. Electronically Signed   By: Donavan Foil M.D.   On: 08/11/2020 17:11    Scheduled Meds: . aspirin EC  81 mg Oral Daily  . atorvastatin  80 mg Oral Daily  . carvedilol  25 mg Oral BID  . Chlorhexidine Gluconate Cloth  6 each Topical Q0600  . Chlorhexidine Gluconate Cloth  6 each Topical Daily  . cloNIDine  0.2 mg Oral BID  . doxazosin  4 mg Oral Daily  . heparin  5,000 Units Subcutaneous Q8H  . hydrochlorothiazide  25 mg Oral Daily  . irbesartan  300 mg Oral Daily  . mometasone-formoterol  2 puff Inhalation BID   And  . umeclidinium bromide  1 puff Inhalation Daily  . mupirocin ointment  1 application Nasal BID  . pneumococcal 23 valent vaccine  0.5 mL  Intramuscular Tomorrow-1000  . sertraline  25 mg Oral Daily  . [START ON 08/17/2020] Vitamin D (Ergocalciferol)  50,000 Units Oral Q7 days    Continuous Infusions: . niCARDipine 5 mg/hr (08/12/20 0759)     LOS: 0 days     Kayleen Memos, MD [facility] Pager [phone]  If 7PM-7AM, please contact night-coverage [email or web address] Password [facility] 08/12/2020, 10:57 AM

## 2020-08-12 NOTE — Consult Note (Addendum)
Cardiology Consultation:   Patient ID: Shanasia Ibrahim MRN: [id number]; DOB: 03/05/1946  Admit date: 08/11/2020 Date of Consult: 08/12/2020  Primary Care Provider: Ronnald Nian, DO Primary Cardiologist: Oswaldo Milian, MD Primary Electrophysiologist:  None    Patient Profile:   Jessica [name] is a 74 y.o. female with a hx of CVA, COPD, HTN, HLD, breast CA and ASCAD s/p remote PCI who is being seen today for the evaluation of chest pain in the setting of hypertensive urgency at the request of Dr. Francia Greaves.  History of Present Illness:   Ms. Braddy is a 74 y.o. female with a hx of CVA, COPD, hypertension, hyperlipidemia, CKD stage 3a, breast cancer, CAD status post PCI.  She moved to [place] from [place] in July 2021.  Reports a history of CAD status post MI in stent x2.  Reports first MI was in 2010, had stent at [facility].  Second stent was DES to LAD in 2017.  She was seen by Dr. Gardiner Rhyme yesterday for evaluation of chest pain.  She says that she awakened with severe chest pain which persisted throughout the day along with a severe HA.  Her SBP had been up in the 200/100's.  Her CP was rated a 9/10 in severity but not like the CP she had with her MI.   In office her EKG showed NSR with septal infarct and T wave flattening with no acute ST changes of ischemia.  It was felt that likely she had hypertensive urgency with demand ischemia causing CP.  She was given SL NTG in the office with improvement in CP to 7/10 and BP improved to 160/164mmHg.  She was then transferred to [facility] ER and admitted by [facility].    In ER hsTrop normal at 6>6 and BNp normal at 65.  SCr at baseline at 1.14 and hypokalemic at 3.3.  Cxray showed NAD.  She currently denies any further CP, SOB, DOE, LE edema, dizziness, palpitations, PND or orthopnea.  Her HA has improved. Given IV labetalol in ER and IV Hydralazine and then started on nicardipine gtt.  Cardiology is now asked to consult for management of BP and  CP.    Past Medical History:  Diagnosis Date  . Asthma   . Cancer ([place])   . CHF (congestive heart failure) ([place])   . Chronic kidney disease   . COPD (chronic obstructive pulmonary disease) ([place])   . Hyperlipidemia   . Hypertension   . Stroke [facility])     Past Surgical History:  Procedure Laterality Date  . stomach surgery  1990     Home Medications:  Prior to Admission medications   Medication Sig Start Date End Date Taking? Authorizing Provider  albuterol (ACCUNEB) 1.25 MG/3ML nebulizer solution Take 3 mLs (1.25 mg total) by nebulization every 6 (six) hours as needed for wheezing. 06/28/20  Yes Mannam, Praveen, MD  albuterol (VENTOLIN HFA) 108 (90 Base) MCG/ACT inhaler Inhale 2 puffs into the lungs every 6 (six) hours as needed. Patient taking differently: Inhale 2 puffs into the lungs every 6 (six) hours as needed for wheezing.  07/21/20  Yes Cirigliano, Mary K, DO  anastrozole (ARIMIDEX) 1 MG tablet Take 1 tablet (1 mg total) by mouth daily. 07/21/20  Yes Cirigliano, Mary K, DO  atorvastatin (LIPITOR) 80 MG tablet Take 80 mg by mouth daily. 05/12/20  Yes [provider]  carvedilol (COREG) 25 MG tablet Take 25 mg by mouth 2 (two) times daily. 05/18/20  Yes [provider]  cloNIDine (  CATAPRES) 0.2 MG tablet Take 0.2 mg by mouth 2 (two) times daily. 05/12/20  Yes [provider]  doxazosin (CARDURA) 2 MG tablet TAKE 1 TABLET(2 MG) BY MOUTH TWICE DAILY Patient taking differently: Take 2 mg by mouth 2 (two) times daily.  08/03/20  Yes Cirigliano, Mary K, DO  Fluticasone-Umeclidin-Vilant (TRELEGY ELLIPTA) 200-62.5-25 MCG/INH AEPB Inhale 1 puff into the lungs daily. 07/21/20  Yes Cirigliano, Mary K, DO  hydrochlorothiazide (HYDRODIURIL) 25 MG tablet Take 1 tablet (25 mg total) by mouth daily. 08/03/20  Yes Cirigliano, Mary K, DO  sertraline (ZOLOFT) 25 MG tablet Take 1 tablet (25 mg total) by mouth daily. 08/06/20  Yes Cirigliano, Mary K, DO  traZODone (DESYREL) 50 MG  tablet Take 1 tablet (50 mg total) by mouth at bedtime as needed for sleep. 08/06/20  Yes Cirigliano, Mary K, DO  valsartan (DIOVAN) 160 MG tablet Take 1 tablet (160 mg total) by mouth 2 (two) times daily. 08/03/20  Yes Cirigliano, Mary K, DO  Vitamin D, Ergocalciferol, (DRISDOL) 1.25 MG (50000 UNIT) CAPS capsule Take 1 capsule (50,000 Units total) by mouth every 7 (seven) days. 08/02/20  Yes CiriglianoGarvin Fila, DO    Inpatient Medications: Scheduled Meds: . aspirin EC  81 mg Oral Daily  . atorvastatin  80 mg Oral Daily  . carvedilol  25 mg Oral BID  . Chlorhexidine Gluconate Cloth  6 each Topical Q0600  . Chlorhexidine Gluconate Cloth  6 each Topical Daily  . cloNIDine  0.2 mg Oral BID  . doxazosin  4 mg Oral Daily  . heparin  5,000 Units Subcutaneous Q8H  . hydrochlorothiazide  25 mg Oral Daily  . irbesartan  300 mg Oral Daily  . mometasone-formoterol  2 puff Inhalation BID   And  . umeclidinium bromide  1 puff Inhalation Daily  . mupirocin ointment  1 application Nasal BID  . pneumococcal 23 valent vaccine  0.5 mL Intramuscular Tomorrow-1000  . sertraline  25 mg Oral Daily  . [START ON 08/17/2020] Vitamin D (Ergocalciferol)  50,000 Units Oral Q7 days   Continuous Infusions: . niCARDipine 5 mg/hr (08/12/20 0759)   PRN Meds: acetaminophen, albuterol, nitroGLYCERIN, ondansetron (ZOFRAN) IV, traZODone  Allergies:   No Known Allergies  Social History:   Social History   Socioeconomic History  . Marital status: Widowed    Spouse name: Not on file  . Number of children: Not on file  . Years of education: Not on file  . Highest education level: Not on file  Occupational History  . Not on file  Tobacco Use  . Smoking status: Former Smoker    Types: Cigarettes    Quit date: 2020    Years since quitting: 1.8  . Smokeless tobacco: Never Used  Vaping Use  . Vaping Use: Never used  Substance and Sexual Activity  . Alcohol use: Never  . Drug use: Never  . Sexual activity:  Not Currently  Other Topics Concern  . Not on file  Social History Narrative  . Not on file   Social Determinants of Health   Financial Resource Strain:   . Difficulty of Paying Living Expenses: Not on file  Food Insecurity:   . Worried About Charity fundraiser in the Last Year: Not on file  . Ran Out of Food in the Last Year: Not on file  Transportation Needs:   . Lack of Transportation (Medical): Not on file  . Lack of Transportation (Non-Medical): Not on file  Physical Activity:   .  Days of Exercise per Week: Not on file  . Minutes of Exercise per Session: Not on file  Stress:   . Feeling of Stress : Not on file  Social Connections:   . Frequency of Communication with Friends and Family: Not on file  . Frequency of Social Gatherings with Friends and Family: Not on file  . Attends Religious Services: Not on file  . Active Member of Clubs or Organizations: Not on file  . Attends Archivist Meetings: Not on file  . Marital Status: Not on file  Intimate Partner Violence:   . Fear of Current or Ex-Partner: Not on file  . Emotionally Abused: Not on file  . Physically Abused: Not on file  . Sexually Abused: Not on file    Family History:    Family History  Problem Relation Age of Onset  . Stroke Mother   . Heart disease Mother   . Hyperlipidemia Mother   . Heart disease Maternal Aunt   . Hyperlipidemia Maternal Aunt   . Stroke Maternal Aunt      ROS:  Please see the history of present illness.   All other ROS reviewed and negative.     Physical Exam/Data:   Vitals:   08/12/20 0600 08/12/20 0800 08/12/20 0834 08/12/20 0836  BP: 131/66 (!) 186/89    Pulse: 73 78    Resp: (!) 25 (!) 27    Temp:  97.8 F (36.6 C)    TempSrc:  Axillary    SpO2: 94% 94% 98% 99%  Weight:      Height:       No intake or output data in the 24 hours ending 08/12/20 1239 Last 3 Weights 08/11/2020 08/11/2020 08/06/2020  Weight (lbs) 235 lb 3.2 oz 235 lb 3.2 oz 236 lb    Weight (kg) 106.686 kg 106.686 kg 107.049 kg     Body mass index is 35.76 kg/m.  General:  Well nourished, well developed, in no acute distress HEENT: normal Lymph: no adenopathy Neck: no JVD Endocrine:  No thryomegaly Vascular: No carotid bruits; FA pulses 2+ bilaterally without bruits  Cardiac:  normal S1, S2; RRR; no murmur  Lungs:  clear to auscultation bilaterally, no wheezing, rhonchi or rales  Abd: soft, nontender, no hepatomegaly  Ext: no edema Musculoskeletal:  No deformities, BUE and BLE strength normal and equal Skin: warm and dry  Neuro:  CNs 2-12 intact, no focal abnormalities noted Psych:  Normal affect   EKG:  The EKG was personally reviewed and demonstrates:  NSR with LAE and nonspecific ST abnormality Telemetry:  Telemetry was personally reviewed and demonstrates:  NSR  Relevant CV Studies: none  Laboratory Data:  High Sensitivity Troponin:   Recent Labs  Lab 08/11/20 1648 08/11/20 1833  TROPONINIHS 6 6     Chemistry Recent Labs  Lab 08/11/20 1648 08/12/20 0505  NA 141 143  K 3.3* 3.4*  CL 104 107  CO2 25 23  GLUCOSE 99 134*  BUN 21 19  CREATININE 1.14* 1.04*  CALCIUM 10.2 9.8  GFRNONAA 51* 57*  ANIONGAP 12 13    No results for input(s): PROT, ALBUMIN, AST, ALT, ALKPHOS, BILITOT in the last 168 hours. Hematology Recent Labs  Lab 08/11/20 1648  WBC 9.7  RBC 5.38*  HGB 15.5*  HCT 47.8*  MCV 88.8  MCH 28.8  MCHC 32.4  RDW 14.6  PLT 201   BNP Recent Labs  Lab 08/11/20 1725  BNP 65.3    DDimer No  results for input(s): DDIMER in the last 168 hours.   Radiology/Studies:  DG Chest 2 View  Result Date: 08/11/2020 CLINICAL DATA:  Chest pain EXAM: CHEST - 2 VIEW COMPARISON:  06/28/2020, CT 07/14/2020 FINDINGS: Mild blunting of left CP angle, probable pleural scarring. Cardiomegaly. No focal consolidation. No pneumothorax IMPRESSION: No active cardiopulmonary disease. Cardiomegaly. Probable left CP angle scarring. Electronically  Signed   By: Donavan Foil M.D.   On: 08/11/2020 17:11   CT HEAD WO CONTRAST  Result Date: 08/12/2020 CLINICAL DATA:  Headache EXAM: CT HEAD WITHOUT CONTRAST TECHNIQUE: Contiguous axial images were obtained from the base of the skull through the vertex without intravenous contrast. COMPARISON:  None. FINDINGS: Brain: There is no acute intracranial hemorrhage, mass effect, or edema. Gray-white differentiation is preserved. There is no extra-axial fluid collection. Prominence of the ventricles and sulci reflects minor generalized parenchymal volume loss. Confluent areas of hypoattenuation in the supratentorial white matter are nonspecific may reflect advanced chronic microvascular ischemic changes. Small chronic infarcts or prominent perivascular spaces at the level of the inferior basal ganglia bilaterally. Vascular: There is atherosclerotic calcification at the skull base. Skull: Calvarium is unremarkable. Sinuses/Orbits: No acute finding. Other: None. IMPRESSION: No acute intracranial abnormality. Advanced chronic microvascular ischemic changes. Electronically Signed   By: Macy Mis M.D.   On: 08/12/2020 10:58       HEAR Score (for undifferentiated chest pain):  HEAR Score: 6    Assessment and Plan:   1. Chest pain -this occurred in the setting of hypertensive urgency with BPs > 200/159mmHg -CP resolved with improvement in BP -hsTrop normal x 2 and EKG with no acute ischemic changes -I do not think this represents an ACS -suspect demand ischemia in setting of severely elevated BP -2D echo pending - if LVF normal then can consider outpt nuclear stress test  2.  Hypertensive urgency -BP in office > 200/100's -BP improved some on nicardipine gtt but still elevated at 186/34mmHg -Continue Irbesartan 300mg  daily, Carvedilol 25mg  BID, Clonidine 0.2mg  BID, doxazosin 4mg  daily -change HCTZ to spironolactone 50mg  daily (should also help with hypokalemia) -change Nicardipine gtt to amlodipine  5mg  daily and titrate as needed for HTN -check renal artery dopplers to r/u RAS -check aldo, renin and PRA (may be falsely elevated in setting of ARB) -check TSH -24 hour urine for catecholamines, dopamine, metanephrines, VMA, cortisol -she had a sleep study ordered in Sept but never followed through so will order outpt sleep study  3.  ASCAD -s/p remote PCI x 2 -has not had any anginal CP -CP she was admitted with was not like her angina in the past -Continue ASA, statin and BB      For questions or updates, please contact [place] Please consult [email or web address] for contact info under     Signed, Fransico Him, MD  08/12/2020 12:39 PM

## 2020-08-12 NOTE — TOC Initial Note (Signed)
Transition of Care [facility]) - Initial/Assessment Note    Patient Details  Name: Jessica [name] MRN: [id number] Date of Birth: 02/27/1946  Transition of Care [facility]) CM/SW Contact:    Leeroy Cha, RN Phone Number: 08/12/2020, 7:25 AM  Clinical Narrative:                 74 y.o. female, with history of CVA, hypertension, hyperlipidemia, COPD, CHF, cancer, asthma presents to the ED with a chief complaint of "my cardiologist sent me." Patient reports that she did not sleep last night (chronic insomnia), but she did sleep from 7:00am - 8:30. When she woke up at 8:30am she had a nagging left sided chest pain. Patient reports that the pain did not wake her up out of sleep. Patient reports that she has had an MI, and this pain did not feel like that pain. She describes the pain as constant. Nothing made it better or worse. She did not have nitro at home to attempt to relieve the pain. She reports that she did take her baby aspirin this morning with no pain relief. She went to her cardiologist and her BP was noted to be 235/140. They gave her two nitro at the cardiology office. The first one had no effect, and the second improved her pain. Her cardiologist then told her that she needed to come in to the ED. She reports no associated worsening dyspnea (patient has chronic dyspnea 2/2 COPD), no nausea, no diaphoresis. She does admit to an associated throbbing headache on the left side that was markedly improved with the nitro.   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 On carvedilol, asa, statin, and ARB  Also on clonidine for HTN   Iv cardene drip, Plan is to return to home lives alone has family in the area for support Following for progression of care.  Expected Discharge Plan: Home/Self  Care Barriers to Discharge: Barriers Unresolved (comment) (iv cardene hypertensive crisis)   Patient Goals and CMS Choice Patient states their goals for this hospitalization and ongoing recovery are:: to go home CMS Medicare.gov Compare Post Acute Care list provided to:: Patient    Expected Discharge Plan and Services Expected Discharge Plan: Home/Self Care   Discharge Planning Services: CM Consult   Living arrangements for the past 2 months: Single Family Home                                      Prior Living Arrangements/Services Living arrangements for the past 2 months: Single Family Home Lives with:: Self Patient language and need for interpreter reviewed:: Yes Do you feel safe going back to the place where you live?: Yes      Need for Family Participation in Patient Care: Yes (Comment) Care giver support system in place?: Yes (comment)   Criminal Activity/Legal Involvement Pertinent to Current Situation/Hospitalization: No - Comment as needed  Activities of Daily Living Home Assistive Devices/Equipment: None ADL Screening (condition at time of admission) Patient's cognitive ability adequate to safely complete daily activities?: Yes Is the patient deaf or have difficulty hearing?: No Does the patient have difficulty seeing, even when wearing glasses/contacts?: No Does the patient have difficulty concentrating, remembering,  or making decisions?: No Patient able to express need for assistance with ADLs?: Yes Does the patient have difficulty dressing or bathing?: No Independently performs ADLs?: Yes (appropriate for developmental age) Does the patient have difficulty walking or climbing stairs?: No Weakness of Legs: None Weakness of Arms/Hands: None  Permission Sought/Granted                  Emotional Assessment Appearance:: Appears stated age Attitude/Demeanor/Rapport: Engaged Affect (typically observed): Calm Orientation: : Oriented to Place,  Oriented to Self, Oriented to  Time, Oriented to Situation Alcohol / Substance Use: Not Applicable Psych Involvement: No (comment)  Admission diagnosis:  Hypertensive crisis [I16.9] Chest pain of uncertain etiology [K80.0] Coronary artery disease involving native coronary artery of native heart with angina pectoris ([place]) [I25.119] Patient Active Problem List   Diagnosis Date Noted  . Hypertensive crisis 08/11/2020  . Poor sleep 08/06/2020  . Anxiety 08/06/2020  . Vitamin D deficiency 07/30/2020   PCP:  Ronnald Nian, DO Pharmacy:   [facility], [address] AT [address] [place] [address] Phone: [phone] Fax: [phone]     Social Determinants of Health (SDOH) Interventions    Readmission Risk Interventions No flowsheet data found.

## 2020-08-13 ENCOUNTER — Inpatient Hospital Stay (HOSPITAL_COMMUNITY): Payer: Medicare Other

## 2020-08-13 DIAGNOSIS — R079 Chest pain, unspecified: Secondary | ICD-10-CM | POA: Diagnosis not present

## 2020-08-13 DIAGNOSIS — I160 Hypertensive urgency: Secondary | ICD-10-CM | POA: Diagnosis not present

## 2020-08-13 DIAGNOSIS — E7800 Pure hypercholesterolemia, unspecified: Secondary | ICD-10-CM | POA: Diagnosis not present

## 2020-08-13 DIAGNOSIS — I2510 Atherosclerotic heart disease of native coronary artery without angina pectoris: Secondary | ICD-10-CM | POA: Diagnosis not present

## 2020-08-13 DIAGNOSIS — I169 Hypertensive crisis, unspecified: Secondary | ICD-10-CM | POA: Diagnosis not present

## 2020-08-13 LAB — BASIC METABOLIC PANEL
Anion gap: 12 (ref 5–15)
BUN: 19 mg/dL (ref 8–23)
CO2: 28 mmol/L (ref 22–32)
Calcium: 9.9 mg/dL (ref 8.9–10.3)
Chloride: 102 mmol/L (ref 98–111)
Creatinine, Ser: 1.02 mg/dL — ABNORMAL HIGH (ref 0.44–1.00)
GFR, Estimated: 59 mL/min — ABNORMAL LOW (ref >60)
Glucose, Bld: 103 mg/dL — ABNORMAL HIGH (ref 70–99)
Potassium: 3.7 mmol/L (ref 3.5–5.1)
Sodium: 142 mmol/L (ref 135–145)

## 2020-08-13 LAB — ECHOCARDIOGRAM COMPLETE
Area-P 1/2: 3.6 cm2
Height: 68 in
Weight: 3763.2 oz

## 2020-08-13 MED ORDER — BISACODYL 10 MG RE SUPP: 10.0000 mg | Freq: Every day | RECTAL | Status: DC | PRN

## 2020-08-13 MED ORDER — AMLODIPINE BESYLATE 10 MG PO TABS
10.0000 mg | ORAL_TABLET | Freq: Every day | ORAL | Status: DC
  Administered 2020-08-13 – 2020-08-15 (×3): 10 mg via ORAL
  Filled 2020-08-13 (×3): qty 1

## 2020-08-13 MED ORDER — SENNOSIDES-DOCUSATE SODIUM 8.6-50 MG PO TABS
2.0000 | ORAL_TABLET | Freq: Two times a day (BID) | ORAL | Status: DC
  Administered 2020-08-13 – 2020-08-17 (×8): 2 via ORAL
  Filled 2020-08-13 (×8): qty 2

## 2020-08-13 MED ORDER — PERFLUTREN LIPID MICROSPHERE
1.0000 mL | INTRAVENOUS | Status: AC | PRN
  Administered 2020-08-13: 2 mL via INTRAVENOUS
  Filled 2020-08-13: qty 10

## 2020-08-13 MED ORDER — HYDRALAZINE HCL 20 MG/ML IJ SOLN
10.0000 mg | Freq: Once | INTRAMUSCULAR | Status: AC
  Administered 2020-08-13: 10 mg via INTRAVENOUS
  Filled 2020-08-13: qty 1

## 2020-08-13 NOTE — Progress Notes (Signed)
Chaplain engaged in initial visit with Jessica [name] and her brother-in-law.  Chaplain explained her role and offered support.  Chaplain was also able to learn about Jessica [name].  Jessica [name] moved here from [place] in July because of her husband's passing.  She has a lot of family here in [place].  She stated that she didn't drive which made living in the outskirts of [place] harder and she appreciates being here now.  Jessica [name] also shared that she is in the hospital because of her blood pressure and how medical professionals thought she may have a heart attack.  She stated that it has been a long time in which she has experienced her blood pressure fluctuating.  She is currently in the process of awaiting test results and answers.    Chaplain offered the ministries of presence and listening, and will continue to follow-up.     08/13/20 1300  Clinical Encounter Type  Visited With Patient and family together  Visit Type Initial

## 2020-08-13 NOTE — Progress Notes (Signed)
PROGRESS NOTE  Jessica [name] DEY:[id number] DOB: Oct 21, 1945 DOA: 08/11/2020 PCP: Ronnald Nian, DO  HPI/Recap of past 24 hours: Jessica [name]  is a 74 y.o. female, with history of CVA, hypertension, hyperlipidemia, COPD, CHF, breast cancer, asthma, CAD s/p PCI with stent x 2, who presents to the ED with a chief complaint of chest pain that awakened her on the morning of her presentation.  She went to her cardiologist first and there she was found to be severely hypertensive.  Due to concern for possible ACS she was sent to the ED for further evaluation.  She was started on nicardipine drip in the ED with improvement of her uncontrolled HTN.  Troponin S negative x2.  No evidence of acute ischemia on 12 lead EKG.  Hospital course complicated by severe headache stating it feels like the headache she had prior to her diagnosis of stroke.  CT head non contrast pending.    08/13/20: No acute events overnight.  No new complaints this morning.  Renal artery ultrasound and 2D echo completed, with follow results.   Assessment/Plan: Active Problems:   Hypertensive crisis  Resolved chest pain rule out ACS Initially presented at her cardiologist office with complaints of chest pain, found to be severely hypertensive and was sent to the ED for further evaluation. Troponin S- x2 No evidence of acute ischemia on twelve-lead EKG Started on nicardipine drip for hypertensive crisis, was stopped on 08/12/2020 Cardiology consult Continue telemetry monitoring 2D echo completed, follow results.  Hypertensive urgency Presented with severely elevated BP Received nicardipine drip on admission, this was discontinued on 08/12/2020. Norvasc was added by cardiology, currently on 10 mg daily, spironolactone 50 mg daily Home HCTZ was discontinued. Continue home p.o. antihypertensives: Coreg 25 mg twice daily, clonidine 0.2 mg twice daily, doxazosin 4 mg daily, ibesartan 300 mg daily. Continue to closely  monitor vital signs Renal artery Doppler ultrasound completed, follow results  Coronary artery disease status post PCI with stent x2 History of MI Follows with cardiology outpatient Currently denies chest pain Continue antiplatelet, statin, and beta-blocker  Resolved severe headache with prior history of CVA and ongoing management of hypertensive urgency. BP much improved with above regimen recommended by cardiology CT head negative for any acute intracranial findings.  History of CVA Continue aspirin and statin Mild residual right lower extremity weakness States she ambulates without assistance  Chronic anxiety/depression Mood Continue home Zoloft  History of asthma Stable Continue home bronchodilators  Hyperlipidemia Continue home Lipitor 80 mg daily  CKD 3 8 Baseline creatinine appears to be 1.0 with GFR 57 She is at her baseline Monitor urine output Avoid nephrotoxins  Resolved post repletion: Hypokalemia Serum potassium 3.4>3.7 Repleted Magnesium 1.9.  Ambulatory dysfunction PT OT to assess Fall precautions.    Code Status: Full code  Family Communication: None at bedside  Disposition Plan: Home likely with home health services RN   Consultants:  Cardiology  Procedures:  None  Antimicrobials:  None  DVT prophylaxis: Subcu Lovenox daily  Status is: Inpatient    Dispo:  Patient From: Home  Planned Disposition: Home with Health Care Svc  Expected discharge date: 08/14/20  Medically stable for discharge: No, ongoing management of hypertensive urgency.        Objective: Vitals:   08/13/20 1247 08/13/20 1300 08/13/20 1400 08/13/20 1500  BP:  (!) 142/78 140/62 136/69  Pulse:  78 72 87  Resp:  (!) 21 (!) 22 (!) 22  Temp: 98.1 F (36.7 C)  TempSrc: Oral     SpO2:  100% 100% 99%  Weight:      Height:        Intake/Output Summary (Last 24 hours) at 08/13/2020 1516 Last data filed at 08/13/2020 1250 Gross per 24 hour    Intake 783.76 ml  Output 1400 ml  Net -616.24 ml   Filed Weights   08/11/20 1709  Weight: 106.7 kg    Exam:  . General: 74 y.o. year-old female well-developed well-nourished no acute distress. Alert and oriented x3.  . Cardiovascular: Regular rate and rhythm no rubs or gallops.  Marland Kitchen Respiratory: Clear to auscultation no wheezes or rales.  . Abdomen: Soft nontender normal bowel sounds present.  . Musculoskeletal: No lower extremity edema bilaterally.  Marland Kitchen Psychiatry: Mood is appropriate for condition and setting.  Data Reviewed: CBC: Recent Labs  Lab 08/11/20 1648  WBC 9.7  HGB 15.5*  HCT 47.8*  MCV 88.8  PLT 696   Basic Metabolic Panel: Recent Labs  Lab 08/11/20 1648 08/12/20 0505 08/13/20 1023  NA 141 143 142  K 3.3* 3.4* 3.7  CL 104 107 102  CO2 25 23 28   GLUCOSE 99 134* 103*  BUN 21 19 19   CREATININE 1.14* 1.04* 1.02*  CALCIUM 10.2 9.8 9.9  MG  --  1.9  --    GFR: Estimated Creatinine Clearance: 64.7 mL/min (A) (by C-G formula based on SCr of 1.02 mg/dL (H)). Liver Function Tests: No results for input(s): AST, ALT, ALKPHOS, BILITOT, PROT, ALBUMIN in the last 168 hours. No results for input(s): LIPASE, AMYLASE in the last 168 hours. No results for input(s): AMMONIA in the last 168 hours. Coagulation Profile: No results for input(s): INR, PROTIME in the last 168 hours. Cardiac Enzymes: No results for input(s): CKTOTAL, CKMB, CKMBINDEX, TROPONINI in the last 168 hours. BNP (last 3 results) No results for input(s): PROBNP in the last 8760 hours. HbA1C: No results for input(s): HGBA1C in the last 72 hours. CBG: No results for input(s): GLUCAP in the last 168 hours. Lipid Profile: Recent Labs    08/12/20 0505  CHOL 128  HDL 45  LDLCALC 64  TRIG 95  CHOLHDL 2.8   Thyroid Function Tests: Recent Labs    08/12/20 1326  TSH 1.693   Anemia Panel: No results for input(s): VITAMINB12, FOLATE, FERRITIN, TIBC, IRON, RETICCTPCT in the last 72  hours. Urine analysis: No results found for: COLORURINE, APPEARANCEUR, LABSPEC, PHURINE, GLUCOSEU, HGBUR, BILIRUBINUR, KETONESUR, PROTEINUR, UROBILINOGEN, NITRITE, LEUKOCYTESUR Sepsis Labs: @LABRCNTIP (procalcitonin:4,lacticidven:4)  ) Recent Results (from the past 240 hour(s))  Respiratory Panel by RT PCR (Flu A&B, Covid) - Nasopharyngeal Swab     Status: None   Collection Time: 08/11/20  5:22 PM   Specimen: Nasopharyngeal Swab  Result Value Ref Range Status   SARS Coronavirus 2 by RT PCR NEGATIVE NEGATIVE Final    Comment: (NOTE) SARS-CoV-2 target nucleic acids are NOT DETECTED.  The SARS-CoV-2 RNA is generally detectable in upper respiratoy specimens during the acute phase of infection. The lowest concentration of SARS-CoV-2 viral copies this assay can detect is 131 copies/mL. A negative result does not preclude SARS-Cov-2 infection and should not be used as the sole basis for treatment or other patient management decisions. A negative result may occur with  improper specimen collection/handling, submission of specimen other than nasopharyngeal swab, presence of viral mutation(s) within the areas targeted by this assay, and inadequate number of viral copies (<131 copies/mL). A negative result must be combined with clinical observations, patient history,  and epidemiological information. The expected result is Negative.  Fact Sheet for Patients:  PinkCheek.be  Fact Sheet for Healthcare Providers:  GravelBags.it  This test is no t yet approved or cleared by the [place] FDA and  has been authorized for detection and/or diagnosis of SARS-CoV-2 by FDA under an Emergency Use Authorization (EUA). This EUA will remain  in effect (meaning this test can be used) for the duration of the COVID-19 declaration under Section 564(b)(1) of the Act, 21 U.S.C. section 360bbb-3(b)(1), unless the authorization is terminated  or revoked sooner.     Influenza A by PCR NEGATIVE NEGATIVE Final   Influenza B by PCR NEGATIVE NEGATIVE Final    Comment: (NOTE) The Xpert Xpress SARS-CoV-2/FLU/RSV assay is intended as an aid in  the diagnosis of influenza from Nasopharyngeal swab specimens and  should not be used as a sole basis for treatment. Nasal washings and  aspirates are unacceptable for Xpert Xpress SARS-CoV-2/FLU/RSV  testing.  Fact Sheet for Patients: PinkCheek.be  Fact Sheet for Healthcare Providers: GravelBags.it  This test is not yet approved or cleared by the [place] FDA and  has been authorized for detection and/or diagnosis of SARS-CoV-2 by  FDA under an Emergency Use Authorization (EUA). This EUA will remain  in effect (meaning this test can be used) for the duration of the  Covid-19 declaration under Section 564(b)(1) of the Act, 21  U.S.C. section 360bbb-3(b)(1), unless the authorization is  terminated or revoked. Performed at [facility], [place] [address], [place], [place] [address]   MRSA PCR Screening     Status: None   Collection Time: 08/12/20 12:54 PM   Specimen: Nasopharyngeal  Result Value Ref Range Status   MRSA by PCR NEGATIVE NEGATIVE Final    Comment:        The GeneXpert MRSA Assay (FDA approved for NASAL specimens only), is one component of a comprehensive MRSA colonization surveillance program. It is not intended to diagnose MRSA infection nor to guide or monitor treatment for MRSA infections. Performed at [facility], [place] [address] [place], [place] [address]       Studies: ECHOCARDIOGRAM COMPLETE  Result Date: 08/13/2020    ECHOCARDIOGRAM REPORT   Patient Name:   Jessica [name] Date of Exam: 08/13/2020 Medical Rec #:  [id number]       Height:       68.0 in Accession #:    [id number]      Weight:       235.2 lb Date of Birth:  [birth year]      BSA:           2.190 m Patient Age:    30 years        BP:           157/83 mmHg Patient Gender: F               HR:           62 bpm. Exam Location:  Inpatient Procedure: 2D Echo, Cardiac Doppler, Color Doppler and Intracardiac            Opacification Agent Indications:    Chest pain  History:        Patient has no prior history of Echocardiogram examinations. CAD                 and Previous Myocardial Infarction, COPD and Stroke,  Signs/Symptoms:Chest Pain; Risk Factors:Hypertension,                 Dyslipidemia and Obesity.  Sonographer:    Dustin Flock Referring Phys: [id number] [place]  Sonographer Comments: Suboptimal parasternal window and patient is morbidly obese. Image acquisition challenging due to patient body habitus and Image acquisition challenging due to COPD. IMPRESSIONS  1. Left ventricular ejection fraction, by estimation, is 60 to 65%. The left ventricle has normal function. The left ventricle has no regional wall motion abnormalities. Left ventricular diastolic function could not be evaluated.  2. Right ventricular systolic function is normal. The right ventricular size is normal. There is normal pulmonary artery systolic pressure. The estimated right ventricular systolic pressure is 57.3 mmHg.  3. The mitral valve is grossly normal. No evidence of mitral valve regurgitation. No evidence of mitral stenosis.  4. The aortic valve is grossly normal. Aortic valve regurgitation is not visualized. No aortic stenosis is present.  5. The inferior vena cava is normal in size with greater than 50% respiratory variability, suggesting right atrial pressure of 3 mmHg. FINDINGS  Left Ventricle: Left ventricular ejection fraction, by estimation, is 60 to 65%. The left ventricle has normal function. The left ventricle has no regional wall motion abnormalities. Definity contrast agent was given IV to delineate the left ventricular  endocardial borders. The left ventricular internal cavity size was  normal in size. There is no left ventricular hypertrophy. Left ventricular diastolic function could not be evaluated due to indeterminate diastolic function. Left ventricular diastolic function could not be evaluated. Right Ventricle: The right ventricular size is normal. No increase in right ventricular wall thickness. Right ventricular systolic function is normal. There is normal pulmonary artery systolic pressure. The tricuspid regurgitant velocity is 2.04 m/s, and  with an assumed right atrial pressure of 3 mmHg, the estimated right ventricular systolic pressure is 22.0 mmHg. Left Atrium: Left atrial size was normal in size. Right Atrium: Right atrial size was normal in size. Pericardium: Trivial pericardial effusion is present. Presence of pericardial fat pad. Mitral Valve: The mitral valve is grossly normal. No evidence of mitral valve regurgitation. No evidence of mitral valve stenosis. Tricuspid Valve: The tricuspid valve is grossly normal. Tricuspid valve regurgitation is trivial. No evidence of tricuspid stenosis. Aortic Valve: The aortic valve is grossly normal. Aortic valve regurgitation is not visualized. No aortic stenosis is present. Pulmonic Valve: The pulmonic valve was not well visualized. Aorta: The aortic root was not well visualized. Venous: The inferior vena cava is normal in size with greater than 50% respiratory variability, suggesting right atrial pressure of 3 mmHg. IAS/Shunts: The atrial septum is grossly normal.   Diastology LV e' medial:    5.33 cm/s LV E/e' medial:  15.7 LV e' lateral:   9.90 cm/s LV E/e' lateral: 8.4  RIGHT VENTRICLE RV Basal diam:  3.60 cm RV S prime:     7.51 cm/s TAPSE (M-mode): 2.8 cm LEFT ATRIUM             Index       RIGHT ATRIUM           Index LA Vol (A2C):   55.6 ml 25.39 ml/m RA Area:     19.50 cm LA Vol (A4C):   52.8 ml 24.11 ml/m RA Volume:   57.00 ml  26.03 ml/m LA Biplane Vol: 54.8 ml 25.03 ml/m  AORTIC VALVE LVOT Vmax:   94.00 cm/s LVOT Vmean:   69.100 cm/s LVOT VTI:  0.204 m MITRAL VALVE               TRICUSPID VALVE MV Area (PHT): 3.60 cm    TR Peak grad:   16.6 mmHg MV Decel Time: 211 msec    TR Vmax:        204.00 cm/s MV E velocity: 83.60 cm/s MV A velocity: 72.80 cm/s  SHUNTS MV E/A ratio:  1.15        Systemic VTI: 0.20 m Eleonore Chiquito MD Electronically signed by Eleonore Chiquito MD Signature Date/Time: 08/13/2020/2:59:31 PM    Final    VAS US RENAL ARTERY DUPLEX  Result Date: 08/13/2020 ABDOMINAL VISCERAL Indications: Hypertensive urgency High Risk Factors: Hypertension. Limitations: Air/bowel gas, obesity and patient movement, respiratory disturbance. Comparison Study: No prior studies. Performing Technologist: Oliver Hum RVT  Examination Guidelines: A complete evaluation includes B-mode imaging, spectral Doppler, color Doppler, and power Doppler as needed of all accessible portions of each vessel. Bilateral testing is considered an integral part of a complete examination. Limited examinations for reoccurring indications may be performed as noted.  Duplex Findings: +--------------------+--------+--------+------+------------------+ Mesenteric          PSV cm/sEDV cm/sPlaque     Comments      +--------------------+--------+--------+------+------------------+ Aorta Mid              86      15                            +--------------------+--------+--------+------+------------------+ Celiac Artery Origin                      Unable to insonate +--------------------+--------+--------+------+------------------+ SMA Origin            272      28                            +--------------------+--------+--------+------+------------------+    +------------------+--------+--------+-------+ Right Renal ArteryPSV cm/sEDV cm/sComment +------------------+--------+--------+-------+ Origin              168      39           +------------------+--------+--------+-------+ Proximal            144      34            +------------------+--------+--------+-------+ Mid                  64      17           +------------------+--------+--------+-------+ Distal               57      14           +------------------+--------+--------+-------+ +-----------------+--------+--------+-------+ Left Renal ArteryPSV cm/sEDV cm/sComment +-----------------+--------+--------+-------+ Origin             132      28           +-----------------+--------+--------+-------+ Proximal            34      5            +-----------------+--------+--------+-------+ Mid                 28      7            +-----------------+--------+--------+-------+ Distal  89      13           +-----------------+--------+--------+-------+  Technologist observations: There are cystic appearing structures noted within the right kidney. The largest structure measures 4.1 cm high by 5.3 cm wide by 5.1 cm long. +------------+--------+--------+----+-----------+--------+--------+----+ Right KidneyPSV cm/sEDV cm/sRI  Left KidneyPSV cm/sEDV cm/sRI   +------------+--------+--------+----+-----------+--------+--------+----+ Upper Pole  32      8       0.74Upper Pole 45      13      0.72 +------------+--------+--------+----+-----------+--------+--------+----+ Mid         43      12      0.73Mid        39      10      0.74 +------------+--------+--------+----+-----------+--------+--------+----+ Lower Pole  31      8       0.73Lower Pole 34      12      0.67 +------------+--------+--------+----+-----------+--------+--------+----+ Hilar       89      18      0.80Hilar      55      11      0.80 +------------+--------+--------+----+-----------+--------+--------+----+ +------------------+-----+------------------+-----+ Right Kidney           Left Kidney             +------------------+-----+------------------+-----+ RAR                    RAR                      +------------------+-----+------------------+-----+ RAR (manual)      1.95 RAR (manual)      1.53  +------------------+-----+------------------+-----+ Cortex                 Cortex                  +------------------+-----+------------------+-----+ Cortex thickness       Corex thickness         +------------------+-----+------------------+-----+ Kidney length (cm)12.00Kidney length (cm)11.00 +------------------+-----+------------------+-----+  Summary: Renal:  Right: Normal size right kidney. Abnormal right Resistive Index. RRV        flow present. 1-59% stenosis of the right renal artery. There        are cystic appearing structures noted within the right        kidney. The lagest structure measures 4.1 cm high by 5.3 cm        wide by 5.1 cm long. Left:  Normal size of left kidney. Abnormal left Resistive Index.        LRV flow present. 1-59% stenosis of the left renal artery. Mesenteric: Areas of limited visceral study include mesenteric arteries.  *See table(s) above for measurements and observations.  Diagnosing physician: Deitra Mayo MD  Electronically signed by Deitra Mayo MD on 08/13/2020 at 1:04:24 PM.    Final     Scheduled Meds: . amLODipine  10 mg Oral Daily  . aspirin EC  81 mg Oral Daily  . atorvastatin  80 mg Oral Daily  . carvedilol  25 mg Oral BID  . Chlorhexidine Gluconate Cloth  6 each Topical Daily  . cloNIDine  0.2 mg Oral BID  . doxazosin  4 mg Oral Daily  . heparin  5,000 Units Subcutaneous Q8H  . irbesartan  300 mg Oral Daily  . mometasone-formoterol  2 puff Inhalation BID   And  . umeclidinium bromide  1 puff Inhalation Daily  .  mupirocin ointment  1 application Nasal BID  . pneumococcal 23 valent vaccine  0.5 mL Intramuscular Tomorrow-1000  . sertraline  25 mg Oral Daily  . spironolactone  50 mg Oral Daily  . [START ON 08/17/2020] Vitamin D (Ergocalciferol)  50,000 Units Oral Q7 days    Continuous Infusions:    LOS: 1 day      Kayleen Memos, MD [facility] Pager [phone]  If 7PM-7AM, please contact night-coverage [email or web address] Password [email or web address] 08/13/2020, 3:16 PM

## 2020-08-13 NOTE — Progress Notes (Signed)
Renal artery duplex has been completed. Preliminary results can be found in CV Proc through chart review.   08/13/20 10:14 AM Jessica [name] RVT

## 2020-08-13 NOTE — Progress Notes (Signed)
Progress Note  Patient Name: Jessica [name] Date of Encounter: 08/13/2020  [facility] Cardiologist: Donato Heinz, MD   Subjective   Denies any further chest pain or SOB, continue to have a headache  Inpatient Medications    Scheduled Meds:  amLODipine  10 mg Oral Daily   aspirin EC  81 mg Oral Daily   atorvastatin  80 mg Oral Daily   carvedilol  25 mg Oral BID   Chlorhexidine Gluconate Cloth  6 each Topical Q0600   Chlorhexidine Gluconate Cloth  6 each Topical Daily   cloNIDine  0.2 mg Oral BID   doxazosin  4 mg Oral Daily   heparin  5,000 Units Subcutaneous Q8H   irbesartan  300 mg Oral Daily   mometasone-formoterol  2 puff Inhalation BID   And   umeclidinium bromide  1 puff Inhalation Daily   mupirocin ointment  1 application Nasal BID   pneumococcal 23 valent vaccine  0.5 mL Intramuscular Tomorrow-1000   sertraline  25 mg Oral Daily   spironolactone  50 mg Oral Daily   [START ON 08/17/2020] Vitamin D (Ergocalciferol)  50,000 Units Oral Q7 days   Continuous Infusions:  PRN Meds: acetaminophen, albuterol, nitroGLYCERIN, ondansetron (ZOFRAN) IV, traZODone   Vital Signs    Vitals:   08/13/20 0518 08/13/20 0600 08/13/20 0700 08/13/20 0744  BP: (!) 188/68 (!) 192/85 (!) 197/92 (!) 173/110  Pulse:  66 71   Resp:  15 20   Temp:      TempSrc:      SpO2:  100% 97%   Weight:      Height:        Intake/Output Summary (Last 24 hours) at 08/13/2020 0805 Last data filed at 08/13/2020 0750 Gross per 24 hour  Intake 783.76 ml  Output 700 ml  Net 83.76 ml   Last 3 Weights 08/11/2020 08/11/2020 08/06/2020  Weight (lbs) 235 lb 3.2 oz 235 lb 3.2 oz 236 lb  Weight (kg) 106.686 kg 106.686 kg 107.049 kg      Telemetry    NSR without significant ventricular ectopy - Personally Reviewed  ECG    NSR with poor R wave progression in the anterior leads - Personally Reviewed  Physical Exam   GEN: No acute distress.   Neck: No  JVD Cardiac: RRR, no murmurs, rubs, or gallops.  Respiratory: Clear to auscultation bilaterally. GI: Soft, nontender, non-distended  MS: No edema; No deformity. Neuro:  Nonfocal  Psych: Normal affect   Labs    High Sensitivity Troponin:   Recent Labs  Lab 08/11/20 1648 08/11/20 1833  TROPONINIHS 6 6      Chemistry Recent Labs  Lab 08/11/20 1648 08/12/20 0505  NA 141 143  K 3.3* 3.4*  CL 104 107  CO2 25 23  GLUCOSE 99 134*  BUN 21 19  CREATININE 1.14* 1.04*  CALCIUM 10.2 9.8  GFRNONAA 51* 57*  ANIONGAP 12 13     Hematology Recent Labs  Lab 08/11/20 1648  WBC 9.7  RBC 5.38*  HGB 15.5*  HCT 47.8*  MCV 88.8  MCH 28.8  MCHC 32.4  RDW 14.6  PLT 201    BNP Recent Labs  Lab 08/11/20 1725  BNP 65.3     DDimer No results for input(s): DDIMER in the last 168 hours.   Radiology    DG Chest 2 View  Result Date: 08/11/2020 CLINICAL DATA:  Chest pain EXAM: CHEST - 2 VIEW COMPARISON:  06/28/2020, CT 07/14/2020 FINDINGS: Mild  blunting of left CP angle, probable pleural scarring. Cardiomegaly. No focal consolidation. No pneumothorax IMPRESSION: No active cardiopulmonary disease. Cardiomegaly. Probable left CP angle scarring. Electronically Signed   By: Donavan Foil M.D.   On: 08/11/2020 17:11   CT HEAD WO CONTRAST  Result Date: 08/12/2020 CLINICAL DATA:  Headache EXAM: CT HEAD WITHOUT CONTRAST TECHNIQUE: Contiguous axial images were obtained from the base of the skull through the vertex without intravenous contrast. COMPARISON:  None. FINDINGS: Brain: There is no acute intracranial hemorrhage, mass effect, or edema. Gray-white differentiation is preserved. There is no extra-axial fluid collection. Prominence of the ventricles and sulci reflects minor generalized parenchymal volume loss. Confluent areas of hypoattenuation in the supratentorial white matter are nonspecific may reflect advanced chronic microvascular ischemic changes. Small chronic infarcts or  prominent perivascular spaces at the level of the inferior basal ganglia bilaterally. Vascular: There is atherosclerotic calcification at the skull base. Skull: Calvarium is unremarkable. Sinuses/Orbits: No acute finding. Other: None. IMPRESSION: No acute intracranial abnormality. Advanced chronic microvascular ischemic changes. Electronically Signed   By: Macy Mis M.D.   On: 08/12/2020 10:58    Cardiac Studies   Pending echo  Patient Profile     74 y.o. female with PMH of CVA, COPD, HTN, HLD, CKD stage III, breast CA, and CAD s/p PCI presented with chest pain in the setting of hypertensive urgency  Assessment & Plan    1. Chest pain  - feels different compare to previous angina, likely demand ischemia in the setting of hypertensive urgency  - serial enzyme negative  - no plan for ischemic workup unless chest pain recurs despite good BP control or abnormal echo. Complete echo pending  2. Hypertensive urgency  - pending workup for secondary hypertension with 24 hour urine with metanephrine, aldosterone +renin w/ ratio and urine cortisol, renal artery doppler. TSH normal.   - Home meds include 25mg  BID coreg, HCTZ 25mg  daily, clonidine 0.2 mg BID, cardura 2mg  BID, valsartan 160mg  BID  - hospital meds include 25mg  BID coreg, clonidine 0.2mg  BID, cardura 4mg  daily, irbesartan 300mg  daily, +amlodipine 10mg  daily, +spironolactone 50mg  daily (home HCTZ was swtiched to spironolactone to help with hypokalemia)  - amlodipine was increased to 10mg  daily this morning. SBP remain in the 160-170s.   - If BP remain uncontrolled, may need to add HCTZ 25mg  daily back (alternative would be hydralazine, however worried about compliance).  3. Headache: likely related to hypertensive urgency. CT of head negative for acute etiology  4. CAD: aspirin and statin  5. HLD: on lipitor       For questions or updates, please contact [place] Please consult [email or web address] for contact info under         Signed, Almyra Deforest, [place]  08/13/2020, 8:05 AM

## 2020-08-14 ENCOUNTER — Other Ambulatory Visit: Payer: Self-pay

## 2020-08-14 DIAGNOSIS — E7800 Pure hypercholesterolemia, unspecified: Secondary | ICD-10-CM

## 2020-08-14 DIAGNOSIS — I2510 Atherosclerotic heart disease of native coronary artery without angina pectoris: Secondary | ICD-10-CM | POA: Diagnosis not present

## 2020-08-14 DIAGNOSIS — R079 Chest pain, unspecified: Secondary | ICD-10-CM | POA: Diagnosis not present

## 2020-08-14 DIAGNOSIS — I169 Hypertensive crisis, unspecified: Secondary | ICD-10-CM | POA: Diagnosis not present

## 2020-08-14 LAB — MAGNESIUM: Magnesium: 1.9 mg/dL (ref 1.7–2.4)

## 2020-08-14 MED ORDER — CHLORTHALIDONE 25 MG PO TABS
25.0000 mg | ORAL_TABLET | Freq: Every day | ORAL | Status: DC
  Filled 2020-08-14: qty 1

## 2020-08-14 MED ORDER — CLONIDINE HCL 0.2 MG PO TABS
0.2000 mg | ORAL_TABLET | Freq: Two times a day (BID) | ORAL | Status: DC
  Administered 2020-08-14 – 2020-08-17 (×6): 0.2 mg via ORAL
  Filled 2020-08-14: qty 1
  Filled 2020-08-14: qty 2
  Filled 2020-08-14: qty 1
  Filled 2020-08-14 (×3): qty 2

## 2020-08-14 MED ORDER — CLONIDINE HCL 0.3 MG PO TABS: 0.3000 mg | ORAL_TABLET | Freq: Two times a day (BID) | ORAL | Status: DC

## 2020-08-14 NOTE — Progress Notes (Addendum)
Progress Note  Patient Name: Jessica [name] Date of Encounter: 08/14/2020  [facility] Cardiologist: Donato Heinz, MD   Subjective   Denies any chest pain or SOB.  Bp improving but remained elevated yesterday.  This am 173/14mmHg but after several of her meds dropped into the low 100's.   Inpatient Medications    Scheduled Meds: . amLODipine  10 mg Oral Daily  . aspirin EC  81 mg Oral Daily  . atorvastatin  80 mg Oral Daily  . carvedilol  25 mg Oral BID  . Chlorhexidine Gluconate Cloth  6 each Topical Daily  . cloNIDine  0.2 mg Oral BID  . doxazosin  4 mg Oral Daily  . heparin  5,000 Units Subcutaneous Q8H  . irbesartan  300 mg Oral Daily  . mometasone-formoterol  2 puff Inhalation BID   And  . umeclidinium bromide  1 puff Inhalation Daily  . mupirocin ointment  1 application Nasal BID  . pneumococcal 23 valent vaccine  0.5 mL Intramuscular Tomorrow-1000  . senna-docusate  2 tablet Oral BID  . sertraline  25 mg Oral Daily  . spironolactone  50 mg Oral Daily  . [START ON 08/17/2020] Vitamin D (Ergocalciferol)  50,000 Units Oral Q7 days   Continuous Infusions:  PRN Meds: acetaminophen, albuterol, bisacodyl, nitroGLYCERIN, ondansetron (ZOFRAN) IV, traZODone   Vital Signs    Vitals:   08/14/20 0729 08/14/20 0734 08/14/20 0800 08/14/20 0832  BP: (!) 173/72   (!) 119/54  Pulse:  76  66  Resp:  19  (!) 29  Temp:   98.5 F (36.9 C)   TempSrc:   Oral   SpO2:  100%  99%  Weight:      Height:        Intake/Output Summary (Last 24 hours) at 08/14/2020 7106 Last data filed at 08/14/2020 0500 Gross per 24 hour  Intake --  Output 1400 ml  Net -1400 ml   Last 3 Weights 08/11/2020 08/11/2020 08/06/2020  Weight (lbs) 235 lb 3.2 oz 235 lb 3.2 oz 236 lb  Weight (kg) 106.686 kg 106.686 kg 107.049 kg      Telemetry    NSR- Personally Reviewed  ECG    No new EKG to review - Personally Reviewed  Physical Exam   GEN: Well nourished, well developed in  no acute distress HEENT: Normal NECK: No JVD; No carotid bruits LYMPHATICS: No lymphadenopathy CARDIAC:RRR, no murmurs, rubs, gallops RESPIRATORY:  Clear to auscultation without rales, wheezing or rhonchi  ABDOMEN: Soft, non-tender, non-distended MUSCULOSKELETAL:  No edema; No deformity  SKIN: Warm and dry NEUROLOGIC:  Alert and oriented x 3 PSYCHIATRIC:  Normal affect    Labs    High Sensitivity Troponin:   Recent Labs  Lab 08/11/20 1648 08/11/20 1833  TROPONINIHS 6 6      Chemistry Recent Labs  Lab 08/11/20 1648 08/12/20 0505 08/13/20 1023  NA 141 143 142  K 3.3* 3.4* 3.7  CL 104 107 102  CO2 25 23 28   GLUCOSE 99 134* 103*  BUN 21 19 19   CREATININE 1.14* 1.04* 1.02*  CALCIUM 10.2 9.8 9.9  GFRNONAA 51* 57* 59*  ANIONGAP 12 13 12      Hematology Recent Labs  Lab 08/11/20 1648  WBC 9.7  RBC 5.38*  HGB 15.5*  HCT 47.8*  MCV 88.8  MCH 28.8  MCHC 32.4  RDW 14.6  PLT 201    BNP Recent Labs  Lab 08/11/20 1725  BNP 65.3  DDimer No results for input(s): DDIMER in the last 168 hours.   Radiology    CT HEAD WO CONTRAST  Result Date: 08/12/2020 CLINICAL DATA:  Headache EXAM: CT HEAD WITHOUT CONTRAST TECHNIQUE: Contiguous axial images were obtained from the base of the skull through the vertex without intravenous contrast. COMPARISON:  None. FINDINGS: Brain: There is no acute intracranial hemorrhage, mass effect, or edema. Gray-white differentiation is preserved. There is no extra-axial fluid collection. Prominence of the ventricles and sulci reflects minor generalized parenchymal volume loss. Confluent areas of hypoattenuation in the supratentorial white matter are nonspecific may reflect advanced chronic microvascular ischemic changes. Small chronic infarcts or prominent perivascular spaces at the level of the inferior basal ganglia bilaterally. Vascular: There is atherosclerotic calcification at the skull base. Skull: Calvarium is unremarkable.  Sinuses/Orbits: No acute finding. Other: None. IMPRESSION: No acute intracranial abnormality. Advanced chronic microvascular ischemic changes. Electronically Signed   By: Macy Mis M.D.   On: 08/12/2020 10:58   ECHOCARDIOGRAM COMPLETE  Result Date: 08/13/2020    ECHOCARDIOGRAM REPORT   Patient Name:   Jessica [name] Date of Exam: 08/13/2020 Medical Rec #:  [id number]       Height:       68.0 in Accession #:    [id number]      Weight:       235.2 lb Date of Birth:  10/06/1946      BSA:          2.190 m Patient Age:    74 years        BP:           157/83 mmHg Patient Gender: F               HR:           62 bpm. Exam Location:  Inpatient Procedure: 2D Echo, Cardiac Doppler, Color Doppler and Intracardiac            Opacification Agent Indications:    Chest pain  History:        Patient has no prior history of Echocardiogram examinations. CAD                 and Previous Myocardial Infarction, COPD and Stroke,                 Signs/Symptoms:Chest Pain; Risk Factors:Hypertension,                 Dyslipidemia and Obesity.  Sonographer:    Dustin Flock Referring Phys: [id number] [place]  Sonographer Comments: Suboptimal parasternal window and patient is morbidly obese. Image acquisition challenging due to patient body habitus and Image acquisition challenging due to COPD. IMPRESSIONS  1. Left ventricular ejection fraction, by estimation, is 60 to 65%. The left ventricle has normal function. The left ventricle has no regional wall motion abnormalities. Left ventricular diastolic function could not be evaluated.  2. Right ventricular systolic function is normal. The right ventricular size is normal. There is normal pulmonary artery systolic pressure. The estimated right ventricular systolic pressure is 33.8 mmHg.  3. The mitral valve is grossly normal. No evidence of mitral valve regurgitation. No evidence of mitral stenosis.  4. The aortic valve is grossly normal. Aortic valve regurgitation is not  visualized. No aortic stenosis is present.  5. The inferior vena cava is normal in size with greater than 50% respiratory variability, suggesting right atrial pressure of 3 mmHg. FINDINGS  Left Ventricle: Left ventricular ejection fraction, by estimation,  is 60 to 65%. The left ventricle has normal function. The left ventricle has no regional wall motion abnormalities. Definity contrast agent was given IV to delineate the left ventricular  endocardial borders. The left ventricular internal cavity size was normal in size. There is no left ventricular hypertrophy. Left ventricular diastolic function could not be evaluated due to indeterminate diastolic function. Left ventricular diastolic function could not be evaluated. Right Ventricle: The right ventricular size is normal. No increase in right ventricular wall thickness. Right ventricular systolic function is normal. There is normal pulmonary artery systolic pressure. The tricuspid regurgitant velocity is 2.04 m/s, and  with an assumed right atrial pressure of 3 mmHg, the estimated right ventricular systolic pressure is 63.7 mmHg. Left Atrium: Left atrial size was normal in size. Right Atrium: Right atrial size was normal in size. Pericardium: Trivial pericardial effusion is present. Presence of pericardial fat pad. Mitral Valve: The mitral valve is grossly normal. No evidence of mitral valve regurgitation. No evidence of mitral valve stenosis. Tricuspid Valve: The tricuspid valve is grossly normal. Tricuspid valve regurgitation is trivial. No evidence of tricuspid stenosis. Aortic Valve: The aortic valve is grossly normal. Aortic valve regurgitation is not visualized. No aortic stenosis is present. Pulmonic Valve: The pulmonic valve was not well visualized. Aorta: The aortic root was not well visualized. Venous: The inferior vena cava is normal in size with greater than 50% respiratory variability, suggesting right atrial pressure of 3 mmHg. IAS/Shunts: The atrial  septum is grossly normal.   Diastology LV e' medial:    5.33 cm/s LV E/e' medial:  15.7 LV e' lateral:   9.90 cm/s LV E/e' lateral: 8.4  RIGHT VENTRICLE RV Basal diam:  3.60 cm RV S prime:     7.51 cm/s TAPSE (M-mode): 2.8 cm LEFT ATRIUM             Index       RIGHT ATRIUM           Index LA Vol (A2C):   55.6 ml 25.39 ml/m RA Area:     19.50 cm LA Vol (A4C):   52.8 ml 24.11 ml/m RA Volume:   57.00 ml  26.03 ml/m LA Biplane Vol: 54.8 ml 25.03 ml/m  AORTIC VALVE LVOT Vmax:   94.00 cm/s LVOT Vmean:  69.100 cm/s LVOT VTI:    0.204 m MITRAL VALVE               TRICUSPID VALVE MV Area (PHT): 3.60 cm    TR Peak grad:   16.6 mmHg MV Decel Time: 211 msec    TR Vmax:        204.00 cm/s MV E velocity: 83.60 cm/s MV A velocity: 72.80 cm/s  SHUNTS MV E/A ratio:  1.15        Systemic VTI: 0.20 m Eleonore Chiquito MD Electronically signed by Eleonore Chiquito MD Signature Date/Time: 08/13/2020/2:59:31 PM    Final    VAS US RENAL ARTERY DUPLEX  Result Date: 08/13/2020 ABDOMINAL VISCERAL Indications: Hypertensive urgency High Risk Factors: Hypertension. Limitations: Air/bowel gas, obesity and patient movement, respiratory disturbance. Comparison Study: No prior studies. Performing Technologist: Oliver Hum RVT  Examination Guidelines: A complete evaluation includes B-mode imaging, spectral Doppler, color Doppler, and power Doppler as needed of all accessible portions of each vessel. Bilateral testing is considered an integral part of a complete examination. Limited examinations for reoccurring indications may be performed as noted.  Duplex Findings: +--------------------+--------+--------+------+------------------+ Mesenteric  PSV cm/sEDV cm/sPlaque     Comments      +--------------------+--------+--------+------+------------------+ Aorta Mid              86      15                            +--------------------+--------+--------+------+------------------+ Celiac Artery Origin                       Unable to insonate +--------------------+--------+--------+------+------------------+ SMA Origin            272      28                            +--------------------+--------+--------+------+------------------+    +------------------+--------+--------+-------+ Right Renal ArteryPSV cm/sEDV cm/sComment +------------------+--------+--------+-------+ Origin              168      39           +------------------+--------+--------+-------+ Proximal            144      34           +------------------+--------+--------+-------+ Mid                  64      17           +------------------+--------+--------+-------+ Distal               57      14           +------------------+--------+--------+-------+ +-----------------+--------+--------+-------+ Left Renal ArteryPSV cm/sEDV cm/sComment +-----------------+--------+--------+-------+ Origin             132      28           +-----------------+--------+--------+-------+ Proximal            34      5            +-----------------+--------+--------+-------+ Mid                 28      7            +-----------------+--------+--------+-------+ Distal              39      13           +-----------------+--------+--------+-------+  Technologist observations: There are cystic appearing structures noted within the right kidney. The largest structure measures 4.1 cm high by 5.3 cm wide by 5.1 cm long. +------------+--------+--------+----+-----------+--------+--------+----+ Right KidneyPSV cm/sEDV cm/sRI  Left KidneyPSV cm/sEDV cm/sRI   +------------+--------+--------+----+-----------+--------+--------+----+ Upper Pole  32      8       0.74Upper Pole 45      13      0.72 +------------+--------+--------+----+-----------+--------+--------+----+ Mid         43      12      0.73Mid        39      10      0.74 +------------+--------+--------+----+-----------+--------+--------+----+ Lower Pole  31       8       0.73Lower Pole 34      12      0.67 +------------+--------+--------+----+-----------+--------+--------+----+ Hilar       89      18      0.80Hilar      55      11  0.80 +------------+--------+--------+----+-----------+--------+--------+----+ +------------------+-----+------------------+-----+ Right Kidney           Left Kidney             +------------------+-----+------------------+-----+ RAR                    RAR                     +------------------+-----+------------------+-----+ RAR (manual)      1.95 RAR (manual)      1.53  +------------------+-----+------------------+-----+ Cortex                 Cortex                  +------------------+-----+------------------+-----+ Cortex thickness       Corex thickness         +------------------+-----+------------------+-----+ Kidney length (cm)12.00Kidney length (cm)11.00 +------------------+-----+------------------+-----+  Summary: Renal:  Right: Normal size right kidney. Abnormal right Resistive Index. RRV        flow present. 1-59% stenosis of the right renal artery. There        are cystic appearing structures noted within the right        kidney. The lagest structure measures 4.1 cm high by 5.3 cm        wide by 5.1 cm long. Left:  Normal size of left kidney. Abnormal left Resistive Index.        LRV flow present. 1-59% stenosis of the left renal artery. Mesenteric: Areas of limited visceral study include mesenteric arteries.  *See table(s) above for measurements and observations.  Diagnosing physician: Deitra Mayo MD  Electronically signed by Deitra Mayo MD on 08/13/2020 at 1:04:24 PM.    Final     Cardiac Studies   2D echo 07/2020 IMPRESSIONS    1. Left ventricular ejection fraction, by estimation, is 60 to 65%. The  left ventricle has normal function. The left ventricle has no regional  wall motion abnormalities. Left ventricular diastolic function could not  be  evaluated.  2. Right ventricular systolic function is normal. The right ventricular  size is normal. There is normal pulmonary artery systolic pressure. The  estimated right ventricular systolic pressure is 04.5 mmHg.  3. The mitral valve is grossly normal. No evidence of mitral valve  regurgitation. No evidence of mitral stenosis.  4. The aortic valve is grossly normal. Aortic valve regurgitation is not  visualized. No aortic stenosis is present.  5. The inferior vena cava is normal in size with greater than 50%  respiratory variability, suggesting right atrial pressure of 3 mmHg.   Patient Profile     74 y.o. female with PMH of CVA, COPD, HTN, HLD, CKD stage III, breast CA, and CAD s/p PCI presented with chest pain in the setting of hypertensive urgency  Assessment & Plan    1. Chest pain  - feels different compare to previous angina, likely demand ischemia in the setting of hypertensive urgency  - serial enzyme negative  - no plan for ischemic workup unless chest pain recurs despite good BP control or abnormal echo.   -2D echo with normal LVF  2. Hypertensive urgency  - pending workup for secondary hypertension with 24 hour urine with metanephrine, aldosterone +renin w/ ratio and urine cortisol  -renal dopplers with 1-59% bilateral RAS  -TSH normal  -renal artery doppler. TSH normal.   - Home meds include 25mg  BID coreg, HCTZ 25mg  daily, clonidine 0.2 mg BID, cardura 2mg  BID, valsartan 160mg  BID  -  hospital meds include 25mg  BID coreg, clonidine 0.2mg  BID, cardura 4mg  daily, irbesartan 300mg  daily, amlodipine 10mg  daily, +spironolactone 50mg  daily (home HCTZ was swtiched to spironolactone to help with hypokalemia)  - BP remain in the 160-170s but this am after several of her meds given early because of elevated BP, her SBp dropped into the low 100's  - SCr is normal at 1.02 ` - will not make any changes to meds today and see what BP trend is throughout the day today  3.  Headache:   -likely related to hypertensive urgency. CT of head negative for acute etiology  4. CAD:   -no anginal sx>>CP this admit not like her prior angina  -hsTrop normal  -aspirin and statin  5. HLD:   -LDL goal < 70  -LDL 64 on lipitor     I have spent a total of 35 minutes with patient reviewing 2D echo, renal dopplers , telemetry, EKGs, labs and examining patient as well as establishing an assessment and plan that was discussed with the patient.  > 50% of time was spent in direct patient care.    For questions or updates, please contact [place] Please consult [email or web address] for contact info under        Signed, Fransico Him, MD  08/14/2020, 9:21 AM

## 2020-08-14 NOTE — Consult Note (Signed)
[facility] Renal Consultation Note  Requesting MD: [place] Crane Indication for Consultation: Malignant hypertension, kidney cysts  HPI:  Jessica [name] is a 74 y.o. 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  She moved here from [place] in July.  She reports that she has had hypertension for decades.  According to the chart, home medications included Coreg 25 mg twice daily, HCTZ 25 mg daily, clonidine 0.2 twice daily, Cardura 2 twice daily and valsartan 160 twice daily.  She says with this she felt that her blood pressure was under reasonable control.  She has a wrist cuff monitor at home but has not had it calibrated at a doctor's office.  She reports at times she has headaches and that is when she knows that her blood pressure is high.  She was noted to have chest pain, went to the cardiologist was found to have very high blood pressure.  Was sent to the hospital to rule out possible ACS.  She required transient management with a Cardene drip but has been off of that for greater than 24 hours.  Her current blood pressure regimen here includes Norvasc 10 mg daily possibly started yesterday, Coreg 25 mg twice daily, clonidine 0.2 mg twice daily, Cardura 4 mg daily, Avapro 300 mg daily, and Aldactone 50 mg given 1st Thursday at 1 PM.  She was given that due to hypokalemia.  With those medication changes, blood pressure was actually reasonable.  Then, this morning at around 8 AM blood pressure did start to drop, got as low as 92/34 at 10 AM but now systolics back up into the one sixties.  This might have been consistent with her most recent dose of Aldactone (was given this morning at 730).  She feels okay at present.  She underwent a renal artery duplex which indicated less than 60% blockage.  Cysts were noted on  the right kidney.  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  Troponins have not been elevated and echo is pretty normal.  GFR is relatively normal  Creatinine, Ser  Date/Time Value Ref Range Status  08/13/2020 10:23 AM 1.02 (H) 0.44 - 1.00 mg/dL Final  08/12/2020 05:05 AM 1.04 (H) 0.44 - 1.00 mg/dL Final  08/11/2020 04:48 PM 1.14 (H) 0.44 - 1.00 mg/dL Final  07/28/2020 03:14 PM 1.28 (H) 0.40 - 1.20 mg/dL Final     PMHx:   Past Medical History:  Diagnosis Date   Asthma    Cancer ([place])    CHF (congestive heart failure) ([place])    Chronic kidney disease    COPD (chronic obstructive pulmonary disease) ([place])    Hyperlipidemia    Hypertension    Stroke [facility])     Past Surgical History:  Procedure Laterality Date   stomach surgery  1990    Family Hx:  Family History  Problem Relation Age of Onset   Stroke Mother    Heart disease Mother    Hyperlipidemia Mother    Heart disease Maternal Aunt    Hyperlipidemia Maternal Aunt    Stroke Maternal Aunt     Social History:  reports that she quit smoking about 21 months ago. Her smoking use included cigarettes. She has never used smokeless tobacco. She reports that she does not drink alcohol and does not use drugs.  Allergies: No Known Allergies  Medications: Prior to Admission medications  Medication Sig Start Date End Date Taking? Authorizing Provider  albuterol (ACCUNEB) 1.25 MG/3ML nebulizer solution Take 3 mLs (1.25 mg total) by nebulization every 6 (six) hours as needed for wheezing. 06/28/20  Yes Mannam, Praveen, MD  albuterol (VENTOLIN HFA) 108 (90 Base) MCG/ACT inhaler Inhale 2 puffs into the lungs every 6 (six) hours as needed. Patient taking differently: Inhale 2 puffs into the lungs every 6 (six) hours as needed for wheezing.  07/21/20  Yes Cirigliano, Mary K, DO  anastrozole (ARIMIDEX) 1 MG tablet Take 1 tablet (1 mg total) by mouth daily. 07/21/20  Yes Cirigliano, Mary K, DO  atorvastatin (LIPITOR) 80 MG  tablet Take 80 mg by mouth daily. 05/12/20  Yes [provider]  carvedilol (COREG) 25 MG tablet Take 25 mg by mouth 2 (two) times daily. 05/18/20  Yes [provider]  cloNIDine (CATAPRES) 0.2 MG tablet Take 0.2 mg by mouth 2 (two) times daily. 05/12/20  Yes [provider]  doxazosin (CARDURA) 2 MG tablet TAKE 1 TABLET(2 MG) BY MOUTH TWICE DAILY Patient taking differently: Take 2 mg by mouth 2 (two) times daily.  08/03/20  Yes Cirigliano, Mary K, DO  Fluticasone-Umeclidin-Vilant (TRELEGY ELLIPTA) 200-62.5-25 MCG/INH AEPB Inhale 1 puff into the lungs daily. 07/21/20  Yes Cirigliano, Mary K, DO  hydrochlorothiazide (HYDRODIURIL) 25 MG tablet Take 1 tablet (25 mg total) by mouth daily. 08/03/20  Yes Cirigliano, Mary K, DO  sertraline (ZOLOFT) 25 MG tablet Take 1 tablet (25 mg total) by mouth daily. 08/06/20  Yes Cirigliano, Mary K, DO  traZODone (DESYREL) 50 MG tablet Take 1 tablet (50 mg total) by mouth at bedtime as needed for sleep. 08/06/20  Yes Cirigliano, Mary K, DO  valsartan (DIOVAN) 160 MG tablet Take 1 tablet (160 mg total) by mouth 2 (two) times daily. 08/03/20  Yes Cirigliano, Mary K, DO  Vitamin D, Ergocalciferol, (DRISDOL) 1.25 MG (50000 UNIT) CAPS capsule Take 1 capsule (50,000 Units total) by mouth every 7 (seven) days. 08/02/20  Yes Cirigliano, Garvin Fila, DO    I have reviewed the patient's current medications.  Labs:  Results for orders placed or performed during the hospital encounter of 08/11/20 (from the past 48 hour(s))  Basic metabolic panel     Status: Abnormal   Collection Time: 08/13/20 10:23 AM  Result Value Ref Range   Sodium 142 135 - 145 mmol/L   Potassium 3.7 3.5 - 5.1 mmol/L   Chloride 102 98 - 111 mmol/L   CO2 28 22 - 32 mmol/L   Glucose, Bld 103 (H) 70 - 99 mg/dL    Comment: Glucose reference range applies only to samples taken after fasting for at least 8 hours.   BUN 19 8 - 23 mg/dL   Creatinine, Ser 1.02 (H) 0.44 - 1.00 mg/dL   Calcium  9.9 8.9 - 10.3 mg/dL   GFR, Estimated 59 (L) >60 mL/min    Comment: (NOTE) Calculated using the CKD-EPI Creatinine Equation (2021)    Anion gap 12 5 - 15    Comment: Performed at [facility], [place] [address], [place], [place] [address]  Magnesium     Status: None   Collection Time: 08/14/20 10:16 AM  Result Value Ref Range   Magnesium 1.9 1.7 - 2.4 mg/dL    Comment: Performed at [facility], [place] [address] [place], [place] [address]     ROS:  A comprehensive review of systems was negative.  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  A hope that with the overcontrolled blood pressure we do not induce ATN-urine output good so far 2.  Renal cysts-not of concern at this time 3. Hypertension/volume  -is very difficult to control blood pressure in an inpatient setting.  We tend to over control as is what might be happening here.  Because she has hypokalemia very suspicious for hyperaldosteronism.  In which case, Aldactone was the perfect choice and it may lead to amazingly quick control of blood pressure.  I have not chosen to make any medication changes right now.  If she continues to have periods of low blood pressure probably the agents that I would wean would be the Cardura and the clonidine and continue the other agents.  Also like to  make sure that medications are distributed throughout the day so not getting 3 and 4 medicines all at once.  Again, this is something that is probably better managed in an outpatient situation 4. Anemia  -not an issue at this time  I have given my recommendations regarding blood pressure agents, however cardiology has a reasonable handle on it at this time.  The cysts and renal function are nothing to be concerned about.  I am happy to make sure that she has an appointment to see somebody in our office to help assist with blood pressure management.  She actually believes that she does have an appointment with somebody next month.  I will not see her tomorrow.  We will revisit if there is any issue with her kidney function or blood pressure control that we can help with on Monday, otherwise will follow as an outpatient   Louis Meckel 08/14/2020, 3:06 PM

## 2020-08-14 NOTE — Progress Notes (Signed)
PROGRESS NOTE  Jessica [name] WPY:[id number] DOB: 1946-03-18 DOA: 08/11/2020 PCP: Ronnald Nian, DO  HPI/Recap of past 24 hours: Jessica [name]  is a 74 y.o. female, with history of CVA, hypertension, hyperlipidemia, COPD, CHF, breast cancer, asthma, CAD s/p PCI with stent x 2, who presents to the ED with a chief complaint of chest pain that awakened her on the morning of her presentation.  She went to her cardiologist first and there she was found to be severely hypertensive.  Due to concern for possible ACS she was sent to the ED for further evaluation.  She was started on nicardipine drip in the ED with improvement of her uncontrolled HTN.  Troponin S negative x2.  No evidence of acute ischemia on 12 lead EKG.  Hospital course complicated by severe headache stating it feels like the headache she had prior to her diagnosis of stroke.  CT head non contrast pending.    Renal artery Doppler ultrasound showing 1 to 59% bilateral renal artery stenosis and 2D echo completed on 08/13/20 showing normal LVEF.  08/14/20: Denies chest pain.  Reports right sided headache, would not elaborate further.  Denies history of migraines.  Labile BP.  Assessment/Plan: Active Problems:   Hypertensive crisis  Resolved chest pain rule out ACS Initially presented to her cardiologist with complaints of chest pain, found to be severely hypertensive and was sent to the ED for further evaluation. Troponin S- x2 No evidence of acute ischemia on twelve-lead EKG Started on nicardipine drip for hypertensive crisis, was stopped on 08/12/2020 2D echo showing normal LVEF. At the time of this visit.  Chest x-ray had resolved. Cardiology following.  Hypertensive urgency, BP is currently labile Presented with severely elevated BP Received nicardipine drip on admission, this was discontinued on 08/12/2020. Norvasc was added by cardiology, currently on 10 mg daily, spironolactone 50 mg daily Home HCTZ was  discontinued. Currently on home p.o. antihypertensives: Coreg 25 mg twice daily, clonidine 0.2 mg twice daily, doxazosin 4 mg daily, ibesartan 300 mg daily. Renal artery Doppler ultrasound completed 1 to 59% renal artery stenosis bilaterally. Nephrology consulted to assist.  Right renal cysts/bilateral renal artery stenosis 1 to 59% Renal artery Doppler ultrasound completed on 08/13/2020 Nephrology consulted  Coronary artery disease status post PCI with stent x2 History of MI Follows with cardiology outpatient Currently denies chest pain Continue antiplatelet, statin, and beta-blocker  Intermittent headache with prior history of CVA and ongoing management of hypertensive urgency. CT head negative for any acute intracranial findings. Continue as needed analgesics  History of CVA Continue aspirin and statin Mild residual right lower extremity weakness States she ambulates without assistance  Chronic anxiety/depression Mood Continue home Zoloft  History of asthma Stable Continue home bronchodilators  Hyperlipidemia Continue home Lipitor 80 mg daily  CKD 3A Baseline creatinine appears to be 1.0 with GFR 57 She is at her baseline Monitor urine output Avoid nephrotoxins  Resolved post repletion: Hypokalemia Serum potassium 3.4>3.7 Repleted Magnesium 1.9.  Ambulatory dysfunction PT OT to assess Fall precautions.  Constipation Resolved Continue bowel regimen  Obesity BMI 35 Recommend weight loss outpatient with regular physical activity and healthy dieting.    Code Status: Full code  Family Communication: None at bedside  Disposition Plan: Home likely with home health services RN   Consultants:  Cardiology  Nephrology  Procedures:  None  Antimicrobials:  None  DVT prophylaxis: Subcu Lovenox daily  Status is: Inpatient    Dispo:  Patient From: Home  Planned Disposition: Home  with Health Care Svc  Expected discharge date:  08/15/20  Medically stable for discharge: No, ongoing management of hypertensive urgency.        Objective: Vitals:   08/14/20 0946 08/14/20 1000 08/14/20 1005 08/14/20 1200  BP: (!) 80/52 (!) 98/23 (!) 92/34   Pulse:  84 (!) 58   Resp:  (!) 24 (!) 22   Temp:    97.9 F (36.6 C)  TempSrc:    Oral  SpO2:  100% 100%   Weight:      Height:        Intake/Output Summary (Last 24 hours) at 08/14/2020 1405 Last data filed at 08/14/2020 1000 Gross per 24 hour  Intake 500 ml  Output 700 ml  Net -200 ml   Filed Weights   08/11/20 1709  Weight: 106.7 kg    Exam:  . General: 74 y.o. year-old female well-developed well-nourished in no acute stress.  Alert and oriented x3. . Cardiovascular: Regular rate and rhythm no rubs or gallops. Marland Kitchen Respiratory: Clear to auscultation no wheezes or rales.  Abdomen: Obese soft nontender normal bowel sounds present . Musculoskeletal: No lower extremity edema bilaterally.  Marland Kitchen Psychiatry: Mood is appropriate for condition and setting.  Data Reviewed: CBC: Recent Labs  Lab 08/11/20 1648  WBC 9.7  HGB 15.5*  HCT 47.8*  MCV 88.8  PLT 086   Basic Metabolic Panel: Recent Labs  Lab 08/11/20 1648 08/12/20 0505 08/13/20 1023 08/14/20 1016  NA 141 143 142  --   K 3.3* 3.4* 3.7  --   CL 104 107 102  --   CO2 25 23 28   --   GLUCOSE 99 134* 103*  --   BUN 21 19 19   --   CREATININE 1.14* 1.04* 1.02*  --   CALCIUM 10.2 9.8 9.9  --   MG  --  1.9  --  1.9   GFR: Estimated Creatinine Clearance: 64.7 mL/min (A) (by C-G formula based on SCr of 1.02 mg/dL (H)). Liver Function Tests: No results for input(s): AST, ALT, ALKPHOS, BILITOT, PROT, ALBUMIN in the last 168 hours. No results for input(s): LIPASE, AMYLASE in the last 168 hours. No results for input(s): AMMONIA in the last 168 hours. Coagulation Profile: No results for input(s): INR, PROTIME in the last 168 hours. Cardiac Enzymes: No results for input(s): CKTOTAL, CKMB, CKMBINDEX,  TROPONINI in the last 168 hours. BNP (last 3 results) No results for input(s): PROBNP in the last 8760 hours. HbA1C: No results for input(s): HGBA1C in the last 72 hours. CBG: No results for input(s): GLUCAP in the last 168 hours. Lipid Profile: Recent Labs    08/12/20 0505  CHOL 128  HDL 45  LDLCALC 64  TRIG 95  CHOLHDL 2.8   Thyroid Function Tests: Recent Labs    08/12/20 1326  TSH 1.693   Anemia Panel: No results for input(s): VITAMINB12, FOLATE, FERRITIN, TIBC, IRON, RETICCTPCT in the last 72 hours. Urine analysis: No results found for: COLORURINE, APPEARANCEUR, LABSPEC, PHURINE, GLUCOSEU, HGBUR, BILIRUBINUR, KETONESUR, PROTEINUR, UROBILINOGEN, NITRITE, LEUKOCYTESUR Sepsis Labs: @LABRCNTIP (procalcitonin:4,lacticidven:4)  ) Recent Results (from the past 240 hour(s))  Respiratory Panel by RT PCR (Flu A&B, Covid) - Nasopharyngeal Swab     Status: None   Collection Time: 08/11/20  5:22 PM   Specimen: Nasopharyngeal Swab  Result Value Ref Range Status   SARS Coronavirus 2 by RT PCR NEGATIVE NEGATIVE Final    Comment: (NOTE) SARS-CoV-2 target nucleic acids are NOT DETECTED.  The SARS-CoV-2 RNA  is generally detectable in upper respiratoy specimens during the acute phase of infection. The lowest concentration of SARS-CoV-2 viral copies this assay can detect is 131 copies/mL. A negative result does not preclude SARS-Cov-2 infection and should not be used as the sole basis for treatment or other patient management decisions. A negative result may occur with  improper specimen collection/handling, submission of specimen other than nasopharyngeal swab, presence of viral mutation(s) within the areas targeted by this assay, and inadequate number of viral copies (<131 copies/mL). A negative result must be combined with clinical observations, patient history, and epidemiological information. The expected result is Negative.  Fact Sheet for Patients:   PinkCheek.be  Fact Sheet for Healthcare Providers:  GravelBags.it  This test is no t yet approved or cleared by the [place] FDA and  has been authorized for detection and/or diagnosis of SARS-CoV-2 by FDA under an Emergency Use Authorization (EUA). This EUA will remain  in effect (meaning this test can be used) for the duration of the COVID-19 declaration under Section 564(b)(1) of the Act, 21 U.S.C. section 360bbb-3(b)(1), unless the authorization is terminated or revoked sooner.     Influenza A by PCR NEGATIVE NEGATIVE Final   Influenza B by PCR NEGATIVE NEGATIVE Final    Comment: (NOTE) The Xpert Xpress SARS-CoV-2/FLU/RSV assay is intended as an aid in  the diagnosis of influenza from Nasopharyngeal swab specimens and  should not be used as a sole basis for treatment. Nasal washings and  aspirates are unacceptable for Xpert Xpress SARS-CoV-2/FLU/RSV  testing.  Fact Sheet for Patients: PinkCheek.be  Fact Sheet for Healthcare Providers: GravelBags.it  This test is not yet approved or cleared by the [place] FDA and  has been authorized for detection and/or diagnosis of SARS-CoV-2 by  FDA under an Emergency Use Authorization (EUA). This EUA will remain  in effect (meaning this test can be used) for the duration of the  Covid-19 declaration under Section 564(b)(1) of the Act, 21  U.S.C. section 360bbb-3(b)(1), unless the authorization is  terminated or revoked. Performed at [facility], [place] [address], [place], [place] [address]   MRSA PCR Screening     Status: None   Collection Time: 08/12/20 12:54 PM   Specimen: Nasopharyngeal  Result Value Ref Range Status   MRSA by PCR NEGATIVE NEGATIVE Final    Comment:        The GeneXpert MRSA Assay (FDA approved for NASAL specimens only), is one component of a comprehensive MRSA  colonization surveillance program. It is not intended to diagnose MRSA infection nor to guide or monitor treatment for MRSA infections. Performed at [facility], [place] [address] [place], [place] [address]       Studies: No results found.  Scheduled Meds: . amLODipine  10 mg Oral Daily  . aspirin EC  81 mg Oral Daily  . atorvastatin  80 mg Oral Daily  . carvedilol  25 mg Oral BID  . Chlorhexidine Gluconate Cloth  6 each Topical Daily  . cloNIDine  0.2 mg Oral BID  . doxazosin  4 mg Oral Daily  . heparin  5,000 Units Subcutaneous Q8H  . irbesartan  300 mg Oral Daily  . mometasone-formoterol  2 puff Inhalation BID   And  . umeclidinium bromide  1 puff Inhalation Daily  . mupirocin ointment  1 application Nasal BID  . pneumococcal 23 valent vaccine  0.5 mL Intramuscular Tomorrow-1000  . senna-docusate  2 tablet Oral BID  . sertraline  25 mg Oral Daily  . spironolactone  50 mg Oral Daily  . [START ON 08/17/2020] Vitamin D (Ergocalciferol)  50,000 Units Oral Q7 days    Continuous Infusions:    LOS: 2 days     Kayleen Memos, MD [facility] Pager [phone]  If 7PM-7AM, please contact night-coverage [email or web address] Password [email or web address] 08/14/2020, 2:05 PM

## 2020-08-15 ENCOUNTER — Encounter (HOSPITAL_COMMUNITY): Payer: Self-pay | Admitting: Internal Medicine

## 2020-08-15 DIAGNOSIS — I2510 Atherosclerotic heart disease of native coronary artery without angina pectoris: Secondary | ICD-10-CM | POA: Diagnosis not present

## 2020-08-15 DIAGNOSIS — E7800 Pure hypercholesterolemia, unspecified: Secondary | ICD-10-CM | POA: Diagnosis not present

## 2020-08-15 DIAGNOSIS — R079 Chest pain, unspecified: Secondary | ICD-10-CM | POA: Diagnosis not present

## 2020-08-15 DIAGNOSIS — I169 Hypertensive crisis, unspecified: Secondary | ICD-10-CM | POA: Diagnosis not present

## 2020-08-15 LAB — RENAL FUNCTION PANEL
Albumin: 3.9 g/dL (ref 3.5–5.0)
Anion gap: 11 (ref 5–15)
BUN: 20 mg/dL (ref 8–23)
CO2: 27 mmol/L (ref 22–32)
Calcium: 9.9 mg/dL (ref 8.9–10.3)
Chloride: 105 mmol/L (ref 98–111)
Creatinine, Ser: 0.98 mg/dL (ref 0.44–1.00)
GFR, Estimated: >60 mL/min (ref >60)
Glucose, Bld: 118 mg/dL — ABNORMAL HIGH (ref 70–99)
Phosphorus: 3 mg/dL (ref 2.5–4.6)
Potassium: 3.6 mmol/L (ref 3.5–5.1)
Sodium: 143 mmol/L (ref 135–145)

## 2020-08-15 MED ORDER — HEPARIN SODIUM (PORCINE) 5000 UNIT/ML IJ SOLN
5000.0000 [IU] | Freq: Three times a day (TID) | INTRAMUSCULAR | Status: DC
  Administered 2020-08-15 – 2020-08-17 (×5): 5000 [IU] via SUBCUTANEOUS
  Filled 2020-08-15 (×4): qty 1

## 2020-08-15 MED ORDER — HYDRALAZINE HCL 20 MG/ML IJ SOLN
5.0000 mg | Freq: Four times a day (QID) | INTRAMUSCULAR | Status: DC | PRN
  Administered 2020-08-15: 5 mg via INTRAVENOUS
  Filled 2020-08-15: qty 1

## 2020-08-15 MED ORDER — DILTIAZEM HCL ER COATED BEADS 240 MG PO CP24
240.0000 mg | ORAL_CAPSULE | Freq: Every day | ORAL | Status: DC
  Administered 2020-08-16 – 2020-08-17 (×2): 240 mg via ORAL
  Filled 2020-08-15: qty 2
  Filled 2020-08-15: qty 1

## 2020-08-15 NOTE — Progress Notes (Addendum)
Progress Note  Patient Name: Jessica [name] Date of Encounter: 08/15/2020  [facility] Cardiologist: Donato Heinz, MD   Subjective   No CP or SOB.  Bp dropped yesterday when she went into ventricular trigeminy and likely was not perfusing PVCs as Bp was elevated significantly an hour before the PVCs started. SBP this am 160-153mmHg.  No CP but complaints of HA.   Inpatient Medications    Scheduled Meds: . amLODipine  10 mg Oral Daily  . aspirin EC  81 mg Oral Daily  . atorvastatin  80 mg Oral Daily  . carvedilol  25 mg Oral BID  . Chlorhexidine Gluconate Cloth  6 each Topical Daily  . cloNIDine  0.2 mg Oral BID  . doxazosin  4 mg Oral Daily  . heparin  5,000 Units Subcutaneous Q8H  . irbesartan  300 mg Oral Daily  . mometasone-formoterol  2 puff Inhalation BID   And  . umeclidinium bromide  1 puff Inhalation Daily  . mupirocin ointment  1 application Nasal BID  . pneumococcal 23 valent vaccine  0.5 mL Intramuscular Tomorrow-1000  . senna-docusate  2 tablet Oral BID  . sertraline  25 mg Oral Daily  . spironolactone  50 mg Oral Daily  . [START ON 08/17/2020] Vitamin D (Ergocalciferol)  50,000 Units Oral Q7 days   Continuous Infusions:  PRN Meds: acetaminophen, albuterol, bisacodyl, hydrALAZINE, nitroGLYCERIN, ondansetron (ZOFRAN) IV, traZODone   Vital Signs    Vitals:   08/15/20 0500 08/15/20 0700 08/15/20 0730 08/15/20 0830  BP: (!) 204/80 (!) 172/80 (!) 160/79 (!) 169/92  Pulse: 72 82 77 89  Resp: (!) 29 11 13    Temp:      TempSrc:      SpO2: 100% 100% 100% 100%  Weight:      Height:        Intake/Output Summary (Last 24 hours) at 08/15/2020 6195 Last data filed at 08/14/2020 1000 Gross per 24 hour  Intake 500 ml  Output --  Net 500 ml   Last 3 Weights 08/11/2020 08/11/2020 08/06/2020  Weight (lbs) 235 lb 3.2 oz 235 lb 3.2 oz 236 lb  Weight (kg) 106.686 kg 106.686 kg 107.049 kg      Telemetry    NSR- Personally Reviewed  ECG    No  new EKG to review - Personally Reviewed  Physical Exam   GEN: Well nourished, well developed in no acute distress HEENT: Normal NECK: No JVD; No carotid bruits LYMPHATICS: No lymphadenopathy CARDIAC:RRR, no murmurs, rubs, gallops RESPIRATORY:  Clear to auscultation without rales, wheezing or rhonchi  ABDOMEN: Soft, non-tender, non-distended MUSCULOSKELETAL:  No edema; No deformity  SKIN: Warm and dry NEUROLOGIC:  Alert and oriented x 3 PSYCHIATRIC:  Normal affect    Labs    High Sensitivity Troponin:   Recent Labs  Lab 08/11/20 1648 08/11/20 1833  TROPONINIHS 6 6      Chemistry Recent Labs  Lab 08/11/20 1648 08/12/20 0505 08/13/20 1023  NA 141 143 142  K 3.3* 3.4* 3.7  CL 104 107 102  CO2 25 23 28   GLUCOSE 99 134* 103*  BUN 21 19 19   CREATININE 1.14* 1.04* 1.02*  CALCIUM 10.2 9.8 9.9  GFRNONAA 51* 57* 59*  ANIONGAP 12 13 12      Hematology Recent Labs  Lab 08/11/20 1648  WBC 9.7  RBC 5.38*  HGB 15.5*  HCT 47.8*  MCV 88.8  MCH 28.8  MCHC 32.4  RDW 14.6  PLT 201  BNP Recent Labs  Lab 08/11/20 1725  BNP 65.3     DDimer No results for input(s): DDIMER in the last 168 hours.   Radiology    ECHOCARDIOGRAM COMPLETE  Result Date: 08/13/2020    ECHOCARDIOGRAM REPORT   Patient Name:   Jessica [name] Date of Exam: 08/13/2020 Medical Rec #:  [id number]       Height:       68.0 in Accession #:    [id number]      Weight:       235.2 lb Date of Birth:  December 13, 1945      BSA:          2.190 m Patient Age:    74 years        BP:           157/83 mmHg Patient Gender: F               HR:           62 bpm. Exam Location:  Inpatient Procedure: 2D Echo, Cardiac Doppler, Color Doppler and Intracardiac            Opacification Agent Indications:    Chest pain  History:        Patient has no prior history of Echocardiogram examinations. CAD                 and Previous Myocardial Infarction, COPD and Stroke,                 Signs/Symptoms:Chest Pain; Risk  Factors:Hypertension,                 Dyslipidemia and Obesity.  Sonographer:    Dustin Flock Referring Phys: [id number] Garvin  Sonographer Comments: Suboptimal parasternal window and patient is morbidly obese. Image acquisition challenging due to patient body habitus and Image acquisition challenging due to COPD. IMPRESSIONS  1. Left ventricular ejection fraction, by estimation, is 60 to 65%. The left ventricle has normal function. The left ventricle has no regional wall motion abnormalities. Left ventricular diastolic function could not be evaluated.  2. Right ventricular systolic function is normal. The right ventricular size is normal. There is normal pulmonary artery systolic pressure. The estimated right ventricular systolic pressure is 99.8 mmHg.  3. The mitral valve is grossly normal. No evidence of mitral valve regurgitation. No evidence of mitral stenosis.  4. The aortic valve is grossly normal. Aortic valve regurgitation is not visualized. No aortic stenosis is present.  5. The inferior vena cava is normal in size with greater than 50% respiratory variability, suggesting right atrial pressure of 3 mmHg. FINDINGS  Left Ventricle: Left ventricular ejection fraction, by estimation, is 60 to 65%. The left ventricle has normal function. The left ventricle has no regional wall motion abnormalities. Definity contrast agent was given IV to delineate the left ventricular  endocardial borders. The left ventricular internal cavity size was normal in size. There is no left ventricular hypertrophy. Left ventricular diastolic function could not be evaluated due to indeterminate diastolic function. Left ventricular diastolic function could not be evaluated. Right Ventricle: The right ventricular size is normal. No increase in right ventricular wall thickness. Right ventricular systolic function is normal. There is normal pulmonary artery systolic pressure. The tricuspid regurgitant velocity is 2.04 m/s, and   with an assumed right atrial pressure of 3 mmHg, the estimated right ventricular systolic pressure is 33.8 mmHg. Left Atrium: Left atrial size was normal in size. Right Atrium: Right  atrial size was normal in size. Pericardium: Trivial pericardial effusion is present. Presence of pericardial fat pad. Mitral Valve: The mitral valve is grossly normal. No evidence of mitral valve regurgitation. No evidence of mitral valve stenosis. Tricuspid Valve: The tricuspid valve is grossly normal. Tricuspid valve regurgitation is trivial. No evidence of tricuspid stenosis. Aortic Valve: The aortic valve is grossly normal. Aortic valve regurgitation is not visualized. No aortic stenosis is present. Pulmonic Valve: The pulmonic valve was not well visualized. Aorta: The aortic root was not well visualized. Venous: The inferior vena cava is normal in size with greater than 50% respiratory variability, suggesting right atrial pressure of 3 mmHg. IAS/Shunts: The atrial septum is grossly normal.   Diastology LV e' medial:    5.33 cm/s LV E/e' medial:  15.7 LV e' lateral:   9.90 cm/s LV E/e' lateral: 8.4  RIGHT VENTRICLE RV Basal diam:  3.60 cm RV S prime:     7.51 cm/s TAPSE (M-mode): 2.8 cm LEFT ATRIUM             Index       RIGHT ATRIUM           Index LA Vol (A2C):   55.6 ml 25.39 ml/m RA Area:     19.50 cm LA Vol (A4C):   52.8 ml 24.11 ml/m RA Volume:   57.00 ml  26.03 ml/m LA Biplane Vol: 54.8 ml 25.03 ml/m  AORTIC VALVE LVOT Vmax:   94.00 cm/s LVOT Vmean:  69.100 cm/s LVOT VTI:    0.204 m MITRAL VALVE               TRICUSPID VALVE MV Area (PHT): 3.60 cm    TR Peak grad:   16.6 mmHg MV Decel Time: 211 msec    TR Vmax:        204.00 cm/s MV E velocity: 83.60 cm/s MV A velocity: 72.80 cm/s  SHUNTS MV E/A ratio:  1.15        Systemic VTI: 0.20 m Eleonore Chiquito MD Electronically signed by Eleonore Chiquito MD Signature Date/Time: 08/13/2020/2:59:31 PM    Final    VAS US RENAL ARTERY DUPLEX  Result Date: 08/13/2020 ABDOMINAL  VISCERAL Indications: Hypertensive urgency High Risk Factors: Hypertension. Limitations: Air/bowel gas, obesity and patient movement, respiratory disturbance. Comparison Study: No prior studies. Performing Technologist: Oliver Hum RVT  Examination Guidelines: A complete evaluation includes B-mode imaging, spectral Doppler, color Doppler, and power Doppler as needed of all accessible portions of each vessel. Bilateral testing is considered an integral part of a complete examination. Limited examinations for reoccurring indications may be performed as noted.  Duplex Findings: +--------------------+--------+--------+------+------------------+ Mesenteric          PSV cm/sEDV cm/sPlaque     Comments      +--------------------+--------+--------+------+------------------+ Aorta Mid              86      15                            +--------------------+--------+--------+------+------------------+ Celiac Artery Origin                      Unable to insonate +--------------------+--------+--------+------+------------------+ SMA Origin            272      28                            +--------------------+--------+--------+------+------------------+    +------------------+--------+--------+-------+  Right Renal ArteryPSV cm/sEDV cm/sComment +------------------+--------+--------+-------+ Origin              168      39           +------------------+--------+--------+-------+ Proximal            144      34           +------------------+--------+--------+-------+ Mid                  64      17           +------------------+--------+--------+-------+ Distal               57      14           +------------------+--------+--------+-------+ +-----------------+--------+--------+-------+ Left Renal ArteryPSV cm/sEDV cm/sComment +-----------------+--------+--------+-------+ Origin             132      28           +-----------------+--------+--------+-------+ Proximal             34      5            +-----------------+--------+--------+-------+ Mid                 28      7            +-----------------+--------+--------+-------+ Distal              39      13           +-----------------+--------+--------+-------+  Technologist observations: There are cystic appearing structures noted within the right kidney. The largest structure measures 4.1 cm high by 5.3 cm wide by 5.1 cm long. +------------+--------+--------+----+-----------+--------+--------+----+ Right KidneyPSV cm/sEDV cm/sRI  Left KidneyPSV cm/sEDV cm/sRI   +------------+--------+--------+----+-----------+--------+--------+----+ Upper Pole  32      8       0.74Upper Pole 45      13      0.72 +------------+--------+--------+----+-----------+--------+--------+----+ Mid         43      12      0.73Mid        39      10      0.74 +------------+--------+--------+----+-----------+--------+--------+----+ Lower Pole  31      8       0.73Lower Pole 34      12      0.67 +------------+--------+--------+----+-----------+--------+--------+----+ Hilar       89      18      0.80Hilar      55      11      0.80 +------------+--------+--------+----+-----------+--------+--------+----+ +------------------+-----+------------------+-----+ Right Kidney           Left Kidney             +------------------+-----+------------------+-----+ RAR                    RAR                     +------------------+-----+------------------+-----+ RAR (manual)      1.95 RAR (manual)      1.53  +------------------+-----+------------------+-----+ Cortex                 Cortex                  +------------------+-----+------------------+-----+ Cortex thickness       Corex thickness         +------------------+-----+------------------+-----+  Kidney length (cm)12.00Kidney length (cm)11.00 +------------------+-----+------------------+-----+  Summary: Renal:  Right: Normal  size right kidney. Abnormal right Resistive Index. RRV        flow present. 1-59% stenosis of the right renal artery. There        are cystic appearing structures noted within the right        kidney. The lagest structure measures 4.1 cm high by 5.3 cm        wide by 5.1 cm long. Left:  Normal size of left kidney. Abnormal left Resistive Index.        LRV flow present. 1-59% stenosis of the left renal artery. Mesenteric: Areas of limited visceral study include mesenteric arteries.  *See table(s) above for measurements and observations.  Diagnosing physician: Deitra Mayo MD  Electronically signed by Deitra Mayo MD on 08/13/2020 at 1:04:24 PM.    Final     Cardiac Studies   2D echo 07/2020 IMPRESSIONS    1. Left ventricular ejection fraction, by estimation, is 60 to 65%. The  left ventricle has normal function. The left ventricle has no regional  wall motion abnormalities. Left ventricular diastolic function could not  be evaluated.  2. Right ventricular systolic function is normal. The right ventricular  size is normal. There is normal pulmonary artery systolic pressure. The  estimated right ventricular systolic pressure is 16.1 mmHg.  3. The mitral valve is grossly normal. No evidence of mitral valve  regurgitation. No evidence of mitral stenosis.  4. The aortic valve is grossly normal. Aortic valve regurgitation is not  visualized. No aortic stenosis is present.  5. The inferior vena cava is normal in size with greater than 50%  respiratory variability, suggesting right atrial pressure of 3 mmHg.   Patient Profile     74 y.o. female with PMH of CVA, COPD, HTN, HLD, CKD stage III, breast CA, and CAD s/p PCI presented with chest pain in the setting of hypertensive urgency  Assessment & Plan    1. Chest pain - feels different compare to previous angina, likely demand ischemia in the setting of hypertensive urgency - serial enzyme negative - no plan for ischemic  workup unless chest pain recurs despite good BP control or abnormal echo.  -2D echo with normal LVF  2. Hypertensive urgency - pending workup for secondary hypertension with 24 hour urine with metanephrine, aldosterone +renin w/ ratio and urine cortisol -renal dopplers with 1-59% bilateral RAS -TSH normal - Home meds include 25mg  BID coreg, HCTZ 25mg  daily, clonidine 0.2 mg BID, cardura 2mg  BID, valsartan 160mg  BID - hospital meds include 25mg  BID coreg, clonidine 0.2mg  BID, cardura 4mg  daily, irbesartan 300mg  daily, amlodipine 10mg  daily, +spironolactone 50mg  daily (home HCTZ was swtiched to spironolactone to help with hypokalemia) -BP remain in the 160-170s >>no further episodes of low BP and suspect that her bigeminal PVCs she was having yesterday were not perfusing and resulting in low BP and this has resolved - SCr is normal at 0.98 -renal consulted and felt patient may have hyperaldo state but labs pending.   -Also concerned about possibility of pheo given her persistently elevated HR and HA (HA could be explained by poor BP control) -will increase Spironolactone to 50mg  daily to help with low K as well has HTN -since she is still having PVCs and HR remains in the 90's, will stop amlodipine and change to Cardizem CD 240mg  daily starting tomorrow and titrate as needed for BP control  3. Headache:  -likely related to hypertensive  urgency. CT of head negative for acute etiology  4. CAD:  -no anginal sx>>CP this admit not like her prior angina -hsTrop normal -aspirin and statin  5. HLD:  -LDL goal < 70 -LDL 64 on lipitor     I have spent a total of 35 minutes with patient reviewing 2D echo, renal dopplers , telemetry, EKGs, labs and examining patient as well as establishing an assessment and plan that was discussed with the patient.  > 50% of time was spent in direct patient care.    For questions or updates, please contact [place] Please consult [email or web address] for contact  info under        Signed, Fransico Him, MD  08/15/2020, 8:38 AM

## 2020-08-15 NOTE — Progress Notes (Signed)
Updated the patient's sister in law via phone [phone] Mrs. Olena Heckle via phone.  All questions answered to the best of my ability.

## 2020-08-15 NOTE — Progress Notes (Signed)
PROGRESS NOTE  Jessica [name] QIW:[id number] DOB: 1946/06/27 DOA: 08/11/2020 PCP: Ronnald Nian, DO  HPI/Recap of past 24 hours: Jessica [name]  is a 74 y.o. female, with history of CVA, hypertension, hyperlipidemia, COPD, CHF, breast cancer, asthma, CAD s/p PCI with stent x 2, who presents to the ED with a chief complaint of chest pain that awakened her on the morning of her presentation.  She went to her cardiologist first and there she was found to be severely hypertensive.  Due to concern for possible ACS she was sent to the ED for further evaluation.  She was started on nicardipine drip in the ED with improvement of her uncontrolled HTN.  Troponin S negative x2.  No evidence of acute ischemia on 12 lead EKG.  Hospital course complicated by severe headache stating it feels like the headache she had prior to her diagnosis of stroke.  CT head non contrast pending.    Renal artery Doppler ultrasound showing 1 to 59% bilateral renal artery stenosis and 2D echo completed on 08/13/20 showing normal LVEF.  08/15/20: Labile blood pressure with intermittent headaches.  She reports having a headache at the time of this visit, improved with Tylenol.  Assessment/Plan: Active Problems:   Hypertensive crisis  Resolved chest pain rule out ACS Initially presented to her cardiologist with complaints of chest pain, found to be severely hypertensive and was sent to the ED for further evaluation. Troponin S- x2 No evidence of acute ischemia on twelve-lead EKG Started on nicardipine drip for hypertensive crisis, was stopped on 08/12/2020 2D echo showing normal LVEF. At the time of this visit.  Chest x-ray had resolved. Cardiology following.  Hypertensive urgency, BP is currently labile Presented with severely elevated BP Received nicardipine drip on admission, this was discontinued on 08/12/2020. Norvasc was added by cardiology, currently on 10 mg daily, spironolactone 50 mg daily Home HCTZ  was discontinued. Currently on home p.o. antihypertensives: Coreg 25 mg twice daily, clonidine 0.2 mg twice daily, doxazosin 4 mg daily, ibesartan 300 mg daily. Renal artery Doppler ultrasound completed 1 to 59% renal artery stenosis bilaterally. Seen by nephrology, can follow-up outpatient. BP has been labile, ongoing adjustment of blood pressure medications by cardiology.  Right renal cysts/bilateral renal artery stenosis 1 to 59% Renal artery Doppler ultrasound completed on 08/13/2020 Nephrology consulted  Coronary artery disease status post PCI with stent x2 History of MI Follows with cardiology outpatient Currently denies chest pain Continue antiplatelet, statin, and beta-blocker  Intermittent headache with prior history of CVA and ongoing management of hypertensive urgency. CT head negative for any acute intracranial findings. States headaches usually occur with elevated BP Analgesic as needed  History of CVA Continue aspirin and statin Mild residual right lower extremity weakness States she ambulates without assistance  Chronic anxiety/depression Mood Continue home Zoloft  History of asthma Stable Continue home bronchodilators  Hyperlipidemia Continue home Lipitor 80 mg daily  CKD 3A Baseline creatinine appears to be 1.0 with GFR 57 She is at her baseline Monitor urine output Avoid nephrotoxins  Resolved post repletion: Hypokalemia Serum potassium 3.4>3.7 Repleted Magnesium 1.9.  Ambulatory dysfunction PT OT to assess Fall precautions.  Constipation Resolved Continue bowel regimen  Obesity BMI 35 Recommend weight loss outpatient with regular physical activity and healthy dieting.    Code Status: Full code  Family Communication: None at bedside  Disposition Plan: Home likely with home health services RN   Consultants:  Cardiology  Nephrology  Procedures:  None  Antimicrobials:  None  DVT prophylaxis: Subcu Lovenox  daily  Status is: Inpatient    Dispo:  Patient From: Home  Planned Disposition: Home with Health Care Svc  Expected discharge date: 08/17/20  Medically stable for discharge: No, ongoing management of hypertensive urgency.        Objective: Vitals:   08/15/20 1100 08/15/20 1130 08/15/20 1200 08/15/20 1230  BP: 140/64 139/63 137/61 (!) 147/52  Pulse: 65 72 61 69  Resp: 11 16 (!) 29 (!) 29  Temp:   98.3 F (36.8 C)   TempSrc:   Oral   SpO2: 100% 100% 99% 100%  Weight:      Height:        Intake/Output Summary (Last 24 hours) at 08/15/2020 1715 Last data filed at 08/15/2020 1300 Gross per 24 hour  Intake --  Output 550 ml  Net -550 ml   Filed Weights   08/11/20 1709  Weight: 106.7 kg    Exam:  . General: 74 y.o. year-old female well-developed well-nourished no acute stress.  Alert oriented x3.   . Cardiovascular: Regular rate and rhythm no rubs or gallops.  Marland Kitchen Respiratory: Clear to auscultation no wheezes or rales.   . Abdomen: Obese nontender normal bowel sounds present. . Musculoskeletal: No lower extremity edema bilaterally.  Marland Kitchen Psychiatry: Mood is appropriate for condition and setting.  Data Reviewed: CBC: Recent Labs  Lab 08/11/20 1648  WBC 9.7  HGB 15.5*  HCT 47.8*  MCV 88.8  PLT 269   Basic Metabolic Panel: Recent Labs  Lab 08/11/20 1648 08/12/20 0505 08/13/20 1023 08/14/20 1016 08/15/20 0817  NA 141 143 142  --  143  K 3.3* 3.4* 3.7  --  3.6  CL 104 107 102  --  105  CO2 25 23 28   --  27  GLUCOSE 99 134* 103*  --  118*  BUN 21 19 19   --  20  CREATININE 1.14* 1.04* 1.02*  --  0.98  CALCIUM 10.2 9.8 9.9  --  9.9  MG  --  1.9  --  1.9  --   PHOS  --   --   --   --  3.0   GFR: Estimated Creatinine Clearance: 67.3 mL/min (by C-G formula based on SCr of 0.98 mg/dL). Liver Function Tests: Recent Labs  Lab 08/15/20 0817  ALBUMIN 3.9   No results for input(s): LIPASE, AMYLASE in the last 168 hours. No results for input(s): AMMONIA in  the last 168 hours. Coagulation Profile: No results for input(s): INR, PROTIME in the last 168 hours. Cardiac Enzymes: No results for input(s): CKTOTAL, CKMB, CKMBINDEX, TROPONINI in the last 168 hours. BNP (last 3 results) No results for input(s): PROBNP in the last 8760 hours. HbA1C: No results for input(s): HGBA1C in the last 72 hours. CBG: No results for input(s): GLUCAP in the last 168 hours. Lipid Profile: No results for input(s): CHOL, HDL, LDLCALC, TRIG, CHOLHDL, LDLDIRECT in the last 72 hours. Thyroid Function Tests: No results for input(s): TSH, T4TOTAL, FREET4, T3FREE, THYROIDAB in the last 72 hours. Anemia Panel: No results for input(s): VITAMINB12, FOLATE, FERRITIN, TIBC, IRON, RETICCTPCT in the last 72 hours. Urine analysis: No results found for: COLORURINE, APPEARANCEUR, LABSPEC, PHURINE, GLUCOSEU, HGBUR, BILIRUBINUR, KETONESUR, PROTEINUR, UROBILINOGEN, NITRITE, LEUKOCYTESUR Sepsis Labs: @LABRCNTIP (procalcitonin:4,lacticidven:4)  ) Recent Results (from the past 240 hour(s))  Respiratory Panel by RT PCR (Flu A&B, Covid) - Nasopharyngeal Swab     Status: None   Collection Time: 08/11/20  5:22 PM   Specimen: Nasopharyngeal  Swab  Result Value Ref Range Status   SARS Coronavirus 2 by RT PCR NEGATIVE NEGATIVE Final    Comment: (NOTE) SARS-CoV-2 target nucleic acids are NOT DETECTED.  The SARS-CoV-2 RNA is generally detectable in upper respiratoy specimens during the acute phase of infection. The lowest concentration of SARS-CoV-2 viral copies this assay can detect is 131 copies/mL. A negative result does not preclude SARS-Cov-2 infection and should not be used as the sole basis for treatment or other patient management decisions. A negative result may occur with  improper specimen collection/handling, submission of specimen other than nasopharyngeal swab, presence of viral mutation(s) within the areas targeted by this assay, and inadequate number of viral  copies (<131 copies/mL). A negative result must be combined with clinical observations, patient history, and epidemiological information. The expected result is Negative.  Fact Sheet for Patients:  PinkCheek.be  Fact Sheet for Healthcare Providers:  GravelBags.it  This test is no t yet approved or cleared by the [place] FDA and  has been authorized for detection and/or diagnosis of SARS-CoV-2 by FDA under an Emergency Use Authorization (EUA). This EUA will remain  in effect (meaning this test can be used) for the duration of the COVID-19 declaration under Section 564(b)(1) of the Act, 21 U.S.C. section 360bbb-3(b)(1), unless the authorization is terminated or revoked sooner.     Influenza A by PCR NEGATIVE NEGATIVE Final   Influenza B by PCR NEGATIVE NEGATIVE Final    Comment: (NOTE) The Xpert Xpress SARS-CoV-2/FLU/RSV assay is intended as an aid in  the diagnosis of influenza from Nasopharyngeal swab specimens and  should not be used as a sole basis for treatment. Nasal washings and  aspirates are unacceptable for Xpert Xpress SARS-CoV-2/FLU/RSV  testing.  Fact Sheet for Patients: PinkCheek.be  Fact Sheet for Healthcare Providers: GravelBags.it  This test is not yet approved or cleared by the [place] FDA and  has been authorized for detection and/or diagnosis of SARS-CoV-2 by  FDA under an Emergency Use Authorization (EUA). This EUA will remain  in effect (meaning this test can be used) for the duration of the  Covid-19 declaration under Section 564(b)(1) of the Act, 21  U.S.C. section 360bbb-3(b)(1), unless the authorization is  terminated or revoked. Performed at [facility], [place] [address], [place], [place] [address]   MRSA PCR Screening     Status: None   Collection Time: 08/12/20 12:54 PM   Specimen: Nasopharyngeal   Result Value Ref Range Status   MRSA by PCR NEGATIVE NEGATIVE Final    Comment:        The GeneXpert MRSA Assay (FDA approved for NASAL specimens only), is one component of a comprehensive MRSA colonization surveillance program. It is not intended to diagnose MRSA infection nor to guide or monitor treatment for MRSA infections. Performed at [facility], [place] [address], [place], [place] [address]       Studies: No results found.  Scheduled Meds: . aspirin EC  81 mg Oral Daily  . atorvastatin  80 mg Oral Daily  . carvedilol  25 mg Oral BID  . Chlorhexidine Gluconate Cloth  6 each Topical Daily  . cloNIDine  0.2 mg Oral BID  . [START ON 08/16/2020] diltiazem  240 mg Oral Daily  . doxazosin  4 mg Oral Daily  . heparin  5,000 Units Subcutaneous Q8H  . irbesartan  300 mg Oral Daily  . mometasone-formoterol  2 puff Inhalation BID   And  . umeclidinium  bromide  1 puff Inhalation Daily  . mupirocin ointment  1 application Nasal BID  . pneumococcal 23 valent vaccine  0.5 mL Intramuscular Tomorrow-1000  . senna-docusate  2 tablet Oral BID  . sertraline  25 mg Oral Daily  . spironolactone  50 mg Oral Daily  . [START ON 08/17/2020] Vitamin D (Ergocalciferol)  50,000 Units Oral Q7 days    Continuous Infusions:    LOS: 3 days     Kayleen Memos, MD [facility] Pager [phone]  If 7PM-7AM, please contact night-coverage [email or web address] Password [email or web address] 08/15/2020, 5:15 PM

## 2020-08-16 ENCOUNTER — Encounter (HOSPITAL_COMMUNITY): Payer: Self-pay | Admitting: Internal Medicine

## 2020-08-16 ENCOUNTER — Inpatient Hospital Stay (HOSPITAL_COMMUNITY): Payer: Medicare Other

## 2020-08-16 DIAGNOSIS — I2510 Atherosclerotic heart disease of native coronary artery without angina pectoris: Secondary | ICD-10-CM | POA: Diagnosis not present

## 2020-08-16 DIAGNOSIS — I169 Hypertensive crisis, unspecified: Secondary | ICD-10-CM | POA: Diagnosis not present

## 2020-08-16 LAB — RENAL FUNCTION PANEL
Albumin: 3.4 g/dL — ABNORMAL LOW (ref 3.5–5.0)
Anion gap: 7 (ref 5–15)
BUN: 22 mg/dL (ref 8–23)
CO2: 31 mmol/L (ref 22–32)
Calcium: 9.5 mg/dL (ref 8.9–10.3)
Chloride: 104 mmol/L (ref 98–111)
Creatinine, Ser: 1.11 mg/dL — ABNORMAL HIGH (ref 0.44–1.00)
GFR, Estimated: 53 mL/min — ABNORMAL LOW (ref >60)
Glucose, Bld: 107 mg/dL — ABNORMAL HIGH (ref 70–99)
Phosphorus: 3.3 mg/dL (ref 2.5–4.6)
Potassium: 3.7 mmol/L (ref 3.5–5.1)
Sodium: 142 mmol/L (ref 135–145)

## 2020-08-16 NOTE — Progress Notes (Signed)
Attempted to call to receive report from Nurse 3 times.  Will wait to receive call.

## 2020-08-16 NOTE — TOC Progression Note (Signed)
Transition of Care [facility]) - Progression Note    Patient Details  Name: Jessica [name] MRN: [id number] Date of Birth: [birth year]  Transition of Care [facility]) CM/SW Contact  Leeroy Cha, RN Phone Number: 08/16/2020, 8:01 AM  Clinical Narrative:    74 y.o.female,with history of CVA, hypertension, hyperlipidemia, COPD, CHF, breast cancer, asthma, CAD s/p PCI with stent x 2, who presents to the ED with a chief complaint of chest pain that awakened her on the morning of her presentation.  She went to her cardiologist first and there she was found to be severely hypertensive.  Due to concern for possible ACS she was sent to the ED for further evaluation.  She was started on nicardipine drip in the ED with improvement of her uncontrolled HTN.  Troponin S negative x2.  No evidence of acute ischemia on 12 lead EKG.  Hospital course complicated by severe headache stating it feels like the headache she had prior to her diagnosis of stroke.  CT head non contrast pending.    Renal artery Doppler ultrasound showing 1 to 59% bilateral renal artery stenosis and 2D echo completed on 08/13/20 showing normal LVEF.  08/15/20: Labile blood pressure with intermittent headaches.  She reports having a headache at the time of this visit, improved with Tylenol. Plan home hhc with RN for bld pressure checks.  Expected Discharge Plan: Home/Self Care Barriers to Discharge: Barriers Unresolved (comment) (iv cardene hypertensive crisis)  Expected Discharge Plan and Services Expected Discharge Plan: Home/Self Care   Discharge Planning Services: CM Consult   Living arrangements for the past 2 months: Single Family Home                                       Social Determinants of Health (SDOH) Interventions    Readmission Risk Interventions No flowsheet data found.

## 2020-08-16 NOTE — Progress Notes (Signed)
Progress Note  Patient Name: Jessica [name] Date of Encounter: 08/16/2020  [facility] Cardiologist: Donato Heinz, MD   Subjective   No acute overnight events. Feels good taking to brother on phone   Inpatient Medications    Scheduled Meds: . aspirin EC  81 mg Oral Daily  . atorvastatin  80 mg Oral Daily  . carvedilol  25 mg Oral BID  . Chlorhexidine Gluconate Cloth  6 each Topical Daily  . cloNIDine  0.2 mg Oral BID  . diltiazem  240 mg Oral Daily  . doxazosin  4 mg Oral Daily  . heparin  5,000 Units Subcutaneous Q8H  . irbesartan  300 mg Oral Daily  . mometasone-formoterol  2 puff Inhalation BID   And  . umeclidinium bromide  1 puff Inhalation Daily  . mupirocin ointment  1 application Nasal BID  . pneumococcal 23 valent vaccine  0.5 mL Intramuscular Tomorrow-1000  . senna-docusate  2 tablet Oral BID  . sertraline  25 mg Oral Daily  . spironolactone  50 mg Oral Daily  . [START ON 08/17/2020] Vitamin D (Ergocalciferol)  50,000 Units Oral Q7 days   Continuous Infusions:  PRN Meds: acetaminophen, albuterol, bisacodyl, hydrALAZINE, nitroGLYCERIN, ondansetron (ZOFRAN) IV, traZODone   Vital Signs    Vitals:   08/16/20 0400 08/16/20 0500 08/16/20 0600 08/16/20 0700  BP: (!) 161/68 (!) 166/96 126/82   Pulse: 73 73 77 (!) 59  Resp: 19 (!) 24 11 (!) 23  Temp: 98 F (36.7 C)     TempSrc: Oral     SpO2: 100% 100% 100% 100%  Weight:      Height:        Intake/Output Summary (Last 24 hours) at 08/16/2020 0710 Last data filed at 08/16/2020 0400 Gross per 24 hour  Intake 530 ml  Output 1350 ml  Net -820 ml   Last 3 Weights 08/11/2020 08/11/2020 08/06/2020  Weight (lbs) 235 lb 3.2 oz 235 lb 3.2 oz 236 lb  Weight (kg) 106.686 kg 106.686 kg 107.049 kg      Telemetry    NSR 08/16/2020  - Personally Reviewed  ECG    No new ECG tracing today. - Personally Reviewed  Physical Exam  Overweight black female  GEN: No acute distress.   Neck: No  JVD Cardiac: RRR, no murmurs, rubs, or gallops.  Respiratory: Clear to auscultation bilaterally. GI: Soft, nontender, non-distended  MS: No edema; No deformity. Neuro:  Nonfocal  Psych: Normal affect   Labs    High Sensitivity Troponin:   Recent Labs  Lab 08/11/20 1648 08/11/20 1833  TROPONINIHS 6 6      Chemistry Recent Labs  Lab 08/13/20 1023 08/15/20 0817 08/16/20 0245  NA 142 143 142  K 3.7 3.6 3.7  CL 102 105 104  CO2 28 27 31   GLUCOSE 103* 118* 107*  BUN 19 20 22   CREATININE 1.02* 0.98 1.11*  CALCIUM 9.9 9.9 9.5  ALBUMIN  --  3.9 3.4*  GFRNONAA 59* >60 53*  ANIONGAP 12 11 7      Hematology Recent Labs  Lab 08/11/20 1648  WBC 9.7  RBC 5.38*  HGB 15.5*  HCT 47.8*  MCV 88.8  MCH 28.8  MCHC 32.4  RDW 14.6  PLT 201    BNP Recent Labs  Lab 08/11/20 1725  BNP 65.3     DDimer No results for input(s): DDIMER in the last 168 hours.   Radiology    No results found.  Cardiac Studies  Echocardiogram 08/13/2020: 1. Left ventricular ejection fraction, by estimation, is 60 to 65%. The  left ventricle has normal function. The left ventricle has no regional  wall motion abnormalities. Left ventricular diastolic function could not  be evaluated.  2. Right ventricular systolic function is normal. The right ventricular  size is normal. There is normal pulmonary artery systolic pressure. The  estimated right ventricular systolic pressure is 16.1 mmHg.  3. The mitral valve is grossly normal. No evidence of mitral valve  regurgitation. No evidence of mitral stenosis.  4. The aortic valve is grossly normal. Aortic valve regurgitation is not  visualized. No aortic stenosis is present.  5. The inferior vena cava is normal in size with greater than 50%  respiratory variability, suggesting right atrial pressure of 3 mmHg.  _______________  Renal Ultrasound 08/13/2020: Summary:  Renal:  - Right: Normal size right kidney. Abnormal right Resistive  Index. RRV flow present. 1-59% stenosis of the right renal artery. There are cystic appearing structures noted within the right kidney. The lagest structure measures 4.1 cm high by 5.3 cm wide by 5.1 cm long.  - Left: Normal size of left kidney. Abnormal left Resistive Index. LRV flow present. 1-59% stenosis of the left renal artery.   Mesenteric:  Areas of limited visceral study include mesenteric arteries.    Patient Profile     74 y.o. female with a history of CAD s/p PCI x2 (most recently DES to LAD in 2017), CVA, COPD, hypertension, hyperlipidemia, and breast cancer. Patient was seen by Dr. Gardiner Rhyme in the office on 08/11/2020 with chest pain felt to be secondary to hypertensive urgency with BP of 208/100. Patient was transferred to [facility] ED and was admitted for further evaluation/management of this.   Assessment & Plan    Chest Pain - Felt to be secondary to hypertensive urgency with BP >200/100. Chest pain resolved with improvement in BP. - High-sensitivity troponin negative x2. - EKG shows no acute ischemic changes.  - Echo shows LVEF of 60-65% with normal wall motion. - plan for outpatient w/u with Dr Gardiner Rhyme after d/c ? Myovue   CAD  - S/p PC x2. Most recently DES to LAD in 2017. - Chest pain on admission was not like her angina in the past.  - Continue aspirin, beta-blocker, and statin.  Hypertensive Urgency - Initial BP >208/100. BP still very labile. BP in the last 24 hours ranged from 120/80's to 190's/100's but mild improvement. Most recent BP 126/82. - Renal dopplers showed 1-59% bilateral renal artery stenosis. - TSH normal.  - Home medications include Coreg 25mg  twice daily, HCTZ 25mg  daily, Clonidine 0.2mg  twice daily, Cardura 2mg  twice daily, and Valsartan 160mg  twice daily.  [facility] medications include Coreg 25mg  twice daily, Cardura 4mg  daily, Clonidine 0.2mg  twice daily, Irbesartan 300mg  daily, Amlodipine 50mg  daily, and Spironolactone 50mg  daily (home  HCTZ was switched to Spironolactone to help with hypokalemia). - Work-up for secondary hypertension pending including 24 hour urine with metanephrine, aldosterone  + renin with ration, and urine cortisol. Nephrology consulted for assistance. There is also some concern for possible pheochromocytoma but doubtful     Hyperlipidemia - Lipid panel this admission: Total Cholesterol 128, Triglycerides 95, HDL 45, LDL 64. - Continue Lipitor 80mg  daily.  Cardiology will sign off and arrange outpatient f/u with Dr Gardiner Rhyme for CAD Hospitalist and renal should be able to manage BP issues    For questions or updates, please contact [place] Please consult [email or web address] for contact info  under     Jenkins Rouge MD [facility]

## 2020-08-16 NOTE — TOC Progression Note (Signed)
Transition of Care [facility]) - Progression Note    Patient Details  Name: Jessica [name] MRN: [id number] Date of Birth: 09-Apr-1946  Transition of Care [facility]) CM/SW Contact  Leeroy Cha, RN Phone Number: 08/16/2020, 8:09 AM  Clinical Narrative:    Pt to go home with rn for bp and medicine checks. Will arrange for hhc. Information sent to [email or web address]. Via phone Eston Esters left.  Expected Discharge Plan: [place] Barriers to Discharge: Barriers Unresolved (comment) (iv cardene hypertensive crisis)  Expected Discharge Plan and Services Expected Discharge Plan: [place]   Discharge Planning Services: CM Consult   Living arrangements for the past 2 months: Single Family Home                                       Social Determinants of Health (SDOH) Interventions    Readmission Risk Interventions No flowsheet data found.

## 2020-08-16 NOTE — Progress Notes (Addendum)
PROGRESS NOTE  Cing [place] OJJ:[id number] DOB: Jul 30, 1946 DOA: 08/11/2020 PCP: Ronnald Nian, DO  HPI/Recap of past 24 hours: Jessica [name]  is a 74 y.o. female, with history of CVA, hypertension, hyperlipidemia, COPD, CHF, breast cancer, asthma, CAD s/p PCI with stent x 2, who presents to the ED with a chief complaint of chest pain that awakened her on the morning of her presentation.  She went to her cardiologist first and there she was found to be severely hypertensive.  Due to concern for possible ACS she was sent to [facility] ED for further evaluation.  She was started on nicardipine drip in the ED with improvement of her uncontrolled HTN.  Troponin S were negative x2.  There were no evidence of acute ischemia on 12 lead EKG.  Hospital course complicated by severe headache stating it feels like the headache she had prior to her diagnosis of stroke.  CT head non contrast showed no evidence of acute intracranial findings.    Her blood pressure has been labile and difficult to control.  Cardiology and nephrology were consulted.  Nephrology signed off on 08/14/2020 and recommended outpatient follow-up for management of her hypertension.  Cardiology following and making adjustments to her oral antihypertensives.  Renal artery Doppler ultrasound done on 08/13/2020 showed 1 to 59% bilateral renal artery stenosis.  2D echo completed on 08/13/20 showed normal LVEF.  08/16/20: Blood pressure has been labile.  BP not at goal.  No headache this morning.  Assessment/Plan: Active Problems:   Hypertensive crisis  Resolved chest pain, ruled out ACS Initially presented to her cardiologist with complaints of chest pain, found to be severely hypertensive and was sent to the ED for further evaluation. Troponin S- x2 No evidence of acute ischemia on twelve-lead EKG In the ED started on nicardipine drip for hypertensive crisis, was stopped on 08/12/2020 2D echo done on 08/13/2020- normal LVEF. No  recurrent chest pain in the last 24-48 hours. Continuous monitoring on telemetry  Malignant hypertension, BP is currently labile and not at goal Presented with severely elevated BP On 5 blood pressure medications at home including clonidine and HCTZ prior to admission In the ED received nicardipine drip on admission, this was discontinued on 08/12/2020. TSH normal 1.6 She is currently on Coreg 25 mg twice daily, clonidine 0.2 mg twice daily, p.o. Cardizem to 40 mg daily, doxazosin 4 mg daily, irbesartan 300 mg daily, spironolactone 50 mg daily, Renal artery Doppler ultrasound completed on 08/13/2020 showed 1 to 59% renal artery stenosis bilaterally. Seen by nephrology, Dr. Moshe Cipro signed off, will follow-up outpatient. BP has been labile, ongoing adjustment of blood pressure medications by cardiology.  BP not at goal. Continue to closely monitor vital signs  Abdominal bulging, distention, with discomfort She has had previous abdominal surgeries x2, a C-section, reoperated. Constipation has resolved, had a bowel movement today Patient is concerned about her abdominal bulging to the left We will obtain a portable abdominal x-ray.  Right renal cysts/bilateral renal artery stenosis 1 to 59% Renal artery Doppler ultrasound completed on 08/13/2020 Seen by nephrology, will follow up outpatient.  Coronary artery disease status post PCI with stent x2 History of MI Follows with cardiology outpatient Currently denies chest pain Continue antiplatelet, statin, and beta-blocker  Intermittent headache with prior history of CVA and ongoing management of hypertensive urgency. CT head negative for any acute intracranial findings. States headaches usually occur with elevated BP Analgesic as needed  History of CVA Continue aspirin and statin Mild residual right lower  extremity weakness States she ambulates without assistance  Chronic anxiety/depression Mood Continue home Zoloft  History  of asthma Stable Continue home bronchodilators  Hyperlipidemia Continue home Lipitor 80 mg daily  CKD 3A Baseline creatinine appears to be 1.0 with GFR 57 Creatinine slightly up trending, continue to closely monitor and avoid nephrotoxins Repeat renal panel in the morning  Resolved post repletion: Hypokalemia Serum potassium 3.4>3.7 Repleted Magnesium 1.9.  Ambulatory dysfunction PT OT to assess Fall precautions. Continue PT OT with assistance and fall precautions  Constipation Resolved Continue bowel regimen  Obesity BMI 35 Recommend weight loss outpatient with regular physical activity and healthy dieting.    Code Status: Full code  Family Communication: Updated her sister-in-law via phone on 08/15/2020.  Disposition Plan: Home likely with home health services RN   Consultants:  Cardiology  Nephrology, signed off on 08/14/2020.  Procedures:  None  Antimicrobials:  None  DVT prophylaxis: Subcu Lovenox daily  Status is: Inpatient    Dispo:  Patient From: Home  Planned Disposition: Home with Health Care Svc  Expected discharge date: 08/17/20  Medically stable for discharge: No, ongoing management of malignant hypertension.        Objective: Vitals:   08/16/20 0800 08/16/20 0900 08/16/20 1000 08/16/20 1100  BP: (!) 168/68 (!) 164/70 (!) 164/83 (!) 153/68  Pulse: (!) 59 85 73 69  Resp: (!) 27 16    Temp: 98.4 F (36.9 C)     TempSrc: Oral     SpO2: 100% 100% 100% 100%  Weight:      Height:        Intake/Output Summary (Last 24 hours) at 08/16/2020 1154 Last data filed at 08/16/2020 0400 Gross per 24 hour  Intake 440 ml  Output 1100 ml  Net -660 ml   Filed Weights   08/11/20 1709  Weight: 106.7 kg    Exam:  . General: 74 y.o. year-old female pleasant obese in no acute distress.  Alert and oriented x3. . Cardiovascular: Regular rate and rhythm no rubs or gallops.  Marland Kitchen Respiratory: Clear to auscultation no wheezing no rales.    . Abdomen: Obese, soft, mildly bulging on the left, discomfort with palpation.  Bowel sounds present. . Musculoskeletal: No lower extremity edema bilaterally. Marland Kitchen Psychiatry: Mood is appropriate for condition and setting.  Data Reviewed: CBC: Recent Labs  Lab 08/11/20 1648  WBC 9.7  HGB 15.5*  HCT 47.8*  MCV 88.8  PLT 831   Basic Metabolic Panel: Recent Labs  Lab 08/11/20 1648 08/12/20 0505 08/13/20 1023 08/14/20 1016 08/15/20 0817 08/16/20 0245  NA 141 143 142  --  143 142  K 3.3* 3.4* 3.7  --  3.6 3.7  CL 104 107 102  --  105 104  CO2 25 23 28   --  27 31  GLUCOSE 99 134* 103*  --  118* 107*  BUN 21 19 19   --  20 22  CREATININE 1.14* 1.04* 1.02*  --  0.98 1.11*  CALCIUM 10.2 9.8 9.9  --  9.9 9.5  MG  --  1.9  --  1.9  --   --   PHOS  --   --   --   --  3.0 3.3   GFR: Estimated Creatinine Clearance: 59.4 mL/min (A) (by C-G formula based on SCr of 1.11 mg/dL (H)). Liver Function Tests: Recent Labs  Lab 08/15/20 0817 08/16/20 0245  ALBUMIN 3.9 3.4*   No results for input(s): LIPASE, AMYLASE in the last 168 hours.  No results for input(s): AMMONIA in the last 168 hours. Coagulation Profile: No results for input(s): INR, PROTIME in the last 168 hours. Cardiac Enzymes: No results for input(s): CKTOTAL, CKMB, CKMBINDEX, TROPONINI in the last 168 hours. BNP (last 3 results) No results for input(s): PROBNP in the last 8760 hours. HbA1C: No results for input(s): HGBA1C in the last 72 hours. CBG: No results for input(s): GLUCAP in the last 168 hours. Lipid Profile: No results for input(s): CHOL, HDL, LDLCALC, TRIG, CHOLHDL, LDLDIRECT in the last 72 hours. Thyroid Function Tests: No results for input(s): TSH, T4TOTAL, FREET4, T3FREE, THYROIDAB in the last 72 hours. Anemia Panel: No results for input(s): VITAMINB12, FOLATE, FERRITIN, TIBC, IRON, RETICCTPCT in the last 72 hours. Urine analysis: No results found for: COLORURINE, APPEARANCEUR, LABSPEC, PHURINE,  GLUCOSEU, HGBUR, BILIRUBINUR, KETONESUR, PROTEINUR, UROBILINOGEN, NITRITE, LEUKOCYTESUR Sepsis Labs: @LABRCNTIP (procalcitonin:4,lacticidven:4)  ) Recent Results (from the past 240 hour(s))  Respiratory Panel by RT PCR (Flu A&B, Covid) - Nasopharyngeal Swab     Status: None   Collection Time: 08/11/20  5:22 PM   Specimen: Nasopharyngeal Swab  Result Value Ref Range Status   SARS Coronavirus 2 by RT PCR NEGATIVE NEGATIVE Final    Comment: (NOTE) SARS-CoV-2 target nucleic acids are NOT DETECTED.  The SARS-CoV-2 RNA is generally detectable in upper respiratoy specimens during the acute phase of infection. The lowest concentration of SARS-CoV-2 viral copies this assay can detect is 131 copies/mL. A negative result does not preclude SARS-Cov-2 infection and should not be used as the sole basis for treatment or other patient management decisions. A negative result may occur with  improper specimen collection/handling, submission of specimen other than nasopharyngeal swab, presence of viral mutation(s) within the areas targeted by this assay, and inadequate number of viral copies (<131 copies/mL). A negative result must be combined with clinical observations, patient history, and epidemiological information. The expected result is Negative.  Fact Sheet for Patients:  PinkCheek.be  Fact Sheet for Healthcare Providers:  GravelBags.it  This test is no t yet approved or cleared by the [place] FDA and  has been authorized for detection and/or diagnosis of SARS-CoV-2 by FDA under an Emergency Use Authorization (EUA). This EUA will remain  in effect (meaning this test can be used) for the duration of the COVID-19 declaration under Section 564(b)(1) of the Act, 21 U.S.C. section 360bbb-3(b)(1), unless the authorization is terminated or revoked sooner.     Influenza A by PCR NEGATIVE NEGATIVE Final   Influenza B by PCR NEGATIVE  NEGATIVE Final    Comment: (NOTE) The Xpert Xpress SARS-CoV-2/FLU/RSV assay is intended as an aid in  the diagnosis of influenza from Nasopharyngeal swab specimens and  should not be used as a sole basis for treatment. Nasal washings and  aspirates are unacceptable for Xpert Xpress SARS-CoV-2/FLU/RSV  testing.  Fact Sheet for Patients: PinkCheek.be  Fact Sheet for Healthcare Providers: GravelBags.it  This test is not yet approved or cleared by the [place] FDA and  has been authorized for detection and/or diagnosis of SARS-CoV-2 by  FDA under an Emergency Use Authorization (EUA). This EUA will remain  in effect (meaning this test can be used) for the duration of the  Covid-19 declaration under Section 564(b)(1) of the Act, 21  U.S.C. section 360bbb-3(b)(1), unless the authorization is  terminated or revoked. Performed at [facility], [place] [address] [place], [place] [address]   MRSA PCR Screening     Status: None   Collection Time: 08/12/20  12:54 PM   Specimen: Nasopharyngeal  Result Value Ref Range Status   MRSA by PCR NEGATIVE NEGATIVE Final    Comment:        The GeneXpert MRSA Assay (FDA approved for NASAL specimens only), is one component of a comprehensive MRSA colonization surveillance program. It is not intended to diagnose MRSA infection nor to guide or monitor treatment for MRSA infections. Performed at [facility], [place] [address] [place], [place] [address]       Studies: No results found.  Scheduled Meds: . aspirin EC  81 mg Oral Daily  . atorvastatin  80 mg Oral Daily  . carvedilol  25 mg Oral BID  . Chlorhexidine Gluconate Cloth  6 each Topical Daily  . cloNIDine  0.2 mg Oral BID  . diltiazem  240 mg Oral Daily  . doxazosin  4 mg Oral Daily  . heparin  5,000 Units Subcutaneous Q8H  . irbesartan  300 mg Oral Daily  . mometasone-formoterol  2 puff  Inhalation BID   And  . umeclidinium bromide  1 puff Inhalation Daily  . mupirocin ointment  1 application Nasal BID  . pneumococcal 23 valent vaccine  0.5 mL Intramuscular Tomorrow-1000  . senna-docusate  2 tablet Oral BID  . sertraline  25 mg Oral Daily  . spironolactone  50 mg Oral Daily  . [START ON 08/17/2020] Vitamin D (Ergocalciferol)  50,000 Units Oral Q7 days    Continuous Infusions:    LOS: 4 days     Kayleen Memos, MD [facility] Pager [phone]  If 7PM-7AM, please contact night-coverage [email or web address] Password [facility] 08/16/2020, 11:54 AM

## 2020-08-16 NOTE — Progress Notes (Signed)
°   08/16/20 2307  Vitals  Temp 98.9 F (37.2 C)  BP (!) 148/73  MAP (mmHg) 93  BP Location Right Arm  BP Method Automatic  Pulse Rate 61  Pulse Rate Source Dinamap  ECG Heart Rate 60  Resp 20  Level of Consciousness  Level of Consciousness Alert  MEWS COLOR  MEWS Score Color Green  Oxygen Therapy  SpO2 100 %  Pain Assessment  Pain Scale 0-10  Pain Score 0  MEWS Score  MEWS Temp 0  MEWS Systolic 0  MEWS Pulse 0  MEWS RR 0  MEWS LOC 0  MEWS Score 0   Central tele called, made nurses aware that patient had gone into AV Block I, EKG preformed and transmitted, MD also made aware. No s/sx of distress noted at this time and no c/o pain or discomfort. Patient is resting with eyes closed, even rise and fall of chest noted. Will continue to monitor while waiting for MD feedback.

## 2020-08-17 DIAGNOSIS — I169 Hypertensive crisis, unspecified: Secondary | ICD-10-CM | POA: Diagnosis not present

## 2020-08-17 LAB — RENAL FUNCTION PANEL
Albumin: 3.7 g/dL (ref 3.5–5.0)
Anion gap: 9 (ref 5–15)
BUN: 18 mg/dL (ref 8–23)
CO2: 30 mmol/L (ref 22–32)
Calcium: 9.7 mg/dL (ref 8.9–10.3)
Chloride: 104 mmol/L (ref 98–111)
Creatinine, Ser: 1.11 mg/dL — ABNORMAL HIGH (ref 0.44–1.00)
GFR, Estimated: 53 mL/min — ABNORMAL LOW (ref >60)
Glucose, Bld: 106 mg/dL — ABNORMAL HIGH (ref 70–99)
Phosphorus: 3.9 mg/dL (ref 2.5–4.6)
Potassium: 4.1 mmol/L (ref 3.5–5.1)
Sodium: 143 mmol/L (ref 135–145)

## 2020-08-17 MED ORDER — ASPIRIN 81 MG PO TBEC: 81.0000 mg | DELAYED_RELEASE_TABLET | Freq: Every day | ORAL | 0 refills | Status: AC

## 2020-08-17 MED ORDER — SPIRONOLACTONE 50 MG PO TABS: 50.0000 mg | ORAL_TABLET | Freq: Every day | ORAL | 0 refills | Status: DC

## 2020-08-17 MED ORDER — DILTIAZEM HCL ER COATED BEADS 240 MG PO CP24: 240.0000 mg | ORAL_CAPSULE | Freq: Every day | ORAL | 0 refills | Status: DC

## 2020-08-17 MED ORDER — IRBESARTAN 300 MG PO TABS: 300.0000 mg | ORAL_TABLET | Freq: Every day | ORAL | 0 refills | Status: DC

## 2020-08-17 NOTE — Progress Notes (Addendum)
PT Cancellation Note  Patient Details Name: Jessica [name] MRN: [id number] DOB: 20-Sep-1946   Cancelled Treatment:    Reason Eval/Treat Not Completed: PT screened, no needs identified, will sign off. Spoke with pt who denies need for PT services- "Im going home. I can walk." Will sign off at pt's request.    Doreatha Massed, PT Acute Rehabilitation  Office: [phone] Pager: [phone]

## 2020-08-17 NOTE — Discharge Summary (Addendum)
Discharge Summary  Jessica [name] XHB:[id number] DOB: 04-29-1946  PCP: Jessica Nian, DO  Admit date: 08/11/2020 Discharge date: 08/17/2020  Time spent: 35 minutes  Recommendations for Outpatient Follow-up:  1. Follow-up with nephrology in 1 to 2 weeks 2. Follow-up with cardiology in 1 to 2 weeks 3. Follow-up with your primary care provider in 1 to 2 weeks 4. Follow-up with your oncologist. 5. Take your medications as prescribed  Discharge Diagnoses:  Active Hospital Problems   Diagnosis Date Noted  . Hypertensive crisis 08/11/2020    Resolved Hospital Problems  No resolved problems to display.    Discharge Condition: Stable  Diet recommendation: Heart healthy diet.  Vitals:   08/17/20 0408 08/17/20 0742  BP: (!) 147/68   Pulse: 64   Resp: 18   Temp: 98.6 F (37 C)   SpO2: 100% 99%    History of present illness:  DeloresSellarsis a74 y.o.female,with history of CVA, hypertension, hyperlipidemia, COPD, chronic diastolic CHF, breast cancer, asthma, CAD s/p PCI with stent x 2-most recently DES to LAD in 2017, who presented to [facility] ED with a chief complaint of chest pain that awakened her on the morning of her presentation.  She went to her cardiologist first and there she was found to be severely hypertensive with BP 208/100.  Due to concern for possible ACS she was sent to [facility] ED for further evaluation.  She was started on nicardipine drip in the ED with improvement of her uncontrolled HTN.  Troponin S were negative x2.  There were no evidence of acute ischemia on 12 lead EKG.  Cardiology was consulted.  2D echo showed normal LVEF.  ACS was ruled out.  Hospital course was complicated by labile BPs and difficult to control BPs for which a work-up was done by cardiology.  Thus far work-up for primary hypertension has revealed bilateral renal artery stenosis 1-59% by renal Dopplers and normal TSH.  Work-up for secondary hypertension is pending including 24-hour urine  with metanephrine, aldosterone and renin with ratio, and urine cortisol.  There is also concern for possible pheochromocytoma.  Nephrology was consulted for assistance and recommended outpatient follow-up for management of her hypertension.    Hospital course also complicated by 1 episode of severe headache that felt like the headache she had prior to her previous stroke.  CT head non contrast was done 08/12/20 and showed no evidence of acute intracranial findings.  She has had intermittent mild to moderate headache which she has noted occurred when her blood pressure is elevated.  These headaches have resolved after taking Tylenol.  Had nontender mild abdominal distention on 08/16/2020.  She was concerned about it, therefore an x-ray abdomen was done at bedside, results came back negative for ileus or obstruction.  No nausea or vomiting, she had positive flatus with no constipation, currently on bowel regimen.  08/17/20: No acute events overnight.  Cardiology signed off on 08/16/2020.  BP this morning 147/68.  We will discharge to home with home health services with RN for BP and medicine checks.  Hospital Course:  Active Problems:   Hypertensive crisis  Resolved chest pain, ruled out ACS Initially presented to her cardiologist with complaints of chest pain, found to be severely hypertensive and was sent to the ED for further evaluation. Troponin S- x2- No evidence of acute ischemia on twelve-lead EKG In the ED, was started on nicardipine drip for hypertensive crisis, this was stopped on 08/12/2020. 2D echo was done on 08/13/2020 revealed normal LVEF. No recurrent  chest pain. Was monitored on telemetry Follow up with your cardiologist in 1 to 2 weeks  Malignant hypertension Presented with severely elevated BP SBP>200 and DBP>100 On 5 blood pressure medications at home including clonidine and HCTZ prior to admission In the ED received nicardipine drip on admission, this was discontinued on  08/12/2020. TSH normal 1.6 She is currently on Coreg 25 mg twice daily, clonidine 0.2 mg twice daily, p.o. Cardizem 240 mg daily, doxazosin 4 mg daily, irbesartan 300 mg daily, spironolactone 50 mg daily, Renal artery Doppler ultrasound completed on 08/13/2020 showed 1 to 59% renal artery stenosis bilaterally. Seen by nephrology, Dr. Moshe Cipro signed off, will follow-up outpatient. Follow-up with nephrology and your PCP in 1 to 2 weeks.  Mild nontender abdominal bulging, possibly related to ventral adipose tissue. She has had previous abdominal surgeries x2, a C-section, reoperated. Nontender with palpation on exam with positive bowel sounds Positive flatus, not constipated while on bowel regimen Abdominal x-ray done on 08/16/2020 due to patient concern for mild bulging, no evidence of ileus or obstruction. Follow-up with your PCP  Right renal cysts/bilateral renal artery stenosis 1 to 59% Renal artery Doppler ultrasound completed on 08/13/2020 Seen by nephrology, will follow up outpatient. Follow-up with nephrology outpatient.  Coronary artery disease status post PCI with stent x2 Personal history of MI Follows with cardiology outpatient Currently denies chest pain Continue antiplatelet, statin, and beta-blocker Follow-up with your cardiologist outpatient in 1 to 2 weeks.  Intermittent headache with prior history of CVA  CT head negative for any acute intracranial findings. States headaches usually occur with her blood pressure is elevated Analgesic as needed Follow-up with your PCP in 1 to 2 weeks.  History of CVA Continue aspirin and statin Mild residual right lower extremity weakness States she ambulates without assistance  History of breast cancer Resume home anastrozole Follow-up with oncology  Chronic anxiety/depression Continue home Zoloft  History of asthma Stable Continue home bronchodilators  Hyperlipidemia Continue home Lipitor 80 mg  daily  CKD 3A Baseline creatinine appears to be 1.0 with GFR 57 Creatinine slightly up trending, continue to closely monitor and avoid nephrotoxins Repeat renal panel in the morning  Resolved post repletion: Hypokalemia Serum potassium 3.4>3.7> 4.1 on 08/17/2020. Magnesium 1.9.  Ambulatory dysfunction Fall precautions.  Constipation Resolved  Obesity BMI 35 Recommend weight loss outpatient with regular physical activity and healthy dieting.    Code Status: Full code   Consultants:  Cardiology, signed off on 08/16/2020  Nephrology, signed off on 08/14/2020.  Procedures: 2D echo Bilateral renal artery Doppler ultrasound  Antimicrobials:  None    Discharge Exam: BP (!) 147/68   Pulse 64   Temp 98.6 F (37 C) (Oral)   Resp 18   Ht 5\' 8"  (1.727 m)   Wt 106.7 kg   SpO2 99%   BMI 35.76 kg/m  . General: 74 y.o. year-old female well developed well nourished in no acute distress.  Alert and oriented x3. . Cardiovascular: Regular rate and rhythm with no rubs or gallops.  No thyromegaly or JVD noted.   Marland Kitchen Respiratory: Clear to auscultation with no wheezes or rales. Good inspiratory effort. . Abdomen: Soft nontender nondistended with normal bowel sounds x4 quadrants. . Musculoskeletal: No lower extremity edema. 2/4 pulses in all 4 extremities. Marland Kitchen Psychiatry: Mood is appropriate for condition and setting  Discharge Instructions You were cared for by a hospitalist during your hospital stay. If you have any questions about your discharge medications or the care you received while  you were in the hospital after you are discharged, you can call the unit and asked to speak with the hospitalist on call if the hospitalist that took care of you is not available. Once you are discharged, your primary care physician will handle any further medical issues. Please note that NO REFILLS for any discharge medications will be authorized once you are discharged, as it is  imperative that you return to your primary care physician (or establish a relationship with a primary care physician if you do not have one) for your aftercare needs so that they can reassess your need for medications and monitor your lab values.   Allergies as of 08/17/2020   No Known Allergies     Medication List    STOP taking these medications   hydrochlorothiazide 25 MG tablet Commonly known as: HYDRODIURIL   valsartan 160 MG tablet Commonly known as: DIOVAN Replaced by: irbesartan 300 MG tablet     TAKE these medications   albuterol 1.25 MG/3ML nebulizer solution Commonly known as: ACCUNEB Take 3 mLs (1.25 mg total) by nebulization every 6 (six) hours as needed for wheezing. What changed: Another medication with the same name was changed. Make sure you understand how and when to take each.   albuterol 108 (90 Base) MCG/ACT inhaler Commonly known as: VENTOLIN HFA Inhale 2 puffs into the lungs every 6 (six) hours as needed. What changed: reasons to take this   anastrozole 1 MG tablet Commonly known as: ARIMIDEX Take 1 tablet (1 mg total) by mouth daily.   aspirin 81 MG EC tablet Take 1 tablet (81 mg total) by mouth daily. Swallow whole.   atorvastatin 80 MG tablet Commonly known as: LIPITOR Take 80 mg by mouth daily.   carvedilol 25 MG tablet Commonly known as: COREG Take 25 mg by mouth 2 (two) times daily.   cloNIDine 0.2 MG tablet Commonly known as: CATAPRES Take 0.2 mg by mouth 2 (two) times daily.   diltiazem 240 MG 24 hr capsule Commonly known as: CARDIZEM CD Take 1 capsule (240 mg total) by mouth daily.   doxazosin 2 MG tablet Commonly known as: CARDURA TAKE 1 TABLET(2 MG) BY MOUTH TWICE DAILY What changed: See the new instructions.   irbesartan 300 MG tablet Commonly known as: AVAPRO Take 1 tablet (300 mg total) by mouth daily. Replaces: valsartan 160 MG tablet   sertraline 25 MG tablet Commonly known as: ZOLOFT Take 1 tablet (25 mg total) by  mouth daily.   spironolactone 50 MG tablet Commonly known as: ALDACTONE Take 1 tablet (50 mg total) by mouth daily.   traZODone 50 MG tablet Commonly known as: DESYREL Take 1 tablet (50 mg total) by mouth at bedtime as needed for sleep.   Trelegy Ellipta 200-62.5-25 MCG/INH Aepb Generic drug: Fluticasone-Umeclidin-Vilant Inhale 1 puff into the lungs daily.   Vitamin D (Ergocalciferol) 1.25 MG (50000 UNIT) Caps capsule Commonly known as: DRISDOL Take 1 capsule (50,000 Units total) by mouth every 7 (seven) days.      No Known Allergies  Follow-up Information    Jessica Nian, DO. Call in 1 day(s).   Specialty: Family Medicine Why: Please call for a post hospital follow-up appointment. Contact information: [place] [address] [phone]        Donato Heinz, MD .   Specialties: Cardiology, Radiology Contact information: [address] [place] [address] [phone]        Corliss Parish, MD. Call in 1  day(s).   Specialty: Nephrology Why: Please call for a post hospital follow-up appointment. Contact information: [place] [address] [place], [place] Follow up.   Why: This is the home health agency that will be providing nursing services Contact information: [place] [address] [phone]                The results of significant diagnostics from this hospitalization (including imaging, microbiology, ancillary and laboratory) are listed below for reference.    Significant Diagnostic Studies: DG Chest 2 View  Result Date: 08/11/2020 CLINICAL DATA:  Chest pain EXAM: CHEST - 2 VIEW COMPARISON:  06/28/2020, CT 07/14/2020 FINDINGS: Mild blunting of left CP angle, probable pleural scarring. Cardiomegaly. No focal consolidation. No pneumothorax IMPRESSION: No active cardiopulmonary disease. Cardiomegaly.  Probable left CP angle scarring. Electronically Signed   By: Donavan Foil M.D.   On: 08/11/2020 17:11   CT HEAD WO CONTRAST  Result Date: 08/12/2020 CLINICAL DATA:  Headache EXAM: CT HEAD WITHOUT CONTRAST TECHNIQUE: Contiguous axial images were obtained from the base of the skull through the vertex without intravenous contrast. COMPARISON:  None. FINDINGS: Brain: There is no acute intracranial hemorrhage, mass effect, or edema. Gray-white differentiation is preserved. There is no extra-axial fluid collection. Prominence of the ventricles and sulci reflects minor generalized parenchymal volume loss. Confluent areas of hypoattenuation in the supratentorial white matter are nonspecific may reflect advanced chronic microvascular ischemic changes. Small chronic infarcts or prominent perivascular spaces at the level of the inferior basal ganglia bilaterally. Vascular: There is atherosclerotic calcification at the skull base. Skull: Calvarium is unremarkable. Sinuses/Orbits: No acute finding. Other: None. IMPRESSION: No acute intracranial abnormality. Advanced chronic microvascular ischemic changes. Electronically Signed   By: Macy Mis M.D.   On: 08/12/2020 10:58   DG Abd Portable 1V  Result Date: 08/16/2020 CLINICAL DATA:  Abdominal distension. EXAM: PORTABLE ABDOMEN - 1 VIEW COMPARISON:  None. FINDINGS: The bowel gas pattern is normal. Phleboliths are noted in the pelvis. IMPRESSION: No evidence of bowel obstruction or ileus. Electronically Signed   By: Marijo Conception M.D.   On: 08/16/2020 13:48   ECHOCARDIOGRAM COMPLETE  Result Date: 08/13/2020    ECHOCARDIOGRAM REPORT   Patient Name:   IVYLYNN HOPPES Date of Exam: 08/13/2020 Medical Rec #:  [id number]       Height:       68.0 in Accession #:    [id number]      Weight:       235.2 lb Date of Birth:  08/29/1946      BSA:          2.190 m Patient Age:    29 years        BP:           157/83 mmHg Patient Gender: F               HR:           62 bpm.  Exam Location:  Inpatient Procedure: 2D Echo, Cardiac Doppler, Color Doppler and Intracardiac            Opacification Agent Indications:    Chest pain  History:        Patient has no prior history of Echocardiogram examinations. CAD                 and Previous Myocardial Infarction, COPD and Stroke,  Signs/Symptoms:Chest Pain; Risk Factors:Hypertension,                 Dyslipidemia and Obesity.  Sonographer:    Dustin Flock Referring Phys: [id number] [place]  Sonographer Comments: Suboptimal parasternal window and patient is morbidly obese. Image acquisition challenging due to patient body habitus and Image acquisition challenging due to COPD. IMPRESSIONS  1. Left ventricular ejection fraction, by estimation, is 60 to 65%. The left ventricle has normal function. The left ventricle has no regional wall motion abnormalities. Left ventricular diastolic function could not be evaluated.  2. Right ventricular systolic function is normal. The right ventricular size is normal. There is normal pulmonary artery systolic pressure. The estimated right ventricular systolic pressure is 00.8 mmHg.  3. The mitral valve is grossly normal. No evidence of mitral valve regurgitation. No evidence of mitral stenosis.  4. The aortic valve is grossly normal. Aortic valve regurgitation is not visualized. No aortic stenosis is present.  5. The inferior vena cava is normal in size with greater than 50% respiratory variability, suggesting right atrial pressure of 3 mmHg. FINDINGS  Left Ventricle: Left ventricular ejection fraction, by estimation, is 60 to 65%. The left ventricle has normal function. The left ventricle has no regional wall motion abnormalities. Definity contrast agent was given IV to delineate the left ventricular  endocardial borders. The left ventricular internal cavity size was normal in size. There is no left ventricular hypertrophy. Left ventricular diastolic function could not be evaluated due to  indeterminate diastolic function. Left ventricular diastolic function could not be evaluated. Right Ventricle: The right ventricular size is normal. No increase in right ventricular wall thickness. Right ventricular systolic function is normal. There is normal pulmonary artery systolic pressure. The tricuspid regurgitant velocity is 2.04 m/s, and  with an assumed right atrial pressure of 3 mmHg, the estimated right ventricular systolic pressure is 67.6 mmHg. Left Atrium: Left atrial size was normal in size. Right Atrium: Right atrial size was normal in size. Pericardium: Trivial pericardial effusion is present. Presence of pericardial fat pad. Mitral Valve: The mitral valve is grossly normal. No evidence of mitral valve regurgitation. No evidence of mitral valve stenosis. Tricuspid Valve: The tricuspid valve is grossly normal. Tricuspid valve regurgitation is trivial. No evidence of tricuspid stenosis. Aortic Valve: The aortic valve is grossly normal. Aortic valve regurgitation is not visualized. No aortic stenosis is present. Pulmonic Valve: The pulmonic valve was not well visualized. Aorta: The aortic root was not well visualized. Venous: The inferior vena cava is normal in size with greater than 50% respiratory variability, suggesting right atrial pressure of 3 mmHg. IAS/Shunts: The atrial septum is grossly normal.   Diastology LV e' medial:    5.33 cm/s LV E/e' medial:  15.7 LV e' lateral:   9.90 cm/s LV E/e' lateral: 8.4  RIGHT VENTRICLE RV Basal diam:  3.60 cm RV S prime:     7.51 cm/s TAPSE (M-mode): 2.8 cm LEFT ATRIUM             Index       RIGHT ATRIUM           Index LA Vol (A2C):   55.6 ml 25.39 ml/m RA Area:     19.50 cm LA Vol (A4C):   52.8 ml 24.11 ml/m RA Volume:   57.00 ml  26.03 ml/m LA Biplane Vol: 54.8 ml 25.03 ml/m  AORTIC VALVE LVOT Vmax:   94.00 cm/s LVOT Vmean:  69.100 cm/s LVOT VTI:  0.204 m MITRAL VALVE               TRICUSPID VALVE MV Area (PHT): 3.60 cm    TR Peak grad:   16.6  mmHg MV Decel Time: 211 msec    TR Vmax:        204.00 cm/s MV E velocity: 83.60 cm/s MV A velocity: 72.80 cm/s  SHUNTS MV E/A ratio:  1.15        Systemic VTI: 0.20 m Eleonore Chiquito MD Electronically signed by Eleonore Chiquito MD Signature Date/Time: 08/13/2020/2:59:31 PM    Final    VAS US RENAL ARTERY DUPLEX  Result Date: 08/13/2020 ABDOMINAL VISCERAL Indications: Hypertensive urgency High Risk Factors: Hypertension. Limitations: Air/bowel gas, obesity and patient movement, respiratory disturbance. Comparison Study: No prior studies. Performing Technologist: Oliver Hum RVT  Examination Guidelines: A complete evaluation includes B-mode imaging, spectral Doppler, color Doppler, and power Doppler as needed of all accessible portions of each vessel. Bilateral testing is considered an integral part of a complete examination. Limited examinations for reoccurring indications may be performed as noted.  Duplex Findings: +--------------------+--------+--------+------+------------------+ Mesenteric          PSV cm/sEDV cm/sPlaque     Comments      +--------------------+--------+--------+------+------------------+ Aorta Mid              86      15                            +--------------------+--------+--------+------+------------------+ Celiac Artery Origin                      Unable to insonate +--------------------+--------+--------+------+------------------+ SMA Origin            272      28                            +--------------------+--------+--------+------+------------------+    +------------------+--------+--------+-------+ Right Renal ArteryPSV cm/sEDV cm/sComment +------------------+--------+--------+-------+ Origin              168      39           +------------------+--------+--------+-------+ Proximal            144      34           +------------------+--------+--------+-------+ Mid                  64      17            +------------------+--------+--------+-------+ Distal               57      14           +------------------+--------+--------+-------+ +-----------------+--------+--------+-------+ Left Renal ArteryPSV cm/sEDV cm/sComment +-----------------+--------+--------+-------+ Origin             132      28           +-----------------+--------+--------+-------+ Proximal            34      5            +-----------------+--------+--------+-------+ Mid                 28      7            +-----------------+--------+--------+-------+ Distal  62      13           +-----------------+--------+--------+-------+  Technologist observations: There are cystic appearing structures noted within the right kidney. The largest structure measures 4.1 cm high by 5.3 cm wide by 5.1 cm long. +------------+--------+--------+----+-----------+--------+--------+----+ Right KidneyPSV cm/sEDV cm/sRI  Left KidneyPSV cm/sEDV cm/sRI   +------------+--------+--------+----+-----------+--------+--------+----+ Upper Pole  32      8       0.74Upper Pole 45      13      0.72 +------------+--------+--------+----+-----------+--------+--------+----+ Mid         43      12      0.73Mid        39      10      0.74 +------------+--------+--------+----+-----------+--------+--------+----+ Lower Pole  31      8       0.73Lower Pole 34      12      0.67 +------------+--------+--------+----+-----------+--------+--------+----+ Hilar       89      18      0.80Hilar      55      11      0.80 +------------+--------+--------+----+-----------+--------+--------+----+ +------------------+-----+------------------+-----+ Right Kidney           Left Kidney             +------------------+-----+------------------+-----+ RAR                    RAR                     +------------------+-----+------------------+-----+ RAR (manual)      1.95 RAR (manual)      1.53   +------------------+-----+------------------+-----+ Cortex                 Cortex                  +------------------+-----+------------------+-----+ Cortex thickness       Corex thickness         +------------------+-----+------------------+-----+ Kidney length (cm)12.00Kidney length (cm)11.00 +------------------+-----+------------------+-----+  Summary: Renal:  Right: Normal size right kidney. Abnormal right Resistive Index. RRV        flow present. 1-59% stenosis of the right renal artery. There        are cystic appearing structures noted within the right        kidney. The lagest structure measures 4.1 cm high by 5.3 cm        wide by 5.1 cm long. Left:  Normal size of left kidney. Abnormal left Resistive Index.        LRV flow present. 1-59% stenosis of the left renal artery. Mesenteric: Areas of limited visceral study include mesenteric arteries.  *See table(s) above for measurements and observations.  Diagnosing physician: Deitra Mayo MD  Electronically signed by Deitra Mayo MD on 08/13/2020 at 1:04:24 PM.    Final     Microbiology: Recent Results (from the past 240 hour(s))  Respiratory Panel by RT PCR (Flu A&B, Covid) - Nasopharyngeal Swab     Status: None   Collection Time: 08/11/20  5:22 PM   Specimen: Nasopharyngeal Swab  Result Value Ref Range Status   SARS Coronavirus 2 by RT PCR NEGATIVE NEGATIVE Final    Comment: (NOTE) SARS-CoV-2 target nucleic acids are NOT DETECTED.  The SARS-CoV-2 RNA is generally detectable in upper respiratoy specimens during the acute phase of infection. The lowest concentration of SARS-CoV-2 viral copies this assay can detect is 131  copies/mL. A negative result does not preclude SARS-Cov-2 infection and should not be used as the sole basis for treatment or other patient management decisions. A negative result may occur with  improper specimen collection/handling, submission of specimen other than nasopharyngeal swab,  presence of viral mutation(s) within the areas targeted by this assay, and inadequate number of viral copies (<131 copies/mL). A negative result must be combined with clinical observations, patient history, and epidemiological information. The expected result is Negative.  Fact Sheet for Patients:  PinkCheek.be  Fact Sheet for Healthcare Providers:  GravelBags.it  This test is no t yet approved or cleared by the [place] FDA and  has been authorized for detection and/or diagnosis of SARS-CoV-2 by FDA under an Emergency Use Authorization (EUA). This EUA will remain  in effect (meaning this test can be used) for the duration of the COVID-19 declaration under Section 564(b)(1) of the Act, 21 U.S.C. section 360bbb-3(b)(1), unless the authorization is terminated or revoked sooner.     Influenza A by PCR NEGATIVE NEGATIVE Final   Influenza B by PCR NEGATIVE NEGATIVE Final    Comment: (NOTE) The Xpert Xpress SARS-CoV-2/FLU/RSV assay is intended as an aid in  the diagnosis of influenza from Nasopharyngeal swab specimens and  should not be used as a sole basis for treatment. Nasal washings and  aspirates are unacceptable for Xpert Xpress SARS-CoV-2/FLU/RSV  testing.  Fact Sheet for Patients: PinkCheek.be  Fact Sheet for Healthcare Providers: [email or web address]  This test is not yet approved or cleared by the [place] FDA and  has been authorized for detection and/or diagnosis of SARS-CoV-2 by  FDA under an Emergency Use Authorization (EUA). This EUA will remain  in effect (meaning this test can be used) for the duration of the  Covid-19 declaration under Section 564(b)(1) of the Act, 21  U.S.C. section 360bbb-3(b)(1), unless the authorization is  terminated or revoked. Performed at [facility], [place] [address], [place], [place] [address]   MRSA  PCR Screening     Status: None   Collection Time: 08/12/20 12:54 PM   Specimen: Nasopharyngeal  Result Value Ref Range Status   MRSA by PCR NEGATIVE NEGATIVE Final    Comment:        The GeneXpert MRSA Assay (FDA approved for NASAL specimens only), is one component of a comprehensive MRSA colonization surveillance program. It is not intended to diagnose MRSA infection nor to guide or monitor treatment for MRSA infections. Performed at [facility], [place] [address] [place], [place] [address]      Labs: Basic Metabolic Panel: Recent Labs  Lab 08/12/20 0505 08/13/20 1023 08/14/20 1016 08/15/20 0817 08/16/20 0245 08/17/20 0333  NA 143 142  --  143 142 143  K 3.4* 3.7  --  3.6 3.7 4.1  CL 107 102  --  105 104 104  CO2 23 28  --  27 31 30   GLUCOSE 134* 103*  --  118* 107* 106*  BUN 19 19  --  20 22 18   CREATININE 1.04* 1.02*  --  0.98 1.11* 1.11*  CALCIUM 9.8 9.9  --  9.9 9.5 9.7  MG 1.9  --  1.9  --   --   --   PHOS  --   --   --  3.0 3.3 3.9   Liver Function Tests: Recent Labs  Lab 08/15/20 0817 08/16/20 0245 08/17/20 0333  ALBUMIN 3.9 3.4* 3.7   No results for input(s): LIPASE, AMYLASE in  the last 168 hours. No results for input(s): AMMONIA in the last 168 hours. CBC: Recent Labs  Lab 08/11/20 1648  WBC 9.7  HGB 15.5*  HCT 47.8*  MCV 88.8  PLT 201   Cardiac Enzymes: No results for input(s): CKTOTAL, CKMB, CKMBINDEX, TROPONINI in the last 168 hours. BNP: BNP (last 3 results) Recent Labs    08/11/20 1725  BNP 65.3    ProBNP (last 3 results) No results for input(s): PROBNP in the last 8760 hours.  CBG: No results for input(s): GLUCAP in the last 168 hours.     Signed:  Kayleen Memos, MD [facility] 08/17/2020, 9:07 AM

## 2020-08-17 NOTE — Plan of Care (Signed)

## 2020-08-17 NOTE — Discharge Instructions (Signed)
Nonspecific Chest Pain Chest pain can be caused by many different conditions. Some causes of chest pain can be life-threatening. These will require treatment right away. Serious causes of chest pain include:  Heart attack.  A tear in the body's main blood vessel.  Redness and swelling (inflammation) around your heart.  Blood clot in your lungs. Other causes of chest pain may not be so serious. These include:  Heartburn.  Anxiety or stress.  Damage to bones or muscles in your chest.  Lung infections. Chest pain can feel like:  Pain or discomfort in your chest.  Crushing, pressure, aching, or squeezing pain.  Burning or tingling.  Dull or sharp pain that is worse when you move, cough, or take a deep breath.  Pain or discomfort that is also felt in your back, neck, jaw, shoulder, or arm, or pain that spreads to any of these areas. It is hard to know whether your pain is caused by something that is serious or something that is not so serious. So it is important to see your doctor right away if you have chest pain. Follow these instructions at home: Medicines  Take over-the-counter and prescription medicines only as told by your doctor.  If you were prescribed an antibiotic medicine, take it as told by your doctor. Do not stop taking the antibiotic even if you start to feel better. Lifestyle   Rest as told by your doctor.  Do not use any products that contain nicotine or tobacco, such as cigarettes, e-cigarettes, and chewing tobacco. If you need help quitting, ask your doctor.  Do not drink alcohol.  Make lifestyle changes as told by your doctor. These may include: ? Getting regular exercise. Ask your doctor what activities are safe for you. ? Eating a heart-healthy diet. A diet and nutrition specialist (dietitian) can help you to learn healthy eating options. ? Staying at a healthy weight. ? Treating diabetes or high blood pressure, if needed. ? Lowering your stress.  Activities such as yoga and relaxation techniques can help. General instructions  Pay attention to any changes in your symptoms. Tell your doctor about them or any new symptoms.  Avoid any activities that cause chest pain.  Keep all follow-up visits as told by your doctor. This is important. You may need more testing if your chest pain does not go away. Contact a doctor if:  Your chest pain does not go away.  You feel depressed.  You have a fever. Get help right away if:  Your chest pain is worse.  You have a cough that gets worse, or you cough up blood.  You have very bad (severe) pain in your belly (abdomen).  You pass out (faint).  You have either of these for no clear reason: ? Sudden chest discomfort. ? Sudden discomfort in your arms, back, neck, or jaw.  You have shortness of breath at any time.  You suddenly start to sweat, or your skin gets clammy.  You feel sick to your stomach (nauseous).  You throw up (vomit).  You suddenly feel lightheaded or dizzy.  You feel very weak or tired.  Your heart starts to beat fast, or it feels like it is skipping beats. These symptoms may be an emergency. Do not wait to see if the symptoms will go away. Get medical help right away. Call your local emergency services (911 in the U[place] Do not drive yourself to the hospital. Summary  Chest pain can be caused by many different conditions. The   cause may be serious and need treatment right away. If you have chest pain, see your doctor right away.  Follow your doctor's instructions for taking medicines and making lifestyle changes.  Keep all follow-up visits as told by your doctor. This includes visits for any further testing if your chest pain does not go away.  Be sure to know the signs that show that your condition has become worse. Get help right away if you have these symptoms. This information is not intended to replace advice given to you by your health care provider. Make  sure you discuss any questions you have with your health care provider. Document Revised: 04/04/2018 Document Reviewed: 04/04/2018 Elsevier Patient Education  2020 [employer]. Hypertension, Adult Hypertension is another name for high blood pressure. High blood pressure forces your heart to work harder to pump blood. This can cause problems over time. There are two numbers in a blood pressure reading. There is a top number (systolic) over a bottom number (diastolic). It is best to have a blood pressure that is below 120/80. Healthy choices can help lower your blood pressure, or you may need medicine to help lower it. What are the causes? The cause of this condition is not known. Some conditions may be related to high blood pressure. What increases the risk?  Smoking.  Having type 2 diabetes mellitus, high cholesterol, or both.  Not getting enough exercise or physical activity.  Being overweight.  Having too much fat, sugar, calories, or salt (sodium) in your diet.  Drinking too much alcohol.  Having long-term (chronic) kidney disease.  Having a family history of high blood pressure.  Age. Risk increases with age.  Race. You may be at higher risk if you are African American.  Gender. Men are at higher risk than women before age 53. After age 71, women are at higher risk than men.  Having obstructive sleep apnea.  Stress. What are the signs or symptoms?  High blood pressure may not cause symptoms. Very high blood pressure (hypertensive crisis) may cause: ? Headache. ? Feelings of worry or nervousness (anxiety). ? Shortness of breath. ? Nosebleed. ? A feeling of being sick to your stomach (nausea). ? Throwing up (vomiting). ? Changes in how you see. ? Very bad chest pain. ? Seizures. How is this treated?  This condition is treated by making healthy lifestyle changes, such as: ? Eating healthy foods. ? Exercising more. ? Drinking less alcohol.  Your health care  provider may prescribe medicine if lifestyle changes are not enough to get your blood pressure under control, and if: ? Your top number is above 130. ? Your bottom number is above 80.  Your personal target blood pressure may vary. Follow these instructions at home: Eating and drinking   If told, follow the DASH eating plan. To follow this plan: ? Fill one half of your plate at each meal with fruits and vegetables. ? Fill one fourth of your plate at each meal with whole grains. Whole grains include whole-wheat pasta, brown rice, and whole-grain bread. ? Eat or drink low-fat dairy products, such as skim milk or low-fat yogurt. ? Fill one fourth of your plate at each meal with low-fat (lean) proteins. Low-fat proteins include fish, chicken without skin, eggs, beans, and tofu. ? Avoid fatty meat, cured and processed meat, or chicken with skin. ? Avoid pre-made or processed food.  Eat less than 1,500 mg of salt each day.  Do not drink alcohol if: ? Your doctor  tells you not to drink. ? You are pregnant, may be pregnant, or are planning to become pregnant.  If you drink alcohol: ? Limit how much you use to:  0-1 drink a day for women.  0-2 drinks a day for men. ? Be aware of how much alcohol is in your drink. In the [place] one drink equals one 12 oz bottle of beer (355 mL), one 5 oz glass of wine (148 mL), or one 1 oz glass of hard liquor (44 mL). Lifestyle   Work with your doctor to stay at a healthy weight or to lose weight. Ask your doctor what the best weight is for you.  Get at least 30 minutes of exercise most days of the week. This may include walking, swimming, or biking.  Get at least 30 minutes of exercise that strengthens your muscles (resistance exercise) at least 3 days a week. This may include lifting weights or doing Pilates.  Do not use any products that contain nicotine or tobacco, such as cigarettes, e-cigarettes, and chewing tobacco. If you need help quitting, ask  your doctor.  Check your blood pressure at home as told by your doctor.  Keep all follow-up visits as told by your doctor. This is important. Medicines  Take over-the-counter and prescription medicines only as told by your doctor. Follow directions carefully.  Do not skip doses of blood pressure medicine. The medicine does not work as well if you skip doses. Skipping doses also puts you at risk for problems.  Ask your doctor about side effects or reactions to medicines that you should watch for. Contact a doctor if you:  Think you are having a reaction to the medicine you are taking.  Have headaches that keep coming back (recurring).  Feel dizzy.  Have swelling in your ankles.  Have trouble with your vision. Get help right away if you:  Get a very bad headache.  Start to feel mixed up (confused).  Feel weak or numb.  Feel faint.  Have very bad pain in your: ? Chest. ? Belly (abdomen).  Throw up more than once.  Have trouble breathing. Summary  Hypertension is another name for high blood pressure.  High blood pressure forces your heart to work harder to pump blood.  For most people, a normal blood pressure is less than 120/80.  Making healthy choices can help lower blood pressure. If your blood pressure does not get lower with healthy choices, you may need to take medicine. This information is not intended to replace advice given to you by your health care provider. Make sure you discuss any questions you have with your health care provider. Document Revised: 06/12/2018 Document Reviewed: 06/12/2018 Elsevier Patient Education  2020 [employer].

## 2020-08-18 ENCOUNTER — Other Ambulatory Visit: Payer: Self-pay | Admitting: Student

## 2020-08-18 ENCOUNTER — Telehealth: Payer: Self-pay

## 2020-08-18 DIAGNOSIS — I493 Ventricular premature depolarization: Secondary | ICD-10-CM

## 2020-08-18 NOTE — Telephone Encounter (Signed)
Transition Care Management Follow-up Telephone Call  Date of discharge and from where: 08/17/20-[facility]  How have you been since you were released from the hospital? Good  Any questions or concerns? No Per Caregiver-Daron Items Reviewed:  Did the pt receive and understand the discharge instructions provided? Yes   Medications obtained and verified? Yes   Other? Yes   Any new allergies since your discharge? No   Dietary orders reviewed? Yes  Do you have support at home? Yes   Home Care and Equipment/Supplies: Were home health services ordered? yes If so, what is the name of the agency? [place] set up a time to come to the patient's home? no Were any new equipment or medical supplies ordered?  No What is the name of the medical supply agency? n/a Were you able to get the supplies/equipment? not applicable Do you have any questions related to the use of the equipment or supplies? No  Functional Questionnaire: (I = Independent and D = Dependent) ADLs: I  Bathing/Dressing- I  Meal Prep- I-with assistance  Eating- I  Maintaining continence- I  Transferring/Ambulation- I  Managing Meds- I  Follow up appointments reviewed:   PCP Hospital f/u appt confirmed? Yes  Scheduled to see Dr. Bryan Lemma on 08/20/20 @ 10:30am.  [facility] f/u appt confirmed? Yes  Scheduled to see Dr. Gardiner Rhyme on 08/24/20 @ 1:40pm.  Are transportation arrangements needed? No   If their condition worsens, is the pt aware to call PCP or go to the Emergency Dept.? Yes  Was the patient provided with contact information for the PCP's office or ED? Yes  Was to pt encouraged to call back with questions or concerns? Yes

## 2020-08-18 NOTE — TOC Transition Note (Addendum)
Transition of Care [facility]) - CM/SW Discharge Note   Patient Details  Name: Jessica [name] MRN: [id number] Date of Birth: [birth year]  [facility] CM/SW Contact:  Leeroy Cha, RN Phone Number: 08/18/2020, 10:33 AM   Clinical Narrative:    [phone] [facility] [facility] unable to take patient due to staffing. Text sent to [facility] with [facility] to see if they can do services requested. 'accepted by [facility].       Barriers to Discharge: Barriers Unresolved (comment) (iv cardene hypertensive crisis)   Patient Goals and CMS Choice Patient states their goals for this hospitalization and ongoing recovery are:: to go home CMS Medicare.gov Compare Post Acute Care list provided to:: Patient    Discharge Placement                       Discharge Plan and Services   Discharge Planning Services: CM Consult                                 Social Determinants of Health (SDOH) Interventions     Readmission Risk Interventions No flowsheet data found.

## 2020-08-18 NOTE — Progress Notes (Signed)
Ordered 2 week Zio monitor for further evaluation of PVCs as well as possible Wenckebach per Dr. Johnsie Cancel.  Darreld Mclean, PA-C 08/18/2020 2:09 PM

## 2020-08-19 ENCOUNTER — Encounter: Payer: Self-pay | Admitting: *Deleted

## 2020-08-19 ENCOUNTER — Other Ambulatory Visit: Payer: Self-pay

## 2020-08-19 LAB — ALDOSTERONE + RENIN ACTIVITY W/ RATIO
ALDO / PRA Ratio: 43.2 — ABNORMAL HIGH (ref 0.0–30.0)
Aldosterone: 10.2 ng/dL (ref 0.0–30.0)
PRA LC/MS/MS: 0.236 ng/mL/hr (ref 0.167–5.380)

## 2020-08-19 NOTE — Progress Notes (Signed)
Patient ID: Jessica [name], female   DOB: 12-20-1945, 74 y.o.   MRN: [id number] Patient enrolled for [employer] to ship a 14 day ZIO XT long term holter monitor to her home.  Letter with instructions mailed to patient.

## 2020-08-20 ENCOUNTER — Encounter: Payer: Self-pay | Admitting: Family Medicine

## 2020-08-20 ENCOUNTER — Ambulatory Visit (INDEPENDENT_AMBULATORY_CARE_PROVIDER_SITE_OTHER): Payer: Medicare Other | Admitting: Family Medicine

## 2020-08-20 VITALS — BP 122/74 | HR 84 | Temp 97.4°F | Ht 68.0 in | Wt 234.2 lb

## 2020-08-20 DIAGNOSIS — I10 Essential (primary) hypertension: Secondary | ICD-10-CM | POA: Diagnosis not present

## 2020-08-20 LAB — METANEPHRINES, URINE, 24 HOUR
Metaneph Total, Ur: 96 ug/L
Metanephrines, 24H Ur: 130 ug/24 hr (ref 36–209)
Normetanephrine, 24H Ur: 729 ug/24 hr — ABNORMAL HIGH (ref 131–612)
Normetanephrine, Ur: 540 ug/L
Total Volume: 1350

## 2020-08-20 MED ORDER — DILTIAZEM HCL ER COATED BEADS 240 MG PO CP24: 240.0000 mg | ORAL_CAPSULE | Freq: Every day | ORAL | 3 refills | Status: DC

## 2020-08-20 MED ORDER — CLONIDINE HCL 0.2 MG PO TABS: 0.2000 mg | ORAL_TABLET | Freq: Two times a day (BID) | ORAL | 3 refills | Status: DC

## 2020-08-20 MED ORDER — CARVEDILOL 25 MG PO TABS: 25.0000 mg | ORAL_TABLET | Freq: Two times a day (BID) | ORAL | 3 refills | Status: DC

## 2020-08-20 NOTE — Progress Notes (Signed)
Chief Complaint  Patient presents with  . Hospitalization Follow-up    f/u from 10/27 hospital for elevated BP, average BP 170/90 - 138/78.  refills on Carvedilol & Clonidine.    HPI: Jessica [name] is a 74 y.o. female here for HTN follow-up. Her home BP log shows 133-170/78-90. She needs refills of her coreg and clonidine.  Appt with cardio on 08/24/20. Has to schedule appt with [facility] (Dr. Moshe Cipro) and states she will do so today.  Pt denies CP, SOB, palpitations, n/v, HA, dizziness, vision changes.  BP Readings from Last 3 Encounters:  08/20/20 122/74  08/17/20 (!) 147/68  08/11/20 (!) 208/100   Lab Results  Component Value Date   CREATININE 1.11 (H) 08/17/2020   BUN 18 08/17/2020   NA 143 08/17/2020   K 4.1 08/17/2020   CL 104 08/17/2020   CO2 30 08/17/2020    Past Medical History:  Diagnosis Date  . Asthma   . Cancer ([place])   . CHF (congestive heart failure) ([place])   . Chronic kidney disease   . COPD (chronic obstructive pulmonary disease) ([place])   . Hyperlipidemia   . Hypertension   . Stroke [facility])     Past Surgical History:  Procedure Laterality Date  . stomach surgery  1990    Social History   Socioeconomic History  . Marital status: Widowed    Spouse name: Not on file  . Number of children: Not on file  . Years of education: Not on file  . Highest education level: Not on file  Occupational History  . Not on file  Tobacco Use  . Smoking status: Former Smoker    Types: Cigarettes    Quit date: 2020    Years since quitting: 1.8  . Smokeless tobacco: Never Used  Vaping Use  . Vaping Use: Never used  Substance and Sexual Activity  . Alcohol use: Never  . Drug use: Never  . Sexual activity: Not Currently  Other Topics Concern  . Not on file  Social History Narrative  . Not on file   Social Determinants of Health   Financial Resource Strain:   . Difficulty of Paying Living Expenses: Not on file  Food Insecurity:   . Worried  About Charity fundraiser in the Last Year: Not on file  . Ran Out of Food in the Last Year: Not on file  Transportation Needs:   . Lack of Transportation (Medical): Not on file  . Lack of Transportation (Non-Medical): Not on file  Physical Activity:   . Days of Exercise per Week: Not on file  . Minutes of Exercise per Session: Not on file  Stress:   . Feeling of Stress : Not on file  Social Connections:   . Frequency of Communication with Friends and Family: Not on file  . Frequency of Social Gatherings with Friends and Family: Not on file  . Attends Religious Services: Not on file  . Active Member of Clubs or Organizations: Not on file  . Attends Archivist Meetings: Not on file  . Marital Status: Not on file  Intimate Partner Violence:   . Fear of Current or Ex-Partner: Not on file  . Emotionally Abused: Not on file  . Physically Abused: Not on file  . Sexually Abused: Not on file    Family History  Problem Relation Age of Onset  . Stroke Mother   . Heart disease Mother   . Hyperlipidemia Mother   .  Heart disease Maternal Aunt   . Hyperlipidemia Maternal Aunt   . Stroke Maternal Aunt      Immunization History  Administered Date(s) Administered  . Fluad Quad(high Dose 65+) 07/21/2020  . PFIZER SARS-COV-2 Vaccination 12/12/2019, 12/23/2019    Outpatient Encounter Medications as of 08/20/2020  Medication Sig Note  . albuterol (ACCUNEB) 1.25 MG/3ML nebulizer solution Take 3 mLs (1.25 mg total) by nebulization every 6 (six) hours as needed for wheezing.   Marland Kitchen albuterol (VENTOLIN HFA) 108 (90 Base) MCG/ACT inhaler Inhale 2 puffs into the lungs every 6 (six) hours as needed. (Patient taking differently: Inhale 2 puffs into the lungs every 6 (six) hours as needed for wheezing. )   . anastrozole (ARIMIDEX) 1 MG tablet Take 1 tablet (1 mg total) by mouth daily.   Marland Kitchen aspirin EC 81 MG EC tablet Take 1 tablet (81 mg total) by mouth daily. Swallow whole.   Marland Kitchen atorvastatin  (LIPITOR) 80 MG tablet Take 80 mg by mouth daily.   . carvedilol (COREG) 25 MG tablet Take 25 mg by mouth 2 (two) times daily.   . cloNIDine (CATAPRES) 0.2 MG tablet Take 1 tablet (0.2 mg total) by mouth 2 (two) times daily.   Marland Kitchen diltiazem (CARDIZEM CD) 240 MG 24 hr capsule Take 1 capsule (240 mg total) by mouth daily.   Marland Kitchen doxazosin (CARDURA) 2 MG tablet TAKE 1 TABLET(2 MG) BY MOUTH TWICE DAILY (Patient taking differently: Take 2 mg by mouth 2 (two) times daily. )   . Fluticasone-Umeclidin-Vilant (TRELEGY ELLIPTA) 200-62.5-25 MCG/INH AEPB Inhale 1 puff into the lungs daily.   . irbesartan (AVAPRO) 300 MG tablet Take 1 tablet (300 mg total) by mouth daily.   . sertraline (ZOLOFT) 25 MG tablet Take 1 tablet (25 mg total) by mouth daily.   Marland Kitchen spironolactone (ALDACTONE) 50 MG tablet Take 1 tablet (50 mg total) by mouth daily.   . traZODone (DESYREL) 50 MG tablet Take 1 tablet (50 mg total) by mouth at bedtime as needed for sleep.   . Vitamin D, Ergocalciferol, (DRISDOL) 1.25 MG (50000 UNIT) CAPS capsule Take 1 capsule (50,000 Units total) by mouth every 7 (seven) days. 08/11/2020: Tuesday   . [DISCONTINUED] cloNIDine (CATAPRES) 0.2 MG tablet Take 0.2 mg by mouth 2 (two) times daily.   . [DISCONTINUED] diltiazem (CARDIZEM CD) 240 MG 24 hr capsule Take 1 capsule (240 mg total) by mouth daily.    Facility-Administered Encounter Medications as of 08/20/2020  Medication  . nitroGLYCERIN (NITROSTAT) SL tablet 0.4 mg     ROS: Pertinent positives and negatives noted in HPI. Remainder of ROS non-contributory   No Known Allergies  BP 122/74   Pulse 84   Temp (!) 97.4 F (36.3 C) (Temporal)   Ht 5\' 8"  (1.727 m)   Wt 234 lb 3.2 oz (106.2 kg)   SpO2 93%   BMI 35.61 kg/m   Physical Exam Constitutional:      General: She is not in acute distress.    Appearance: She is obese. She is not ill-appearing.  Cardiovascular:     Rate and Rhythm: Normal rate and regular rhythm.     Pulses: Normal pulses.    Pulmonary:     Effort: No respiratory distress.     Breath sounds: Normal breath sounds. No stridor. No wheezing or rhonchi.  Musculoskeletal:     Right lower leg: No edema.     Left lower leg: No edema.  Neurological:     Mental Status: She is alert  and oriented to person, place, and time.  Psychiatric:        Mood and Affect: Mood normal.        Behavior: Behavior normal.      A/P:  1. Primary hypertension - controlled, at goal today - home Bps 133-170/78-90 - f/u with cardio as scheduled on Tuesday 08/24/20 - pt to call renal to schedule appt, she has phone number Refill: - diltiazem (CARDIZEM CD) 240 MG 24 hr capsule; Take 1 capsule (240 mg total) by mouth daily.  Dispense: 90 capsule; Refill: 3 - cloNIDine (CATAPRES) 0.2 MG tablet; Take 1 tablet (0.2 mg total) by mouth 2 (two) times daily.  Dispense: 180 tablet; Refill: 3 - f/u in 3-4 mo or sooner PRN  This visit occurred during the SARS-CoV-2 public health emergency.  Safety protocols were in place, including screening questions prior to the visit, additional usage of staff PPE, and extensive cleaning of exam room while observing appropriate contact time as indicated for disinfecting solutions.

## 2020-08-20 NOTE — Addendum Note (Signed)
Addended by: Ronnald Nian on: 08/20/2020 11:29 AM   Modules accepted: Orders

## 2020-08-22 NOTE — Progress Notes (Signed)
Cardiology Office Note:    Date:  08/25/2020   ID:  Jessica [name], DOB [birth year], MRN [id number]  PCP:  Ronnald Nian, DO  Cardiologist:  Donato Heinz, MD  Electrophysiologist:  None   Referring MD: Ronnald Nian, DO   No chief complaint on file.  History of Present Illness:    Jessica [name] is a 74 y.o. female with a hx of CVA, COPD, hypertension, hyperlipidemia, CKD, breast cancer, CAD status post PCI who presents for follow-up.  She was initially seen on 08/11/2020.  She moved to [place] from [place] in July 2021.  Reports a history of CAD status post MI in stent x2.  Reports first MI was in 2010, had stent at [facility].  Second stent was DES to LAD in 2017.  At initial clinic visit on 08/11/2020.  She reported having chest pain and BP was markedly elevated.  It was recommended that she go to the ED and she was admitted to [facility] from 10/27 through 08/17/2020.  Troponins were negative.  She initially required nicardipine drip for hypertension.  Work-up of secondary hypertension included renal artery duplex showing bilateral renal artery stenosis 1 to 59%, normal TSH, elevated aldosterone/renin ratio (43).  Echocardiogram on 08/13/2020 showed normal biventricular function, no significant valvular disease.  Since discharge from hospital, she reports that she is doing well.  Denies any further chest pain or dyspnea.  Denies any lightheadedness, syncope, or palpitations.  Does report she has been told she had OSA after sleep study and 2012 but never started on CPAP.    Past Medical History:  Diagnosis Date  . Asthma   . Cancer ([place])   . CHF (congestive heart failure) ([place])   . Chronic kidney disease   . COPD (chronic obstructive pulmonary disease) ([place])   . Hyperlipidemia   . Hypertension   . Stroke [facility])     Past Surgical History:  Procedure Laterality Date  . stomach surgery  1990    Current Medications: Current Meds  Medication Sig  .  albuterol (ACCUNEB) 1.25 MG/3ML nebulizer solution Take 3 mLs (1.25 mg total) by nebulization every 6 (six) hours as needed for wheezing.  Marland Kitchen albuterol (VENTOLIN HFA) 108 (90 Base) MCG/ACT inhaler Inhale 2 puffs into the lungs every 6 (six) hours as needed. (Patient taking differently: Inhale 2 puffs into the lungs every 6 (six) hours as needed for wheezing. )  . anastrozole (ARIMIDEX) 1 MG tablet Take 1 tablet (1 mg total) by mouth daily.  Marland Kitchen aspirin EC 81 MG EC tablet Take 1 tablet (81 mg total) by mouth daily. Swallow whole.  Marland Kitchen atorvastatin (LIPITOR) 80 MG tablet Take 80 mg by mouth daily.  . carvedilol (COREG) 25 MG tablet Take 1 tablet (25 mg total) by mouth 2 (two) times daily.  . cloNIDine (CATAPRES) 0.2 MG tablet Take 1 tablet (0.2 mg total) by mouth 2 (two) times daily.  Marland Kitchen doxazosin (CARDURA) 2 MG tablet TAKE 1 TABLET(2 MG) BY MOUTH TWICE DAILY (Patient taking differently: Take 2 mg by mouth 2 (two) times daily. )  . Fluticasone-Umeclidin-Vilant (TRELEGY ELLIPTA) 200-62.5-25 MCG/INH AEPB Inhale 1 puff into the lungs daily.  . sertraline (ZOLOFT) 25 MG tablet Take 1 tablet (25 mg total) by mouth daily.  . traZODone (DESYREL) 50 MG tablet Take 1 tablet (50 mg total) by mouth at bedtime as needed for sleep.  . Vitamin D, Ergocalciferol, (DRISDOL) 1.25 MG (50000 UNIT) CAPS capsule Take 1 capsule (50,000 Units total) by mouth every  7 (seven) days.  . [DISCONTINUED] irbesartan (AVAPRO) 300 MG tablet Take 1 tablet (300 mg total) by mouth daily.  . [DISCONTINUED] spironolactone (ALDACTONE) 50 MG tablet Take 1 tablet (50 mg total) by mouth daily.   Current Facility-Administered Medications for the 08/24/20 encounter (Office Visit) with Donato Heinz, MD  Medication  . nitroGLYCERIN (NITROSTAT) SL tablet 0.4 mg     Allergies:   Patient has no known allergies.   Social History   Socioeconomic History  . Marital status: Widowed    Spouse name: Not on file  . Number of children: Not on  file  . Years of education: Not on file  . Highest education level: Not on file  Occupational History  . Not on file  Tobacco Use  . Smoking status: Former Smoker    Types: Cigarettes    Quit date: 2020    Years since quitting: 1.8  . Smokeless tobacco: Never Used  Vaping Use  . Vaping Use: Never used  Substance and Sexual Activity  . Alcohol use: Never  . Drug use: Never  . Sexual activity: Not Currently  Other Topics Concern  . Not on file  Social History Narrative  . Not on file   Social Determinants of Health   Financial Resource Strain:   . Difficulty of Paying Living Expenses: Not on file  Food Insecurity:   . Worried About Charity fundraiser in the Last Year: Not on file  . Ran Out of Food in the Last Year: Not on file  Transportation Needs:   . Lack of Transportation (Medical): Not on file  . Lack of Transportation (Non-Medical): Not on file  Physical Activity:   . Days of Exercise per Week: Not on file  . Minutes of Exercise per Session: Not on file  Stress:   . Feeling of Stress : Not on file  Social Connections:   . Frequency of Communication with Friends and Family: Not on file  . Frequency of Social Gatherings with Friends and Family: Not on file  . Attends Religious Services: Not on file  . Active Member of Clubs or Organizations: Not on file  . Attends Archivist Meetings: Not on file  . Marital Status: Not on file     Family History: The patient's family history includes Heart disease in her maternal aunt and mother; Hyperlipidemia in her maternal aunt and mother; Stroke in her maternal aunt and mother.  ROS:   Please see the history of present illness.     All other systems reviewed and are negative.  EKGs/Labs/Other Studies Reviewed:    The following studies were reviewed today:   EKG:  EKG is  ordered today.  The ekg ordered today demonstrates normal sinus rhythm, Q waves in V1-4, nonspecific T wave flattening, no ST  changes.  Recent Labs: 07/28/2020: ALT 21 08/11/2020: B Natriuretic Peptide 65.3; Hemoglobin 15.5; Platelets 201 08/12/2020: TSH 1.693 08/14/2020: Magnesium 1.9 08/24/2020: BUN 36; Creatinine, Ser 1.95; Potassium 4.6; Sodium 141  Recent Lipid Panel    Component Value Date/Time   CHOL 128 08/12/2020 0505   TRIG 95 08/12/2020 0505   HDL 45 08/12/2020 0505   CHOLHDL 2.8 08/12/2020 0505   VLDL 19 08/12/2020 0505   LDLCALC 64 08/12/2020 0505    Physical Exam:    VS:  BP 124/76   Pulse (!) 102   Ht 5\' 8"  (1.727 m)   Wt 232 lb (105.2 kg)   SpO2 92%   BMI  35.28 kg/m     Wt Readings from Last 3 Encounters:  08/24/20 232 lb (105.2 kg)  08/20/20 234 lb 3.2 oz (106.2 kg)  08/11/20 235 lb 3.2 oz (106.7 kg)     GEN:  in no acute distress HEENT: Normal NECK: No JVD; No carotid bruits LYMPHATICS: No lymphadenopathy CARDIAC: RRR, no murmurs, rubs, gallops RESPIRATORY:  Clear to auscultation without rales, wheezing or rhonchi  ABDOMEN: Soft, non-tender, non-distended MUSCULOSKELETAL:  No edema; No deformity  SKIN: Warm and dry NEUROLOGIC:  Alert and oriented x 3 PSYCHIATRIC:  Normal affect   ASSESSMENT:    1. Essential hypertension   2. CAD in native artery   3. OSA (obstructive sleep apnea)   4. Cerebrovascular accident (CVA), unspecified mechanism ([place])   5. Hyperlipidemia, unspecified hyperlipidemia type   6. Stage 3 chronic kidney disease, unspecified whether stage 3a or 3b CKD (HCC)    PLAN:     CAD: Status post MI and stent x2, last in 2017.   -Continue aspirin 81 mg daily, atorvastatin 80 mg daily  Hypertension: On carvedilol 25 mg twice daily, clonidine 0.2 mg twice daily, Cardizem 240 mg daily, doxazosin 4 mg daily, irbesartan 300 mg daily, spironolactone 50 mg daily -Appears controlled, continue current medications -Suspect untreated OSA contributing, will check sleep study -Will check a BMP  CVA: on aspirin, statin  Hyperlipidemia: LDL 64 on 08/12/2020.   On atorvastatin 80 mg daily  CKD stage III: Creatinine 1.3 on 07/28/2020.  Follows with nephrology  OSA: Diagnosed in 2012, reports was never started on CPAP.  Will check sleep study  RTC in 3 months  Medication Adjustments/Labs and Tests Ordered: Current medicines are reviewed at length with the patient today.  Concerns regarding medicines are outlined above.  Orders Placed This Encounter  Procedures  . Basic metabolic panel  . Split night study   No orders of the defined types were placed in this encounter.   Patient Instructions  Medication Instructions:  Your physician recommends that you continue on your current medications as directed. Please refer to the Current Medication list given to you today.  *If you need a refill on your cardiac medications before your next appointment, please call your pharmacy*   Lab Work: BMET today  If you have labs (blood work) drawn today and your tests are completely normal, you will receive your results only by: Marland Kitchen MyChart Message (if you have MyChart) OR . A paper copy in the mail If you have any lab test that is abnormal or we need to change your treatment, we will call you to review the results.   Testing/Procedures: Your physician has recommended that you have a sleep study. This test records several body functions during sleep, including: brain activity, eye movement, oxygen and carbon dioxide blood levels, heart rate and rhythm, breathing rate and rhythm, the flow of air through your mouth and nose, snoring, body muscle movements, and chest and belly movement.  Follow-Up: At [facility], you and your health needs are our priority.  As part of our continuing mission to provide you with exceptional heart care, we have created designated Provider Care Teams.  These Care Teams include your primary Cardiologist (physician) and Advanced Practice Providers (APPs -  Physician Assistants and Nurse Practitioners) who all work together to  provide you with the care you need, when you need it.  We recommend signing up for the patient portal called "MyChart".  Sign up information is provided on this After Visit Summary.  MyChart is used to connect with patients for Virtual Visits (Telemedicine).  Patients are able to view lab/test results, encounter notes, upcoming appointments, etc.  Non-urgent messages can be sent to your provider as well.   To learn more about what you can do with MyChart, go to [email or web address].    Your next appointment:   3 month(s)  The format for your next appointment:   In Person  Provider:   You will see one of the following Advanced Practice Providers on your designated Care Team:    Rosaria Ferries, PA-C  Jory Sims, DNP, ANP  Then, Donato Heinz, MD will plan to see you again in 6 month(s).      Signed, Donato Heinz, MD  08/25/2020 11:33 AM    [place]

## 2020-08-23 LAB — 5 HIAA, QUANTITATIVE, URINE, 24 HOUR
5-HIAA, Ur: 3.6 mg/L
5-HIAA,Quant.,24 Hr Urine: 4.9 mg/24 hr (ref 0.0–14.9)
Total Volume: 1350

## 2020-08-24 ENCOUNTER — Other Ambulatory Visit: Payer: Self-pay

## 2020-08-24 ENCOUNTER — Ambulatory Visit: Payer: Medicare Other | Admitting: Cardiology

## 2020-08-24 ENCOUNTER — Other Ambulatory Visit: Payer: Self-pay | Admitting: *Deleted

## 2020-08-24 ENCOUNTER — Encounter: Payer: Self-pay | Admitting: Cardiology

## 2020-08-24 VITALS — BP 124/76 | HR 102 | Ht 68.0 in | Wt 232.0 lb

## 2020-08-24 DIAGNOSIS — G4733 Obstructive sleep apnea (adult) (pediatric): Secondary | ICD-10-CM

## 2020-08-24 DIAGNOSIS — I639 Cerebral infarction, unspecified: Secondary | ICD-10-CM

## 2020-08-24 DIAGNOSIS — I10 Essential (primary) hypertension: Secondary | ICD-10-CM | POA: Diagnosis not present

## 2020-08-24 DIAGNOSIS — E785 Hyperlipidemia, unspecified: Secondary | ICD-10-CM

## 2020-08-24 DIAGNOSIS — I2510 Atherosclerotic heart disease of native coronary artery without angina pectoris: Secondary | ICD-10-CM | POA: Diagnosis not present

## 2020-08-24 DIAGNOSIS — N183 Chronic kidney disease, stage 3 unspecified: Secondary | ICD-10-CM

## 2020-08-24 NOTE — Patient Instructions (Signed)
Medication Instructions:  Your physician recommends that you continue on your current medications as directed. Please refer to the Current Medication list given to you today.  *If you need a refill on your cardiac medications before your next appointment, please call your pharmacy*   Lab Work: BMET today  If you have labs (blood work) drawn today and your tests are completely normal, you will receive your results only by: Marland Kitchen MyChart Message (if you have MyChart) OR . A paper copy in the mail If you have any lab test that is abnormal or we need to change your treatment, we will call you to review the results.   Testing/Procedures: Your physician has recommended that you have a sleep study. This test records several body functions during sleep, including: brain activity, eye movement, oxygen and carbon dioxide blood levels, heart rate and rhythm, breathing rate and rhythm, the flow of air through your mouth and nose, snoring, body muscle movements, and chest and belly movement.  Follow-Up: At [facility], you and your health needs are our priority.  As part of our continuing mission to provide you with exceptional heart care, we have created designated Provider Care Teams.  These Care Teams include your primary Cardiologist (physician) and Advanced Practice Providers (APPs -  Physician Assistants and Nurse Practitioners) who all work together to provide you with the care you need, when you need it.  We recommend signing up for the patient portal called "MyChart".  Sign up information is provided on this After Visit Summary.  MyChart is used to connect with patients for Virtual Visits (Telemedicine).  Patients are able to view lab/test results, encounter notes, upcoming appointments, etc.  Non-urgent messages can be sent to your provider as well.   To learn more about what you can do with MyChart, go to [email or web address].    Your next appointment:   3 month(s)  The format for your  next appointment:   In Person  Provider:   You will see one of the following Advanced Practice Providers on your designated Care Team:    Rosaria Ferries, PA-C  Jory Sims, DNP, ANP  Then, Donato Heinz, MD will plan to see you again in 6 month(s).

## 2020-08-25 ENCOUNTER — Encounter: Payer: Self-pay | Admitting: Hematology & Oncology

## 2020-08-25 ENCOUNTER — Inpatient Hospital Stay: Payer: Medicare Other

## 2020-08-25 ENCOUNTER — Other Ambulatory Visit: Payer: Self-pay | Admitting: *Deleted

## 2020-08-25 ENCOUNTER — Inpatient Hospital Stay: Payer: Medicare Other | Attending: Hematology & Oncology | Admitting: Hematology & Oncology

## 2020-08-25 VITALS — BP 107/54 | HR 78 | Temp 98.3°F | Resp 20 | Wt 232.4 lb

## 2020-08-25 DIAGNOSIS — C50011 Malignant neoplasm of nipple and areola, right female breast: Secondary | ICD-10-CM

## 2020-08-25 DIAGNOSIS — Z170 Estrogen receptor positive status [ER+]: Secondary | ICD-10-CM | POA: Insufficient documentation

## 2020-08-25 DIAGNOSIS — Z87891 Personal history of nicotine dependence: Secondary | ICD-10-CM | POA: Insufficient documentation

## 2020-08-25 DIAGNOSIS — E559 Vitamin D deficiency, unspecified: Secondary | ICD-10-CM

## 2020-08-25 DIAGNOSIS — J449 Chronic obstructive pulmonary disease, unspecified: Secondary | ICD-10-CM | POA: Diagnosis not present

## 2020-08-25 DIAGNOSIS — Z7189 Other specified counseling: Secondary | ICD-10-CM

## 2020-08-25 DIAGNOSIS — C50912 Malignant neoplasm of unspecified site of left female breast: Secondary | ICD-10-CM | POA: Insufficient documentation

## 2020-08-25 DIAGNOSIS — I10 Essential (primary) hypertension: Secondary | ICD-10-CM

## 2020-08-25 HISTORY — DX: Malignant neoplasm of unspecified site of left female breast: C50.912

## 2020-08-25 HISTORY — DX: Other specified counseling: Z71.89

## 2020-08-25 LAB — CMP (CANCER CENTER ONLY)
ALT: 17 U/L (ref 0–44)
AST: 12 U/L — ABNORMAL LOW (ref 15–41)
Albumin: 4 g/dL (ref 3.5–5.0)
Alkaline Phosphatase: 108 U/L (ref 38–126)
Anion gap: 7 (ref 5–15)
BUN: 43 mg/dL — ABNORMAL HIGH (ref 8–23)
CO2: 27 mmol/L (ref 22–32)
Calcium: 10.3 mg/dL (ref 8.9–10.3)
Chloride: 105 mmol/L (ref 98–111)
Creatinine: 1.79 mg/dL — ABNORMAL HIGH (ref 0.44–1.00)
GFR, Estimated: 30 mL/min — ABNORMAL LOW (ref >60)
Glucose, Bld: 91 mg/dL (ref 70–99)
Potassium: 4 mmol/L (ref 3.5–5.1)
Sodium: 139 mmol/L (ref 135–145)
Total Bilirubin: 0.3 mg/dL (ref 0.3–1.2)
Total Protein: 6.7 g/dL (ref 6.5–8.1)

## 2020-08-25 LAB — BASIC METABOLIC PANEL
BUN/Creatinine Ratio: 18 (ref 12–28)
BUN: 36 mg/dL — ABNORMAL HIGH (ref 8–27)
CO2: 21 mmol/L (ref 20–29)
Calcium: 10.2 mg/dL (ref 8.7–10.3)
Chloride: 103 mmol/L (ref 96–106)
Creatinine, Ser: 1.95 mg/dL — ABNORMAL HIGH (ref 0.57–1.00)
GFR calc Af Amer: 29 mL/min/{1.73_m2} — ABNORMAL LOW (ref >59)
GFR calc non Af Amer: 25 mL/min/{1.73_m2} — ABNORMAL LOW (ref >59)
Glucose: 94 mg/dL (ref 65–99)
Potassium: 4.6 mmol/L (ref 3.5–5.2)
Sodium: 141 mmol/L (ref 134–144)

## 2020-08-25 LAB — CBC WITH DIFFERENTIAL (CANCER CENTER ONLY)
Abs Immature Granulocytes: 0.02 10*3/uL (ref 0.00–0.07)
Basophils Absolute: 0 10*3/uL (ref 0.0–0.1)
Basophils Relative: 0 %
Eosinophils Absolute: 0.2 10*3/uL (ref 0.0–0.5)
Eosinophils Relative: 2 %
HCT: 42.8 % (ref 36.0–46.0)
Hemoglobin: 13.3 g/dL (ref 12.0–15.0)
Immature Granulocytes: 0 %
Lymphocytes Relative: 27 %
Lymphs Abs: 2.3 10*3/uL (ref 0.7–4.0)
MCH: 28.9 pg (ref 26.0–34.0)
MCHC: 31.1 g/dL (ref 30.0–36.0)
MCV: 93 fL (ref 80.0–100.0)
Monocytes Absolute: 1.1 10*3/uL — ABNORMAL HIGH (ref 0.1–1.0)
Monocytes Relative: 13 %
Neutro Abs: 4.7 10*3/uL (ref 1.7–7.7)
Neutrophils Relative %: 58 %
Platelet Count: 223 10*3/uL (ref 150–400)
RBC: 4.6 MIL/uL (ref 3.87–5.11)
RDW: 14.7 % (ref 11.5–15.5)
WBC Count: 8.2 10*3/uL (ref 4.0–10.5)
nRBC: 0 % (ref 0.0–0.2)

## 2020-08-25 LAB — CORTISOL, URINE, FREE
Cortisol (Ur), Free: 18 ug/24 hr (ref 6–42)
Cortisol,F,ug/L,U: 13 ug/L
Total Volume: 1350

## 2020-08-25 LAB — LACTATE DEHYDROGENASE: LDH: 157 U/L (ref 98–192)

## 2020-08-25 MED ORDER — AMLODIPINE BESYLATE 10 MG PO TABS: 10.0000 mg | ORAL_TABLET | Freq: Every day | ORAL | 3 refills | Status: DC

## 2020-08-25 NOTE — Progress Notes (Signed)
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 New Patient (Initial Visit)    Hx Breast Cancer 2020.  : I just moved up here from [place].  I have breast cancer.  HPI: Jessica [name] is a very charming 74 year old African-American female.  She is originally from [place].  She was a Psychologist, counselling.  She had been up in [place].  I think she and her husband retired down in [place].  They are in [place] for I think 12 years or so.  Unfortunately, her husband recently passed away.  His family is up in this area.  She subsequently moved up here.  She was diagnosed with breast cancer back in 2020 I think.  Unfortunately, I have absolutely no records from [facility] about the breast cancer.  She said that she had surgery in January 2020.  She said that she did not need radiation.  She then said that she had 1 dose of radiation.  She is on Arimidex right now.  She is not sure about the name of the oncologist that she was following down in [place].  There is no history of breast cancer in the family.  She has 2 children.  She still has her "female" parts.  She did smoke.  She stopped smoking last year.  She was kindly referred to the [facility] for an evaluation.  She has had no problems with nausea or vomiting.  She does have underlying COPD.  She was recently hospitalized with hypertension.  She sees Dr. Bryan Lemma for general medical issues.  She is very nice.  Currently, I would say her performance status is ECOG 1.      Past Medical History:  Diagnosis Date  . Asthma   . Cancer ([place])   . CHF (congestive heart failure) ([place])   . Chronic kidney disease   . COPD (chronic obstructive pulmonary disease) ([place])   . Hyperlipidemia   . Hypertension   . Stroke [facility])   :  Past Surgical History:  Procedure Laterality Date  . stomach surgery  1990  :   Current Outpatient  Medications:  .  albuterol (ACCUNEB) 1.25 MG/3ML nebulizer solution, Take 3 mLs (1.25 mg total) by nebulization every 6 (six) hours as needed for wheezing., Disp: 75 mL, Rfl: 3 .  albuterol (VENTOLIN HFA) 108 (90 Base) MCG/ACT inhaler, Inhale 2 puffs into the lungs every 6 (six) hours as needed. (Patient taking differently: Inhale 2 puffs into the lungs every 6 (six) hours as needed for wheezing. ), Disp: 1 each, Rfl: 3 .  amLODipine (NORVASC) 10 MG tablet, Take 1 tablet (10 mg total) by mouth daily., Disp: 90 tablet, Rfl: 3 .  anastrozole (ARIMIDEX) 1 MG tablet, Take 1 tablet (1 mg total) by mouth daily., Disp: 90 tablet, Rfl: 3 .  aspirin EC 81 MG EC tablet, Take 1 tablet (81 mg total) by mouth daily. Swallow whole., Disp: 90 tablet, Rfl: 0 .  atorvastatin (LIPITOR) 80 MG tablet, Take 80 mg by mouth daily., Disp: , Rfl:  .  carvedilol (COREG) 25 MG tablet, Take 1 tablet (25 mg total) by mouth 2 (two) times daily., Disp: 180 tablet, Rfl: 3 .  cloNIDine (CATAPRES) 0.2 MG tablet, Take 1 tablet (0.2 mg total) by mouth 2 (two) times daily., Disp: 180 tablet, Rfl: 3 .  doxazosin (CARDURA) 2 MG tablet, TAKE 1 TABLET(2 MG) BY MOUTH  TWICE DAILY (Patient taking differently: Take 2 mg by mouth 2 (two) times daily. ), Disp: 180 tablet, Rfl: 0 .  Fluticasone-Umeclidin-Vilant (TRELEGY ELLIPTA) 200-62.5-25 MCG/INH AEPB, Inhale 1 puff into the lungs daily., Disp: 60 each, Rfl: 11 .  traZODone (DESYREL) 50 MG tablet, Take 1 tablet (50 mg total) by mouth at bedtime as needed for sleep., Disp: 30 tablet, Rfl: 1 .  Vitamin D, Ergocalciferol, (DRISDOL) 1.25 MG (50000 UNIT) CAPS capsule, Take 1 capsule (50,000 Units total) by mouth every 7 (seven) days., Disp: 5 capsule, Rfl: 3  Current Facility-Administered Medications:  .  nitroGLYCERIN (NITROSTAT) SL tablet 0.4 mg, 0.4 mg, Sublingual, Q5 min PRN, Donato Heinz, MD, 0.4 mg at 08/11/20 1610:  :  No Known Allergies:  Family History  Problem Relation Age  of Onset  . Stroke Mother   . Heart disease Mother   . Hyperlipidemia Mother   . Heart disease Maternal Aunt   . Hyperlipidemia Maternal Aunt   . Stroke Maternal Aunt   :  Social History   Socioeconomic History  . Marital status: Widowed    Spouse name: Not on file  . Number of children: Not on file  . Years of education: Not on file  . Highest education level: Not on file  Occupational History  . Not on file  Tobacco Use  . Smoking status: Former Smoker    Types: Cigarettes    Quit date: 2020    Years since quitting: 1.8  . Smokeless tobacco: Never Used  Vaping Use  . Vaping Use: Never used  Substance and Sexual Activity  . Alcohol use: Never  . Drug use: Never  . Sexual activity: Not Currently  Other Topics Concern  . Not on file  Social History Narrative  . Not on file   Social Determinants of Health   Financial Resource Strain:   . Difficulty of Paying Living Expenses: Not on file  Food Insecurity:   . Worried About Charity fundraiser in the Last Year: Not on file  . Ran Out of Food in the Last Year: Not on file  Transportation Needs:   . Lack of Transportation (Medical): Not on file  . Lack of Transportation (Non-Medical): Not on file  Physical Activity:   . Days of Exercise per Week: Not on file  . Minutes of Exercise per Session: Not on file  Stress:   . Feeling of Stress : Not on file  Social Connections:   . Frequency of Communication with Friends and Family: Not on file  . Frequency of Social Gatherings with Friends and Family: Not on file  . Attends Religious Services: Not on file  . Active Member of Clubs or Organizations: Not on file  . Attends Archivist Meetings: Not on file  . Marital Status: Not on file  Intimate Partner Violence:   . Fear of Current or Ex-Partner: Not on file  . Emotionally Abused: Not on file  . Physically Abused: Not on file  . Sexually Abused: Not on file  :  Review of Systems  Constitutional: Negative.    HENT: Negative.   Eyes: Negative.   Respiratory: Negative.   Cardiovascular: Negative.   Gastrointestinal: Negative.   Genitourinary: Negative.   Musculoskeletal: Negative.   Skin: Negative.   Neurological: Negative.   Endo/Heme/Allergies: Negative.   Psychiatric/Behavioral: Negative.      Exam:  This is a well-developed and well-nourished African-American female in no obvious distress.  Her vital signs show  temperature of 98.3.  Pulse 78.  Blood pressure 107/54.  Weight is 232 pounds.  Head and neck exam shows no scleral icterus.  There is no adenopathy in the neck.  There is no oral lesions.  Lungs are with decent breath sounds bilaterally.  Cardiac exam regular rate and rhythm with no murmurs, rubs or bruits.  Breast exam shows right breast with no masses, edema or erythema.  There is no right axillary adenopathy.  Left breast shows a lumpectomy scar at the 2 o'clock position.  There is a little bit of a keloid associated with this.  There is no distinct mass in the left breast.  There is no left nipple discharge.  There is no left axillary adenopathy.  Abdomen is soft.  She has good bowel sounds.  There is no fluid wave.  There is no palpable liver or spleen tip.  Back exam shows no tenderness over the spine, ribs or hips.  Extremities shows no clubbing, cyanosis or edema.  There is no lymphedema in the left arm.  Skin exam shows no rashes, ecchymoses or petechia.  Neurological exam shows no focal neurological deficits.    @IPVITALS @   Recent Labs    08/25/20 1453  WBC 8.2  HGB 13.3  HCT 42.8  PLT 223   Recent Labs    08/24/20 1444 08/25/20 1453  NA 141 139  K 4.6 4.0  CL 103 105  CO2 21 27  GLUCOSE 94 91  BUN 36* 43*  CREATININE 1.95* 1.79*  CALCIUM 10.2 10.3    Blood smear review: None  Pathology: None    Assessment and Plan: Jessica [name] is a very nice 74 year old postmenopausal African-American female with what certainly appears to be an early stage ductal  carcinoma of the left breast.  Again somehow we will going have to try to get information from her oncologist in [place].  I am not sure how we will be able to do this since I cannot find the name of the doctor that she saw.  We will have to see about calling down there to see if the somehow may have information on her.  We will continue her on the Arimidex.  She said that she needs 5 years of Arimidex.  Again I have to believe that she has early stage breast cancer.  She does need to have a mammogram done.  We will get this set up for her.  She also needs to have a bone density test done.  I would think that she should have pretty good bone density.  I would have to think that the risk of recurrence of the tumor is going be quite low.  I would like to see her back probably in about 4 months.  We will get her through the holiday season and also the wintertime.  I spent about 60 minutes with she and her brother-in-law.  They are both very very nice.

## 2020-08-26 ENCOUNTER — Telehealth: Payer: Self-pay

## 2020-08-26 LAB — CANCER ANTIGEN 27.29: CA 27.29: 13.2 U/mL (ref 0.0–38.6)

## 2020-08-26 NOTE — Telephone Encounter (Signed)
No vm avail, appts scheduled, printed, mailed to pt per 08/25/20 los for 12/23/20.... AOM

## 2020-08-27 ENCOUNTER — Telehealth: Payer: Self-pay | Admitting: Cardiology

## 2020-08-27 ENCOUNTER — Telehealth: Payer: Self-pay

## 2020-08-27 DIAGNOSIS — R8290 Unspecified abnormal findings in urine: Secondary | ICD-10-CM

## 2020-08-27 DIAGNOSIS — R7989 Other specified abnormal findings of blood chemistry: Secondary | ICD-10-CM

## 2020-08-27 NOTE — Telephone Encounter (Signed)
-----   Message from Sueanne Margarita, MD sent at 08/26/2020  7:03 PM EST ----- Mildly elevated normetanephrines in 24 hour urine - please refer to Dr. Chalmers Cater in Endocrinology for further workup

## 2020-08-27 NOTE — Telephone Encounter (Signed)
Referral has been placed. 

## 2020-08-27 NOTE — Telephone Encounter (Signed)
No PA required for split night, patient scheduled for 10/05/20 at 8pm. Patient aware of appt.

## 2020-08-27 NOTE — Addendum Note (Signed)
Addended by: Wonda Horner on: 08/27/2020 01:33 PM   Modules accepted: Orders

## 2020-08-31 ENCOUNTER — Encounter: Payer: Self-pay | Admitting: Pharmacist

## 2020-08-31 ENCOUNTER — Ambulatory Visit (INDEPENDENT_AMBULATORY_CARE_PROVIDER_SITE_OTHER): Payer: Medicare Other

## 2020-08-31 ENCOUNTER — Other Ambulatory Visit: Payer: Self-pay

## 2020-08-31 ENCOUNTER — Ambulatory Visit (INDEPENDENT_AMBULATORY_CARE_PROVIDER_SITE_OTHER): Payer: Medicare Other | Admitting: Pharmacist

## 2020-08-31 VITALS — BP 104/50

## 2020-08-31 DIAGNOSIS — I10 Essential (primary) hypertension: Secondary | ICD-10-CM | POA: Diagnosis not present

## 2020-08-31 DIAGNOSIS — I493 Ventricular premature depolarization: Secondary | ICD-10-CM

## 2020-08-31 MED ORDER — DOXAZOSIN MESYLATE 1 MG PO TABS: 1.0000 mg | ORAL_TABLET | Freq: Two times a day (BID) | ORAL | 1 refills | Status: DC

## 2020-08-31 NOTE — Progress Notes (Signed)
Patient ID: Jessica [name]                 DOB: 1946/10/16                      MRN: [id number]     HPI: Jessica [name] is a 74 y.o. female referred by Dr. Gardiner Rhyme to HTN clinic. PMH includes asthma, cancer, heart failure, CKD, COPD, hyperlipidemia, hypertension, and stroke. Patient presents to OV accompany by niece and expressed some confusion related to her blood pressure medication, and ZIO monitor placement.  She reports feeling lightheadedness, but denies falls, chest pain, or any other problems. She will like to take less medication if possible, but reports compliance with current therapy. Irbesartan and spironolactone were discontinued by Dr Gardiner Rhyme on 08/25/2020 after Scr increased from 1.11 baseline to 1.95.  Current HTN meds:  Amlodipine 10mg  daily Carvedilol 25mg  twice daily Clonidine 0.2mg  twice daily Diltiazem 240mg  daily Doxazosin 2mg  twice daily (9am - 7pm)  Previously tried:  Chlorthalidone 25mg  daily HCTZ 25mg  daily Irbesartan 300mg  daily Nifedipine 60mg  Spironolactone 50mg  daily Valsartan 160mg  daily  BP goal: <130/80  Family History: The patient's family history includes Heart disease in her maternal aunt and mother; Hyperlipidemia in her maternal aunt and mother; Stroke in her maternal aunt and mother.  Social History: former smoker, denies alcohol use  Diet: tried to limit salt as much as possible  Exercise: activities of daily living  Home BP readings:  7 readings provided; average 112/63, HR range 62 to 85bpm  Wt Readings from Last 3 Encounters:  08/25/20 232 lb 6.4 oz (105.4 kg)  08/24/20 232 lb (105.2 kg)  08/20/20 234 lb 3.2 oz (106.2 kg)   BP Readings from Last 3 Encounters:  08/31/20 (!) 104/50  08/25/20 (!) 107/54  08/24/20 124/76   Pulse Readings from Last 3 Encounters:  08/25/20 78  08/24/20 (!) 102  08/20/20 84    Past Medical History:  Diagnosis Date  . Asthma   . Cancer ([place])   . CHF (congestive heart failure) ([place])   .  Chronic kidney disease   . COPD (chronic obstructive pulmonary disease) ([place])   . Goals of care, counseling/discussion 08/25/2020  . Hyperlipidemia   . Hypertension   . Stage I breast cancer, left ([place]) 08/25/2020  . Stroke [facility])     Current Outpatient Medications on File Prior to Visit  Medication Sig Dispense Refill  . albuterol (ACCUNEB) 1.25 MG/3ML nebulizer solution Take 3 mLs (1.25 mg total) by nebulization every 6 (six) hours as needed for wheezing. 75 mL 3  . albuterol (VENTOLIN HFA) 108 (90 Base) MCG/ACT inhaler Inhale 2 puffs into the lungs every 6 (six) hours as needed. (Patient taking differently: Inhale 2 puffs into the lungs every 6 (six) hours as needed for wheezing. ) 1 each 3  . amLODipine (NORVASC) 10 MG tablet Take 1 tablet (10 mg total) by mouth daily. 90 tablet 3  . anastrozole (ARIMIDEX) 1 MG tablet Take 1 tablet (1 mg total) by mouth daily. 90 tablet 3  . aspirin EC 81 MG EC tablet Take 1 tablet (81 mg total) by mouth daily. Swallow whole. 90 tablet 0  . carvedilol (COREG) 25 MG tablet Take 1 tablet (25 mg total) by mouth 2 (two) times daily. 180 tablet 3  . cloNIDine (CATAPRES) 0.2 MG tablet Take 1 tablet (0.2 mg total) by mouth 2 (two) times daily. 180 tablet 3  . diltiazem (DILACOR XR) 240 MG 24 hr  capsule Take 240 mg by mouth daily.    . Fluticasone-Umeclidin-Vilant (TRELEGY ELLIPTA) 200-62.5-25 MCG/INH AEPB Inhale 1 puff into the lungs daily. 60 each 11  . hydrochlorothiazide (HYDRODIURIL) 25 MG tablet Take 25 mg by mouth daily.    . sertraline (ZOLOFT) 25 MG tablet Take 25 mg by mouth daily.    . traZODone (DESYREL) 50 MG tablet Take 1 tablet (50 mg total) by mouth at bedtime as needed for sleep. 30 tablet 1  . Vitamin D, Ergocalciferol, (DRISDOL) 1.25 MG (50000 UNIT) CAPS capsule Take 1 capsule (50,000 Units total) by mouth every 7 (seven) days. 5 capsule 3  . atorvastatin (LIPITOR) 80 MG tablet Take 80 mg by mouth daily. (Patient not taking: Reported on  08/31/2020)     Current Facility-Administered Medications on File Prior to Visit  Medication Dose Route Frequency Provider Last Rate Last Admin  . nitroGLYCERIN (NITROSTAT) SL tablet 0.4 mg  0.4 mg Sublingual Q5 min PRN Donato Heinz, MD   0.4 mg at 08/11/20 1610    No Known Allergies  Blood pressure (!) 104/50.  Hypertension Blood pressure remains at goal but patient reports worsen dizziness since taking doxazosin 2mg . Unclear if patient stopped taking irbesartan and spironolactone as instructed by Dr Gardiner Rhyme. She reports stopping therapy as soon as RN called, but she had both meds in her medication bag this morning.  I removed spironolactone, Irbesartan, and doxazosin 2mg  from the medication bag. Will decrease doxazosin to 1mg  BID to decrease dizziness.   We got nurse Maudie Mercury) to assist with heart monitor placement, and will repeat BMET today. Noted patient already has scheduled follow up for sleep study in December and appointment with APP in February.    Temekia Caskey Rodriguez-Guzman PharmD, BCPS, [place] [id number] 09/03/2020 8:44 AM

## 2020-08-31 NOTE — Patient Instructions (Addendum)
Return for a follow up appointment AS NEEDED  *REPEAT BLOOD WORK TODAY*  Check your blood pressure at home daily (if able) and keep record of the readings.  Take your BP meds as follows: *DECREASE doxazosin dose to 1mg  twice daily*  Bring all of your meds, your BP cuff and your record of home blood pressures to your next appointment.  Exercise as you're able, try to walk approximately 30 minutes per day.  Keep salt intake to a minimum, especially watch canned and prepared boxed foods.  Eat more fresh fruits and vegetables and fewer canned items.  Avoid eating in fast food restaurants.    HOW TO TAKE YOUR BLOOD PRESSURE: . Rest 5 minutes before taking your blood pressure. .  Don't smoke or drink caffeinated beverages for at least 30 minutes before. . Take your blood pressure before (not after) you eat. . Sit comfortably with your back supported and both feet on the floor (don't cross your legs). . Elevate your arm to heart level on a table or a desk. . Use the proper sized cuff. It should fit smoothly and snugly around your bare upper arm. There should be enough room to slip a fingertip under the cuff. The bottom edge of the cuff should be 1 inch above the crease of the elbow. . Ideally, take 3 measurements at one sitting and record the average.

## 2020-09-01 ENCOUNTER — Telehealth: Payer: Self-pay | Admitting: Cardiology

## 2020-09-01 LAB — BASIC METABOLIC PANEL
BUN/Creatinine Ratio: 22 (ref 12–28)
BUN: 33 mg/dL — ABNORMAL HIGH (ref 8–27)
CO2: 20 mmol/L (ref 20–29)
Calcium: 10.2 mg/dL (ref 8.7–10.3)
Chloride: 106 mmol/L (ref 96–106)
Creatinine, Ser: 1.52 mg/dL — ABNORMAL HIGH (ref 0.57–1.00)
GFR calc Af Amer: 39 mL/min/{1.73_m2} — ABNORMAL LOW (ref >59)
GFR calc non Af Amer: 34 mL/min/{1.73_m2} — ABNORMAL LOW (ref >59)
Glucose: 82 mg/dL (ref 65–99)
Potassium: 4.7 mmol/L (ref 3.5–5.2)
Sodium: 143 mmol/L (ref 134–144)

## 2020-09-01 NOTE — Telephone Encounter (Signed)
Spoke with the patient and informed of why she has been referred to Dr. Chalmers Cater. Patient verbalized understanding.

## 2020-09-01 NOTE — Telephone Encounter (Signed)
[facility] with Dr. Almetta Lovely office is following up regarding referral from Dr. Radford Pax. Per [employer], the patient does not understand why she is being referred to Dr. Almetta Lovely office. Please return call to patient to discuss.   If additional questions for Dr. Almetta Lovely office, please contact [employer] at [phone] ([phone]).

## 2020-09-02 ENCOUNTER — Other Ambulatory Visit: Payer: Self-pay | Admitting: *Deleted

## 2020-09-02 DIAGNOSIS — I10 Essential (primary) hypertension: Secondary | ICD-10-CM

## 2020-09-02 DIAGNOSIS — N183 Chronic kidney disease, stage 3 unspecified: Secondary | ICD-10-CM

## 2020-09-03 ENCOUNTER — Encounter: Payer: Self-pay | Admitting: Pharmacist

## 2020-09-03 DIAGNOSIS — I10 Essential (primary) hypertension: Secondary | ICD-10-CM | POA: Insufficient documentation

## 2020-09-03 NOTE — Assessment & Plan Note (Signed)
Blood pressure remains at goal but patient reports worsen dizziness since taking doxazosin 2mg . Unclear if patient stopped taking irbesartan and spironolactone as instructed by Dr Gardiner Rhyme. She reports stopping therapy as soon as RN called, but she had both meds in her medication bag this morning.  I removed spironolactone, Irbesartan, and doxazosin 2mg  from the medication bag. Will decrease doxazosin to 1mg  BID to decrease dizziness.   We got nurse Maudie Mercury) to assist with heart monitor placement, and will repeat BMET today. Noted patient already has scheduled follow up for sleep study in December and appointment with APP in February.

## 2020-09-13 ENCOUNTER — Telehealth: Payer: Self-pay | Admitting: Family Medicine

## 2020-09-13 NOTE — Telephone Encounter (Signed)
Spoke with patient caregiver he stated that she has lots of appt for December and would like a call back in January to schedule AWVI

## 2020-10-04 ENCOUNTER — Telehealth: Payer: Self-pay | Admitting: Cardiology

## 2020-10-04 NOTE — Telephone Encounter (Signed)
Yes can we schedule appointment with me in next 1-2 weeks?

## 2020-10-04 NOTE — Telephone Encounter (Signed)
Received critical monitor result from [employer]. Pt wore zio monitor from 11/16-11/30 Report showed two episodes for complete heart block. 1st-11/25 @ 2:42 am- HR 32  lasting 8.2 sec 2nd-11/25 @ 3:27 am HR 31 lasting 15 sec  Pt has an appointment on 2/10. Will forward to MD for recommendations and to see if pt needs a sooner appointment.

## 2020-10-04 NOTE — Telephone Encounter (Signed)
Brittney from [facility] calling with critical results. Please call back.

## 2020-10-05 ENCOUNTER — Ambulatory Visit (HOSPITAL_BASED_OUTPATIENT_CLINIC_OR_DEPARTMENT_OTHER): Payer: Medicare Other | Attending: Cardiology | Admitting: Cardiovascular Disease

## 2020-10-05 ENCOUNTER — Other Ambulatory Visit: Payer: Self-pay

## 2020-10-05 DIAGNOSIS — G4733 Obstructive sleep apnea (adult) (pediatric): Secondary | ICD-10-CM | POA: Diagnosis not present

## 2020-10-05 DIAGNOSIS — G478 Other sleep disorders: Secondary | ICD-10-CM | POA: Diagnosis not present

## 2020-10-05 DIAGNOSIS — I2510 Atherosclerotic heart disease of native coronary artery without angina pectoris: Secondary | ICD-10-CM | POA: Diagnosis not present

## 2020-10-05 DIAGNOSIS — I10 Essential (primary) hypertension: Secondary | ICD-10-CM

## 2020-10-05 NOTE — Telephone Encounter (Addendum)
Spoke with pt and informed MD would like to schedule an appointment to discuss monitor results.  Pt verbalized understanding. Appointment scheduled for 10/18/20 at 10 am.

## 2020-10-12 ENCOUNTER — Other Ambulatory Visit: Payer: Self-pay

## 2020-10-12 ENCOUNTER — Encounter: Payer: Self-pay | Admitting: Pulmonary Disease

## 2020-10-12 ENCOUNTER — Ambulatory Visit: Payer: Medicare Other | Admitting: Pulmonary Disease

## 2020-10-12 ENCOUNTER — Ambulatory Visit (INDEPENDENT_AMBULATORY_CARE_PROVIDER_SITE_OTHER): Payer: Medicare Other | Admitting: Pulmonary Disease

## 2020-10-12 VITALS — BP 124/68 | HR 63 | Temp 97.6°F | Ht 68.0 in | Wt 232.4 lb

## 2020-10-12 DIAGNOSIS — Z87891 Personal history of nicotine dependence: Secondary | ICD-10-CM | POA: Diagnosis not present

## 2020-10-12 DIAGNOSIS — J449 Chronic obstructive pulmonary disease, unspecified: Secondary | ICD-10-CM

## 2020-10-12 DIAGNOSIS — Z23 Encounter for immunization: Secondary | ICD-10-CM

## 2020-10-12 DIAGNOSIS — Z0000 Encounter for general adult medical examination without abnormal findings: Secondary | ICD-10-CM | POA: Insufficient documentation

## 2020-10-12 LAB — PULMONARY FUNCTION TEST
DL/VA % pred: 120 %
DL/VA: 4.84 ml/min/mmHg/L
DLCO cor % pred: 90 %
DLCO cor: 19.94 ml/min/mmHg
DLCO unc % pred: 90 %
DLCO unc: 19.94 ml/min/mmHg
FEF 25-75 Post: 0.72 L/sec
FEF 25-75 Pre: 0.5 L/sec
FEF2575-%Change-Post: 43 %
FEF2575-%Pred-Post: 37 %
FEF2575-%Pred-Pre: 26 %
FEV1-%Change-Post: 8 %
FEV1-%Pred-Post: 55 %
FEV1-%Pred-Pre: 51 %
FEV1-Post: 1.2 L
FEV1-Pre: 1.1 L
FEV1FVC-%Change-Post: 5 %
FEV1FVC-%Pred-Pre: 81 %
FEV6-%Change-Post: 1 %
FEV6-%Pred-Post: 65 %
FEV6-%Pred-Pre: 64 %
FEV6-Post: 1.74 L
FEV6-Pre: 1.71 L
FEV6FVC-%Change-Post: 1 %
FEV6FVC-%Pred-Post: 102 %
FEV6FVC-%Pred-Pre: 101 %
FVC-%Change-Post: 2 %
FVC-%Pred-Post: 64 %
FVC-%Pred-Pre: 63 %
FVC-Post: 1.79 L
FVC-Pre: 1.75 L
Post FEV1/FVC ratio: 67 %
Post FEV6/FVC ratio: 99 %
Pre FEV1/FVC ratio: 63 %
Pre FEV6/FVC Ratio: 98 %
RV % pred: 113 %
RV: 2.77 L
TLC % pred: 82 %
TLC: 4.65 L

## 2020-10-12 LAB — CBC WITH DIFFERENTIAL/PLATELET
Basophils Absolute: 0 10*3/uL (ref 0.0–0.1)
Basophils Relative: 0.5 % (ref 0.0–3.0)
Eosinophils Absolute: 0.2 10*3/uL (ref 0.0–0.7)
Eosinophils Relative: 2.2 % (ref 0.0–5.0)
HCT: 42 % (ref 36.0–46.0)
Hemoglobin: 13.8 g/dL (ref 12.0–15.0)
Lymphocytes Relative: 13.2 % (ref 12.0–46.0)
Lymphs Abs: 1.2 10*3/uL (ref 0.7–4.0)
MCHC: 32.9 g/dL (ref 30.0–36.0)
MCV: 87.9 fl (ref 78.0–100.0)
Monocytes Absolute: 1.2 10*3/uL — ABNORMAL HIGH (ref 0.1–1.0)
Monocytes Relative: 13 % — ABNORMAL HIGH (ref 3.0–12.0)
Neutro Abs: 6.6 10*3/uL (ref 1.4–7.7)
Neutrophils Relative %: 71.1 % (ref 43.0–77.0)
Platelets: 173 10*3/uL (ref 150.0–400.0)
RBC: 4.78 Mil/uL (ref 3.87–5.11)
RDW: 14.9 % (ref 11.5–15.5)
WBC: 9.3 10*3/uL (ref 4.0–10.5)

## 2020-10-12 LAB — COMPREHENSIVE METABOLIC PANEL
ALT: 12 U/L (ref 0–35)
AST: 12 U/L (ref 0–37)
Albumin: 4.1 g/dL (ref 3.5–5.2)
Alkaline Phosphatase: 135 U/L — ABNORMAL HIGH (ref 39–117)
BUN: 37 mg/dL — ABNORMAL HIGH (ref 6–23)
CO2: 33 mEq/L — ABNORMAL HIGH (ref 19–32)
Calcium: 10.2 mg/dL (ref 8.4–10.5)
Chloride: 101 mEq/L (ref 96–112)
Creatinine, Ser: 1.56 mg/dL — ABNORMAL HIGH (ref 0.40–1.20)
GFR: 33.1 mL/min — ABNORMAL LOW (ref >60.00)
Glucose, Bld: 80 mg/dL (ref 70–99)
Potassium: 3.6 mEq/L (ref 3.5–5.1)
Sodium: 140 mEq/L (ref 135–145)
Total Bilirubin: 0.6 mg/dL (ref 0.2–1.2)
Total Protein: 7.1 g/dL (ref 6.0–8.3)

## 2020-10-12 MED ORDER — TRELEGY ELLIPTA 200-62.5-25 MCG/INH IN AEPB: 1.0000 | INHALATION_SPRAY | Freq: Every day | RESPIRATORY_TRACT | 0 refills | Status: DC

## 2020-10-12 NOTE — Assessment & Plan Note (Signed)
Plan: Referral to lung cancer screening Continue not smoke Pneumovax 23

## 2020-10-12 NOTE — Assessment & Plan Note (Signed)
Plan: Pneumovax 23 today 

## 2020-10-12 NOTE — Assessment & Plan Note (Signed)
Plan: Continue Trelegy Ellipta 200 Lab work today Pneumovax 23 today Follow-up in 3 months

## 2020-10-12 NOTE — Patient Instructions (Addendum)
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  - The less you use it, the better it will work when you need it. - Ok to use up to 2 puffs  every 4 hours if you must but call for immediate appointment if use goes up over your usual need - Don't leave home without it !!  (think of it like the spare tire for your car)   Note your daily symptoms > remember "red flags" for COPD:   >>>Increase in cough >>>increase in sputum production >>>increase in shortness of breath or activity  intolerance.   If you notice these symptoms, please call the office to be seen.    2. Healthcare maintenance  Pneumovax 23 today   3. Former smoker  - Ambulatory Referral for [employer]  We will refer you today to our lung cancer screening program >>>This is based off of your 27.5 pack-year smoking history >>> This is a recommendation from the [place] preventative services task force (USPSTF) >>>The USPSTF recommends annual screening for lung cancer with low-dose computed tomography (LDCT) in adults aged 61 to 80 years who have a 20 pack-year smoking history and currently smoke or have quit within the past 15 years. Screening should be discontinued once a person has not smoked for 15 years or develops a health problem that substantially limits life expectancy or the ability or willingness to have curative lung surgery.   Our office will call you and set up an appointment with Eric Form (Nurse Practitioner) who leads this program.  This appointment  takes place in our office.  After completing this meeting with Eric Form NP you will get a low-dose CT as the screening >>>We will call you with those results    We recommend today:  Orders Placed This Encounter  Procedures  . Alpha-1 antitrypsin phenotype    Standing Status:   Future    Standing Expiration Date:   10/12/2021  . Comp Met (CMET)    Standing Status:   Future    Standing Expiration Date:   10/12/2021  . CBC with Differential/Platelet    Standing Status:   Future    Standing Expiration Date:   10/12/2021  . IgE    Standing Status:   Future    Standing Expiration Date:   10/12/2021  . Ambulatory Referral for Lung Cancer Scre    Referral Priority:   Routine    Referral Type:   Consultation    Referral Reason:   Specialty Services Required    Number of Visits Requested:   1   Orders Placed This Encounter  Procedures  . Alpha-1 antitrypsin phenotype  . Comp Met (CMET)  . CBC with Differential/Platelet  . IgE  . Ambulatory Referral for Lung Cancer Scre   No orders of the defined types were placed in this encounter.   Follow Up:    Return in about 3 months (around 01/10/2021), or if symptoms worsen or fail to improve, for Follow up  with Dr. Vaughan Browner.   Notification of test results are managed in the following manner: If there are  any recommendations or changes to the  plan of care discussed in office today,  we will contact you and let you know what they are. If you do not hear from [place], then your results are normal and you can view them through your  MyChart account , or a letter will be sent to you. Thank you again for trusting [place] with your care  - Thank you, [facility]    It is flu season:   >>> Best ways to protect herself from the flu: Receive the yearly flu vaccine, practice good hand hygiene washing with soap and also using hand sanitizer when available, eat a nutritious meals, get adequate rest, hydrate appropriately       Please contact  the office if your symptoms worsen or you have concerns that you are not improving.   Thank you for choosing [facility] for your healthcare, and for allowing [place] to partner with you on your healthcare journey. I am thankful to be able to provide care to you today.   Wyn Quaker FNP-C    COPD and Physical Activity Chronic obstructive pulmonary disease (COPD) is a long-term (chronic) condition that affects the lungs. COPD is a general term that can be used to describe many different lung problems that cause lung swelling (inflammation) and limit airflow, including chronic bronchitis and emphysema. The main symptom of COPD is shortness of breath, which makes it harder to do even simple tasks. This can also make it harder to exercise and be active. Talk with your health care provider about treatments to help you breathe better and actions you can take to prevent breathing problems during physical activity. What are the benefits of exercising with COPD? Exercising regularly is an important part of a healthy lifestyle. You can still exercise and do physical activities even though you have COPD. Exercise and physical activity improve your shortness of breath by increasing blood flow (circulation). This causes your heart to pump more oxygen through your body. Moderate exercise can improve your:  Oxygen use.  Energy level.  Shortness of breath.  Strength in your breathing muscles.  Heart health.  Sleep.  Self-esteem and feelings of self-worth.  Depression, stress, and anxiety levels. Exercise can benefit everyone with COPD. The severity of your disease may affect how hard you can exercise, especially at first, but everyone can benefit. Talk with your health care provider about how much exercise is safe for you, and which activities and exercises are safe for you. What actions can I take to prevent breathing problems during physical activity?  Sign up for a pulmonary rehabilitation  program. This type of program may include: ? Education about lung diseases. ? Exercise classes that teach you how to exercise and be more active while improving your breathing. This usually involves:  Exercise using your lower extremities, such as a stationary bicycle.  About 30 minutes of exercise, 2 to 5 times per week, for 6 to 12 weeks  Strength training, such as push ups or leg lifts. ? Nutrition education. ? Group classes in which you can talk with others who also have COPD and learn ways to manage stress.  If you use an oxygen tank, you should use it while you exercise. Work with your health care provider to adjust your oxygen for your physical activity. Your resting flow rate is different from your flow rate during physical  activity.  While you are exercising: ? Take slow breaths. ? Pace yourself and do not try to go too fast. ? Purse your lips while breathing out. Pursing your lips is similar to a kissing or whistling position. ? If doing exercise that uses a quick burst of effort, such as weight lifting:  Breathe in before starting the exercise.  Breathe out during the hardest part of the exercise (such as raising the weights). Where to find support You can find support for exercising with COPD from:  Your health care provider.  A pulmonary rehabilitation program.  Your local health department or community health programs.  Support groups, online or in-person. Your health care provider may be able to recommend support groups. Where to find more information You can find more information about exercising with COPD from:  American Lung Association: ClassInsider.se.  COPD Foundation: https://www.rivera.net/. Contact a health care provider if:  Your symptoms get worse.  You have chest pain.  You have nausea.  You have a fever.  You have trouble talking or catching your breath.  You want to start a new exercise program or a new activity. Summary  COPD is a general term  that can be used to describe many different lung problems that cause lung swelling (inflammation) and limit airflow. This includes chronic bronchitis and emphysema.  Exercise and physical activity improve your shortness of breath by increasing blood flow (circulation). This causes your heart to provide more oxygen to your body.  Contact your health care provider before starting any exercise program or new activity. Ask your health care provider what exercises and activities are safe for you. This information is not intended to replace advice given to you by your health care provider. Make sure you discuss any questions you have with your health care provider. Document Revised: 01/22/2019 Document Reviewed: 10/25/2017 Elsevier Patient Education  [place].   Pneumococcal Vaccine, Polyvalent solution for injection What is this medicine? PNEUMOCOCCAL VACCINE, POLYVALENT (NEU mo KOK al vak SEEN, pol ee VEY luhnt) is a vaccine to prevent pneumococcus bacteria infection. These bacteria are a major cause of ear infections, Strep throat infections, and serious pneumonia, meningitis, or blood infections worldwide. These vaccines help the body to produce antibodies (protective substances) that help your body defend against these bacteria. This vaccine is recommended for people 18 years of age and older with health problems. It is also recommended for all adults over 72 years old. This vaccine will not treat an infection. This medicine may be used for other purposes; ask your health care provider or pharmacist if you have questions. COMMON BRAND NAME(S): Pneumovax 23 What should I tell my health care provider before I take this medicine? They need to know if you have any of these conditions:  bleeding problems  bone marrow or organ transplant  cancer, Hodgkin's disease  fever  infection  immune system problems  low platelet count in the blood  seizures  an unusual or allergic reaction  to pneumococcal vaccine, diphtheria toxoid, other vaccines, latex, other medicines, foods, dyes, or preservatives  pregnant or trying to get pregnant  breast-feeding How should I use this medicine? This vaccine is for injection into a muscle or under the skin. It is given by a health care professional. A copy of Vaccine Information Statements will be given before each vaccination. Read this sheet carefully each time. The sheet may change frequently. Talk to your pediatrician regarding the use of this medicine in children. While this drug may  be prescribed for children as young as 53 years of age for selected conditions, precautions do apply. Overdosage: If you think you have taken too much of this medicine contact a poison control center or emergency room at once. NOTE: This medicine is only for you. Do not share this medicine with others. What if I miss a dose? It is important not to miss your dose. Call your doctor or health care professional if you are unable to keep an appointment. What may interact with this medicine?  medicines for cancer chemotherapy  medicines that suppress your immune function  medicines that treat or prevent blood clots like warfarin, enoxaparin, and dalteparin  steroid medicines like prednisone or cortisone This list may not describe all possible interactions. Give your health care provider a list of all the medicines, herbs, non-prescription drugs, or dietary supplements you use. Also tell them if you smoke, drink alcohol, or use illegal drugs. Some items may interact with your medicine. What should I watch for while using this medicine? Mild fever and pain should go away in 3 days or less. Report any unusual symptoms to your doctor or health care professional. What side effects may I notice from receiving this medicine? Side effects that you should report to your doctor or health care professional as soon as possible:  allergic reactions like skin rash,  itching or hives, swelling of the face, lips, or tongue  breathing problems  confused  fever over 102 degrees F  pain, tingling, numbness in the hands or feet  seizures  unusual bleeding or bruising  unusual muscle weakness Side effects that usually do not require medical attention (report to your doctor or health care professional if they continue or are bothersome):  aches and pains  diarrhea  fever of 102 degrees F or less  headache  irritable  loss of appetite  pain, tender at site where injected  trouble sleeping This list may not describe all possible side effects. Call your doctor for medical advice about side effects. You may report side effects to FDA at 1-800-FDA-1088. Where should I keep my medicine? This does not apply. This vaccine is given in a clinic, pharmacy, doctor's office, or other health care setting and will not be stored at home. NOTE: This sheet is a summary. It may not cover all possible information. If you have questions about this medicine, talk to your doctor, pharmacist, or health care provider.  2020 Elsevier/Gold Standard (2008-05-08 14:32:37)

## 2020-10-12 NOTE — Addendum Note (Signed)
Addended by: Apolonio Schneiders on: 10/12/2020 02:45 PM   Modules accepted: Orders

## 2020-10-12 NOTE — Progress Notes (Signed)
@Patient  ID: , female    DOB: 04/19/1946, 74 y.o.   MRN: [id number]  Chief Complaint  Patient presents with   Follow-up    Pft follow up    Referring provider: [id number], DO  HPI:  74 year old female former smoker followed in our office for COPD  PMH: Vitamin D deficiency, anxiety, hypertension Smoker/ Smoking History: Former smoker.  Quit 2020. 27.5 pack year history.  Maintenance: Trelegy Ellipta Pt of: Dr. 2021  10/12/2020  - Visit   74 year old female former smoker followed in our office for COPD.  Patient was last seen by Dr. 61 for initial consult in September/2021.  Plan of care from that office visit was as follows: Start Trelegy Ellipta, lab work, pulmonary function testing, split-night sleep study, chest x-ray.  Patient reports that her breathing has been much better since starting Trelegy Ellipta 200.  It cost around $100 a month to receive this inhaler.  We will discuss this today.  She is already received her flu vaccination.  She is also received her COVID-19 vaccinations including booster.  It does not appear that she is had a pneumonia vaccine.  We will discuss this today.  She is established with primary care.  We will review her pulmonary function testing today.  She also status post night sleep study.  Questionaires / Pulmonary Flowsheets:   ACT:  No flowsheet data found.  MMRC: mMRC Dyspnea Scale mMRC Score  06/28/2020 1    Epworth:  No flowsheet data found.  Tests:   07/15/2020-CT chest lung cancer screening-lung RADS 2, benign appearance or behavior, continue annual screening with low-dose CT in 12 months, three-vessel coronary arthrosclerosis, small pericardial effusion, aortic arthrosclerosis, emphysema  10/12/2020-pulmonary function test-FVC 1.75 (63% predicted), postbronchodilator ratio 67, postbronchodilator FEV1 1.20 (55% predicted), no bronchodilator response, TLC 4.65 (82% predicted) DLCO 19.94 (90% predicted)    10/05/2020-split-night sleep study-AHI 1.9  FENO:  No results found for: NITRICOXIDE  PFT: PFT Results Latest Ref Rng & Units 10/12/2020  FVC-Pre L 1.75  FVC-Predicted Pre % 63  FVC-Post L 1.79  FVC-Predicted Post % 64  Pre FEV1/FVC % % 63  Post FEV1/FCV % % 67  FEV1-Pre L 1.10  FEV1-Predicted Pre % 51  FEV1-Post L 1.20  DLCO uncorrected ml/min/mmHg 19.94  DLCO UNC% % 90  DLCO corrected ml/min/mmHg 19.94  DLCO COR %Predicted % 90  DLVA Predicted % 120  TLC L 4.65  TLC % Predicted % 82  RV % Predicted % 113    WALK:  No flowsheet data found.  Imaging: LONG TERM MONITOR (3-14 DAYS)  Result Date: 10/05/2020  2 episodes of complete heart block, lasting a total of 15 seconds. Occured at 3:27AM and 2:42AM.  One four beat episode of NSVT  Frequent PVCs (11.5% of beats).  14 days of data recorded on Zio monitor. Patient had a min HR of 28 bpm, max HR of 162 bpm, and avg HR of 67 bpm. Predominant underlying rhythm was Sinus Rhythm. No SVT, atrial fibrillation, or pauses noted. One episode of NSVT lasting 4 beats. 2 episodes of complete heart block, lasting a total of 15 seconds. Frequent PVCs (11.5% of beats). Isolated atrial ectopy was rare (<1%).  There were 2 triggered events, corresponding to sinus rhythm with PVCs and ventricular trigeminy.   SLEEP STUDY DOCUMENTS  Result Date: 10/07/2020 Ordered by an unspecified provider.   Lab Results:  CBC    Component Value Date/Time   WBC 8.2 08/25/2020 1453  WBC 9.7 08/11/2020 1648   RBC 4.60 08/25/2020 1453   HGB 13.3 08/25/2020 1453   HCT 42.8 08/25/2020 1453   PLT 223 08/25/2020 1453   MCV 93.0 08/25/2020 1453   MCH 28.9 08/25/2020 1453   MCHC 31.1 08/25/2020 1453   RDW 14.7 08/25/2020 1453   LYMPHSABS 2.3 08/25/2020 1453   MONOABS 1.1 (H) 08/25/2020 1453   EOSABS 0.2 08/25/2020 1453   BASOSABS 0.0 08/25/2020 1453    BMET    Component Value Date/Time   NA 143 08/31/2020 1513   K 4.7 08/31/2020 1513   CL  106 08/31/2020 1513   CO2 20 08/31/2020 1513   GLUCOSE 82 08/31/2020 1513   GLUCOSE 91 08/25/2020 1453   BUN 33 (H) 08/31/2020 1513   CREATININE 1.52 (H) 08/31/2020 1513   CREATININE 1.79 (H) 08/25/2020 1453   CALCIUM 10.2 08/31/2020 1513   GFRNONAA 34 (L) 08/31/2020 1513   GFRNONAA 30 (L) 08/25/2020 1453   GFRAA 39 (L) 08/31/2020 1513    BNP    Component Value Date/Time   BNP 65.3 08/11/2020 1725    ProBNP No results found for: PROBNP  Specialty Problems      Pulmonary Problems   Asthma-COPD overlap syndrome (HCC)      No Known Allergies  Immunization History  Administered Date(s) Administered   Fluad Quad(high Dose 65+) 07/21/2020   PFIZER SARS-COV-2 Vaccination 12/12/2019, 12/23/2019, 08/23/2020    Past Medical History:  Diagnosis Date   Asthma    Cancer (HCC)    CHF (congestive heart failure) (HCC)    Chronic kidney disease    COPD (chronic obstructive pulmonary disease) (HCC)    Goals of care, counseling/discussion 08/25/2020   Hyperlipidemia    Hypertension    Stage I breast cancer, left ([place]) 08/25/2020   Stroke ([place])     Tobacco History: Social History   Tobacco Use  Smoking Status Former Smoker   Packs/day: 0.50   Years: 55.00   Pack years: 27.50   Types: Cigarettes   Start date: 10/17/1963   Quit date: 01/15/2019   Years since quitting: 1.7  Smokeless Tobacco Never Used   Counseling given: Not Answered   Continue to not smoke  Outpatient Encounter Medications as of 10/12/2020  Medication Sig   albuterol (ACCUNEB) 1.25 MG/3ML nebulizer solution Take 3 mLs (1.25 mg total) by nebulization every 6 (six) hours as needed for wheezing.   albuterol (VENTOLIN HFA) 108 (90 Base) MCG/ACT inhaler Inhale 2 puffs into the lungs every 6 (six) hours as needed. (Patient taking differently: Inhale 2 puffs into the lungs every 6 (six) hours as needed for wheezing.)   amLODipine (NORVASC) 10 MG tablet Take 1 tablet (10 mg total) by  mouth daily.   anastrozole (ARIMIDEX) 1 MG tablet Take 1 tablet (1 mg total) by mouth daily.   aspirin EC 81 MG EC tablet Take 1 tablet (81 mg total) by mouth daily. Swallow whole.   atorvastatin (LIPITOR) 80 MG tablet Take 80 mg by mouth daily.   carvedilol (COREG) 25 MG tablet Take 1 tablet (25 mg total) by mouth 2 (two) times daily.   cloNIDine (CATAPRES) 0.2 MG tablet Take 1 tablet (0.2 mg total) by mouth 2 (two) times daily.   diltiazem (DILACOR XR) 240 MG 24 hr capsule Take 240 mg by mouth daily.   doxazosin (CARDURA) 1 MG tablet Take 1 tablet (1 mg total) by mouth 2 (two) times daily.   Fluticasone-Umeclidin-Vilant (TRELEGY ELLIPTA) 200-62.5-25 MCG/INH AEPB Inhale 1  puff into the lungs daily.   hydrochlorothiazide (HYDRODIURIL) 25 MG tablet Take 25 mg by mouth daily.   sertraline (ZOLOFT) 25 MG tablet Take 25 mg by mouth daily.   traZODone (DESYREL) 50 MG tablet Take 1 tablet (50 mg total) by mouth at bedtime as needed for sleep.   Vitamin D, Ergocalciferol, (DRISDOL) 1.25 MG (50000 UNIT) CAPS capsule Take 1 capsule (50,000 Units total) by mouth every 7 (seven) days.   Facility-Administered Encounter Medications as of 10/12/2020  Medication   nitroGLYCERIN (NITROSTAT) SL tablet 0.4 mg     Review of Systems  Review of Systems  Constitutional: Negative for activity change, fatigue and fever.  HENT: Negative for congestion, sinus pressure, sinus pain and sore throat.   Respiratory: Positive for shortness of breath. Negative for cough and wheezing.   Cardiovascular: Negative for chest pain and palpitations.  Gastrointestinal: Negative for diarrhea, nausea and vomiting.  Musculoskeletal: Negative for arthralgias.  Neurological: Negative for dizziness.  Psychiatric/Behavioral: Negative for sleep disturbance. The patient is not nervous/anxious.      Physical Exam  BP 124/68 (BP Location: Left Arm, Patient Position: Sitting, Cuff Size: Normal)    Pulse 63    Temp 97.6  F (36.4 C)    Ht 5\' 8"  (1.727 m)    Wt 232 lb 6.4 oz (105.4 kg)    SpO2 96%    BMI 35.34 kg/m   Wt Readings from Last 5 Encounters:  10/12/20 232 lb 6.4 oz (105.4 kg)  10/05/20 234 lb (106.1 kg)  08/25/20 232 lb 6.4 oz (105.4 kg)  08/24/20 232 lb (105.2 kg)  08/20/20 234 lb 3.2 oz (106.2 kg)    BMI Readings from Last 5 Encounters:  10/12/20 35.34 kg/m  10/05/20 35.58 kg/m  08/25/20 35.34 kg/m  08/24/20 35.28 kg/m  08/20/20 35.61 kg/m     Physical Exam Vitals and nursing note reviewed.  Constitutional:      General: She is not in acute distress.    Appearance: Normal appearance. She is normal weight.  HENT:     Head: Normocephalic and atraumatic.     Right Ear: External ear normal.     Left Ear: External ear normal.     Nose: Nose normal. No congestion or rhinorrhea.     Mouth/Throat:     Mouth: Mucous membranes are moist.     Pharynx: Oropharynx is clear.  Eyes:     Pupils: Pupils are equal, round, and reactive to light.  Cardiovascular:     Rate and Rhythm: Normal rate and regular rhythm.     Pulses: Normal pulses.     Heart sounds: Normal heart sounds. No murmur heard.   Pulmonary:     Effort: Pulmonary effort is normal. No respiratory distress.     Breath sounds: No decreased air movement. No decreased breath sounds, wheezing or rales.  Musculoskeletal:     Cervical back: Normal range of motion.  Skin:    General: Skin is warm and dry.     Capillary Refill: Capillary refill takes less than 2 seconds.  Neurological:     General: No focal deficit present.     Mental Status: She is alert and oriented to person, place, and time. Mental status is at baseline.     Gait: Gait normal.  Psychiatric:        Mood and Affect: Mood normal.        Behavior: Behavior normal.        Thought Content: Thought content normal.  Judgment: Judgment normal.       Assessment & Plan:   Asthma-COPD overlap syndrome (HCC) Plan: Continue Trelegy Ellipta 200 Lab  work today Pneumovax 23 today Follow-up in 3 months  Former smoker Plan: Referral to lung cancer screening Continue not smoke Pneumovax 23  Healthcare maintenance Plan: Pneumovax 23 today    Return in about 3 months (around 01/10/2021), or if symptoms worsen or fail to improve, for Follow up with Dr. Vaughan Browner.   Lauraine Rinne, NP 10/12/2020   This appointment required 34 minutes of patient care (this includes precharting, chart review, review of results, face-to-face care, etc.).

## 2020-10-12 NOTE — Progress Notes (Signed)
PFT done today. 

## 2020-10-12 NOTE — Addendum Note (Signed)
Addended by: Demetrio Lapping E on: 10/12/2020 02:39 PM   Modules accepted: Orders

## 2020-10-13 ENCOUNTER — Encounter (HOSPITAL_BASED_OUTPATIENT_CLINIC_OR_DEPARTMENT_OTHER): Payer: Self-pay | Admitting: Cardiovascular Disease

## 2020-10-13 NOTE — Procedures (Signed)
Patient Name: Jessica [name], Jessica [name] Date: 10/05/2020 Gender: Female D.O.B: 1946-09-16 Age (years): 74 Referring Provider: Epifanio Lesches Height (inches): 68 Interpreting Physician: Nicki Guadalajara MD, ABSM Weight (lbs): 234 RPSGT: Ulyess Mort BMI: 36 MRN: [id number] Neck Size: 15.50  CLINICAL INFORMATION Sleep Study Type: NPSG  Indication for sleep study: COPD, Excessive Daytime Sleepiness, Fatigue, Hypertension, Obesity, OSA, Snoring  Epworth Sleepiness Score: 11  SLEEP STUDY TECHNIQUE As per the AASM Manual for the Scoring of Sleep and Associated Events v2.3 (April 2016) with a hypopnea requiring 4% desaturations.  The channels recorded and monitored were frontal, central and occipital EEG, electrooculogram (EOG), submentalis EMG (chin), nasal and oral airflow, thoracic and abdominal wall motion, anterior tibialis EMG, snore microphone, electrocardiogram, and pulse oximetry.  MEDICATIONS albuterol (ACCUNEB) 1.25 MG/3ML nebulizer solution albuterol (VENTOLIN HFA) 108 (90 Base) MCG/ACT inhaler amLODipine (NORVASC) 10 MG tablet anastrozole (ARIMIDEX) 1 MG tablet aspirin EC 81 MG EC tablet atorvastatin (LIPITOR) 80 MG tablet carvedilol (COREG) 25 MG tablet cloNIDine (CATAPRES) 0.2 MG tablet diltiazem (DILACOR XR) 240 MG 24 hr capsule doxazosin (CARDURA) 1 MG tablet Fluticasone-Umeclidin-Vilant (TRELEGY ELLIPTA) 200-62.5-25 MCG/INH AEPB Fluticasone-Umeclidin-Vilant (TRELEGY ELLIPTA) 200-62.5-25 MCG/INH AEPB hydrochlorothiazide (HYDRODIURIL) 25 MG tablet sertraline (ZOLOFT) 25 MG tablet traZODone (DESYREL) 50 MG tablet Vitamin D, Ergocalciferol, (DRISDOL) 1.25 MG (50000 UNIT) CAPS capsule Medications self-administered by patient taken the night of the study : TRAZODONE  SLEEP ARCHITECTURE The study was initiated at 10:19:11 PM and ended at 4:47:37 AM.  Sleep onset time was 62.2 minutes and the sleep efficiency was 81.6%%. The total sleep time was 317  minutes.  Stage REM latency was 125.0 minutes.  The patient spent 10.7%% of the night in stage N1 sleep, 78.5%% in stage N2 sleep, 0.0%% in stage N3 and 10.7% in REM.  Alpha intrusion was absent.  Supine sleep was 38.49%.  RESPIRATORY PARAMETERS The overall apnea/hypopnea index (AHI) was 1.9 per hour.  The respiratory disturbance index (RDI) was 19.9/h. There were 4 total apneas, including 4 obstructive, 0 central and 0 mixed apneas. There were 6 hypopneas and 95 RERAs.  The AHI during Stage REM sleep was 5.3 per hour.  AHI while supine was 0.5 per hour.  The mean oxygen saturation was 95.0%. The minimum SpO2 during sleep was 84.0%.  Loud snoring was noted during this study.  CARDIAC DATA The 2 lead EKG demonstrated sinus rhythm. The mean heart rate was 62.5 beats per minute. Other EKG findings include: PVCs.  LEG MOVEMENT DATA The total PLMS were 0 with a resulting PLMS index of 0.0. Associated arousal with leg movement index was 0.0 .  IMPRESSIONS - Increased upper airway resistance syndrome (UARS) without significant obstructive sleep apnea overall  (AHI 1.9/h; however RDI 19.9/h). There is mild sleep apnea during REM sleep (AHI 5.3/h) - No significant central sleep apnea occurred during this study (CAI = 0.0/h). - Mild oxygen desaturation to a nadir of 84.0%.  Supplemental O2 at 1LPM was initiated due to oxygen levels below 88% in the absence of events. - The patient snored with loud snoring volume. - EKG findings include PVCs. - Clinically significant periodic limb movements did not occur during sleep. No significant associated arousals.  DIAGNOSIS - Upper Airway Resistance Syndrome (UARS)  - Sleep Apnea, unspecified G47.30 - Nocturnal Hypoxemia (G47.36)  RECOMMENDATIONS - Effort should be made to optimize nasal and oropharyngeal patency, - At present, patient does not meet criteria for CPAP. - Consider alternatives for loud snoring. - Consider overnight oximetry  evaluation and  need for supplemental oxygen. - Avoid alcohol, sedatives and other CNS depressants that may worsen sleep apnea and disrupt normal sleep architecture. - Sleep hygiene should be reviewed to assess factors that may improve sleep quality. - Weight management (BMI 36) and regular exercise should be initiated or continued if appropriate.  [Electronically signed] 10/13/2020 06:29 PM  Nicki Guadalajara MD, [facility], ABSM Diplomate, American Board of Sleep Medicine   NPI: [id number] [facility] PH: [phone]   FX: [phone] ACCREDITED BY THE AMERICAN ACADEMY OF SLEEP MEDICINE

## 2020-10-17 NOTE — Progress Notes (Signed)
Cardiology Office Note:    Date:  10/18/2020   ID:  Marliss Coots, DOB 1946-03-25, MRN [id number]  PCP:  Ronnald Nian, DO  Cardiologist:  Donato Heinz, MD  Electrophysiologist:  None   Referring MD: Ronnald Nian, DO   Chief Complaint  Patient presents with  . Follow-up    Follow-up shortness of breath   History of Present Illness:    Jessica [name] is a 75 y.o. female with a hx of CVA, COPD, hypertension, hyperlipidemia, CKD, breast cancer, CAD status post PCI who presents for follow-up.  She was initially seen on 08/11/2020.  She moved to [place] from [place] in July 2021.  Reports a history of CAD status post MI in stent x2.  Reports first MI was in 2010, had stent at [facility].  Second stent was DES to LAD in 2017.  At initial clinic visit on 08/11/2020.  She reported having chest pain and BP was markedly elevated.  It was recommended that she go to the ED and she was admitted to [facility] from 10/27 through 08/17/2020.  Troponins were negative.  She initially required nicardipine drip for hypertension.  Work-up of secondary hypertension included renal artery duplex showing bilateral renal artery stenosis 1 to 59%, normal TSH, elevated aldosterone/renin ratio (43), elevated metanephrines.  Referred to endocrinology for further evaluation.  Echocardiogram on 08/13/2020 showed normal biventricular function, no significant valvular disease.  Zio patch x14 days on 10/04/2020 showed 2 episodes of complete heart block, longest lasting 15 seconds, both occurring at night, frequent PVCs (11.5% of beats), one 4 beat episode of NSVT.  Since last clinic visit, she reports that she has been doing okay.  States that BP has been under good control, was 106/61 yesterday and 111/63 today.  Does report she had an episode of lightheadedness this morning where she felt like she was going to pass out.  States that she was lying in bed and had sudden onset of lightheadedness, lasted  for about 2 minutes and resolved.  She otherwise denies any chest pain, dyspnea, lower extremity edema, or palpitations.   Past Medical History:  Diagnosis Date  . Asthma   . Cancer ([place])   . CHF (congestive heart failure) ([place])   . Chronic kidney disease   . COPD (chronic obstructive pulmonary disease) ([place])   . Goals of care, counseling/discussion 08/25/2020  . Hyperlipidemia   . Hypertension   . Stage I breast cancer, left ([place]) 08/25/2020  . Stroke [facility])     Past Surgical History:  Procedure Laterality Date  . stomach surgery  1990    Current Medications: Current Meds  Medication Sig  . albuterol (ACCUNEB) 1.25 MG/3ML nebulizer solution Take 3 mLs (1.25 mg total) by nebulization every 6 (six) hours as needed for wheezing.  Marland Kitchen albuterol (VENTOLIN HFA) 108 (90 Base) MCG/ACT inhaler Inhale 2 puffs into the lungs every 6 (six) hours as needed. (Patient taking differently: Inhale 2 puffs into the lungs every 6 (six) hours as needed for wheezing.)  . amLODipine (NORVASC) 10 MG tablet Take 1 tablet (10 mg total) by mouth daily.  Marland Kitchen anastrozole (ARIMIDEX) 1 MG tablet Take 1 tablet (1 mg total) by mouth daily.  Marland Kitchen aspirin EC 81 MG EC tablet Take 1 tablet (81 mg total) by mouth daily. Swallow whole.  Marland Kitchen atorvastatin (LIPITOR) 80 MG tablet Take 80 mg by mouth daily.  . carvedilol (COREG) 25 MG tablet Take 1 tablet (25 mg total) by mouth 2 (two) times daily.  Marland Kitchen  cloNIDine (CATAPRES) 0.2 MG tablet Take 1 tablet (0.2 mg total) by mouth 2 (two) times daily.  Marland Kitchen doxazosin (CARDURA) 1 MG tablet Take 1 tablet (1 mg total) by mouth 2 (two) times daily.  . Fluticasone-Umeclidin-Vilant (TRELEGY ELLIPTA) 200-62.5-25 MCG/INH AEPB Inhale 1 puff into the lungs daily.  . Fluticasone-Umeclidin-Vilant (TRELEGY ELLIPTA) 200-62.5-25 MCG/INH AEPB Inhale 1 puff into the lungs daily.  . hydrochlorothiazide (HYDRODIURIL) 25 MG tablet Take 25 mg by mouth daily.  . sertraline (ZOLOFT) 25 MG tablet Take 25 mg by mouth  daily.  . traZODone (DESYREL) 50 MG tablet Take 1 tablet (50 mg total) by mouth at bedtime as needed for sleep.  . Vitamin D, Ergocalciferol, (DRISDOL) 1.25 MG (50000 UNIT) CAPS capsule Take 1 capsule (50,000 Units total) by mouth every 7 (seven) days.  . [DISCONTINUED] diltiazem (DILACOR XR) 240 MG 24 hr capsule Take 240 mg by mouth daily.   Current Facility-Administered Medications for the 10/18/20 encounter (Office Visit) with Little Ishikawa, MD  Medication  . nitroGLYCERIN (NITROSTAT) SL tablet 0.4 mg     Allergies:   Patient has no known allergies.   Social History   Socioeconomic History  . Marital status: Widowed    Spouse name: Not on file  . Number of children: Not on file  . Years of education: Not on file  . Highest education level: Not on file  Occupational History  . Not on file  Tobacco Use  . Smoking status: Former Smoker    Packs/day: 0.50    Years: 55.00    Pack years: 27.50    Types: Cigarettes    Start date: 10/17/1963    Quit date: 01/15/2019    Years since quitting: 1.7  . Smokeless tobacco: Never Used  Vaping Use  . Vaping Use: Never used  Substance and Sexual Activity  . Alcohol use: Never  . Drug use: Never  . Sexual activity: Not Currently  Other Topics Concern  . Not on file  Social History Narrative  . Not on file   Social Determinants of Health   Financial Resource Strain: Not on file  Food Insecurity: Not on file  Transportation Needs: Not on file  Physical Activity: Not on file  Stress: Not on file  Social Connections: Not on file     Family History: The patient's family history includes Heart disease in her maternal aunt and mother; Hyperlipidemia in her maternal aunt and mother; Stroke in her maternal aunt and mother.  ROS:   Please see the history of present illness.     All other systems reviewed and are negative.  EKGs/Labs/Other Studies Reviewed:    The following studies were reviewed today:   EKG:  EKG is   ordered today.  The ekg ordered today demonstrates normal sinus rhythm, frequent PVCs, poor R wave progression  Recent Labs: 08/11/2020: B Natriuretic Peptide 65.3 08/12/2020: TSH 1.693 08/14/2020: Magnesium 1.9 10/12/2020: ALT 12; BUN 37; Creatinine, Ser 1.56; Hemoglobin 13.8; Platelets 173.0; Potassium 3.6; Sodium 140  Recent Lipid Panel    Component Value Date/Time   CHOL 128 08/12/2020 0505   TRIG 95 08/12/2020 0505   HDL 45 08/12/2020 0505   CHOLHDL 2.8 08/12/2020 0505   VLDL 19 08/12/2020 0505   LDLCALC 64 08/12/2020 0505    Physical Exam:    VS:  BP 110/70 (BP Location: Left Arm, Patient Position: Sitting)   Pulse 67   Ht 5\' 9"  (1.753 m)   Wt 229 lb 6.4 oz (104.1 kg)  SpO2 93%   BMI 33.88 kg/m     Wt Readings from Last 3 Encounters:  10/18/20 229 lb 6.4 oz (104.1 kg)  10/12/20 232 lb 6.4 oz (105.4 kg)  10/05/20 234 lb (106.1 kg)     GEN:  in no acute distress HEENT: Normal NECK: No JVD; No carotid bruits CARDIAC: RRR, no murmurs, rubs, gallops RESPIRATORY:  Clear to auscultation without rales, wheezing or rhonchi  ABDOMEN: Soft, non-tender, non-distended MUSCULOSKELETAL:  No edema; No deformity  SKIN: Warm and dry NEUROLOGIC:  Alert and oriented x 3 PSYCHIATRIC:  Normal affect   ASSESSMENT:    1. Essential hypertension   2. Frequent PVCs   3. CHB (complete heart block) (HCC)   4. Coronary artery disease involving native coronary artery of native heart without angina pectoris   5. Nocturnal hypoxemia    PLAN:     Hypertension: On carvedilol 25 mg twice daily, clonidine 0.2 mg twice daily, Cardizem 240 mg daily, doxazosin 4 mg daily, amlodipine 10 mg daily, hydrochlorothiazide 25 mg daily. Work-up of secondary hypertension included renal artery duplex showing bilateral renal artery stenosis 1 to 59%, normal TSH, sleep study with mild OSA not meeting criteria for CPAP, elevated aldosterone/renin ratio (43), elevated metanephrines.  Referred to  endocrinology for further evaluation.  -BP appears well controlled but she is on 2 calcium channel blockers for unclear reasons, is on amlodipine and Cardizem.  Also had episode of transient heart block on monitor, recommend discontinuing Cardizem. -She has HCTZ on her med list and states that she is taking, but unclear when this was prescribed.  Recommend appointment in pharmacy clinic in 2 weeks and bring all medications with her to confirm what she is taking.  Asked her to monitor her BP daily for next 2 weeks and bring log to appointment -Will check BMP, magnesium  Frequent PVCs/complete heart block:  Zio patch x14 days on 10/04/2020 showed 2 episodes of complete heart block, lasted 15 seconds, both occurring at night, frequent PVCs (11.5% of beats), one 4 beat episode of NSVT.  Did report episode of sudden lightheadedness/near syncope this morning that is concerning for symptomatic bradycardia -PVC burden 12% of beats despite coreg and diltiazem, but also with short episodes of what appears to be complete heart block.  Will discontinue diltiazem as above.  EP referral for further evaluation  CAD: Status post MI and stent x2, last in 2017.   -Continue aspirin 81 mg daily, atorvastatin 80 mg daily  CVA: on aspirin, statin  Hyperlipidemia: LDL 64 on 08/12/2020.  On atorvastatin 80 mg daily  CKD stage III: Creatinine 1.6 on 10/12/2020.  Follows with nephrology  OSA: Diagnosed in 2012, reports was never started on CPAP.  Sleep study 10/05/20 with mild OSA not meeting criteria for CPAP, along with overnight desaturations.  Will check overnight oximetry study to evaluate for need for home O2.   RTC in 2 months  Medication Adjustments/Labs and Tests Ordered: Current medicines are reviewed at length with the patient today.  Concerns regarding medicines are outlined above.  Orders Placed This Encounter  Procedures  . Basic metabolic panel  . Magnesium  . Ambulatory referral to Cardiac  Electrophysiology  . Pulse oximetry, overnight  . EKG 12-Lead   No orders of the defined types were placed in this encounter.   Patient Instructions  Medication Instructions:  STOP diltiazem  *If you need a refill on your cardiac medications before your next appointment, please call your pharmacy*   Lab Work:  BMET, Mag today  If you have labs (blood work) drawn today and your tests are completely normal, you will receive your results only by: Marland Kitchen MyChart Message (if you have MyChart) OR . A paper copy in the mail If you have any lab test that is abnormal or we need to change your treatment, we will call you to review the results.   Testing/Procedures: Overnight oximetry test- we will fax orders to [place]. They will reach out to you to get this arranged.  Follow-Up: At [facility], you and your health needs are our priority.  As part of our continuing mission to provide you with exceptional heart care, we have created designated Provider Care Teams.  These Care Teams include your primary Cardiologist (physician) and Advanced Practice Providers (APPs -  Physician Assistants and Nurse Practitioners) who all work together to provide you with the care you need, when you need it.  We recommend signing up for the patient portal called "MyChart".  Sign up information is provided on this After Visit Summary.  MyChart is used to connect with patients for Virtual Visits (Telemedicine).  Patients are able to view lab/test results, encounter notes, upcoming appointments, etc.  Non-urgent messages can be sent to your provider as well.   To learn more about what you can do with MyChart, go to [email or web address].    Your next appointment:   2 weeks with pharmacist (BRING ALL MEDICATIONS AND BP LOG) 2 months with Dr. Gardiner Rhyme   Other Instructions You have been referred to: Electrophysiologist, we will call to arrange this appointment  Please check your blood pressure at home daily,  write it down.  Bring your log to your appointment with pharmacist.      Signed, Donato Heinz, MD  10/18/2020 1:12 PM    [place]

## 2020-10-18 ENCOUNTER — Other Ambulatory Visit: Payer: Self-pay | Admitting: *Deleted

## 2020-10-18 ENCOUNTER — Other Ambulatory Visit: Payer: Self-pay

## 2020-10-18 ENCOUNTER — Encounter: Payer: Self-pay | Admitting: Cardiology

## 2020-10-18 ENCOUNTER — Ambulatory Visit: Payer: Medicare Other | Admitting: Cardiology

## 2020-10-18 VITALS — BP 110/70 | HR 67 | Ht 69.0 in | Wt 229.4 lb

## 2020-10-18 DIAGNOSIS — I493 Ventricular premature depolarization: Secondary | ICD-10-CM | POA: Diagnosis not present

## 2020-10-18 DIAGNOSIS — I442 Atrioventricular block, complete: Secondary | ICD-10-CM

## 2020-10-18 DIAGNOSIS — I2510 Atherosclerotic heart disease of native coronary artery without angina pectoris: Secondary | ICD-10-CM | POA: Diagnosis not present

## 2020-10-18 DIAGNOSIS — I10 Essential (primary) hypertension: Secondary | ICD-10-CM

## 2020-10-18 DIAGNOSIS — G4734 Idiopathic sleep related nonobstructive alveolar hypoventilation: Secondary | ICD-10-CM

## 2020-10-18 LAB — BASIC METABOLIC PANEL
BUN/Creatinine Ratio: 17 (ref 12–28)
BUN: 23 mg/dL (ref 8–27)
CO2: 30 mmol/L — ABNORMAL HIGH (ref 20–29)
Calcium: 10.2 mg/dL (ref 8.7–10.3)
Chloride: 103 mmol/L (ref 96–106)
Creatinine, Ser: 1.34 mg/dL — ABNORMAL HIGH (ref 0.57–1.00)
GFR calc Af Amer: 46 mL/min/{1.73_m2} — ABNORMAL LOW (ref >59)
GFR calc non Af Amer: 40 mL/min/{1.73_m2} — ABNORMAL LOW (ref >59)
Glucose: 108 mg/dL — ABNORMAL HIGH (ref 65–99)
Potassium: 4.1 mmol/L (ref 3.5–5.2)
Sodium: 144 mmol/L (ref 134–144)

## 2020-10-18 LAB — MAGNESIUM: Magnesium: 2.1 mg/dL (ref 1.6–2.3)

## 2020-10-18 NOTE — Patient Instructions (Addendum)
Medication Instructions:  STOP diltiazem  *If you need a refill on your cardiac medications before your next appointment, please call your pharmacy*   Lab Work: BMET, Mag today  If you have labs (blood work) drawn today and your tests are completely normal, you will receive your results only by: Marland Kitchen MyChart Message (if you have MyChart) OR . A paper copy in the mail If you have any lab test that is abnormal or we need to change your treatment, we will call you to review the results.   Testing/Procedures: Overnight oximetry test- we will fax orders to [facility]. They will reach out to you to get this arranged.  Follow-Up: At [facility], you and your health needs are our priority.  As part of our continuing mission to provide you with exceptional heart care, we have created designated Provider Care Teams.  These Care Teams include your primary Cardiologist (physician) and Advanced Practice Providers (APPs -  Physician Assistants and Nurse Practitioners) who all work together to provide you with the care you need, when you need it.  We recommend signing up for the patient portal called "MyChart".  Sign up information is provided on this After Visit Summary.  MyChart is used to connect with patients for Virtual Visits (Telemedicine).  Patients are able to view lab/test results, encounter notes, upcoming appointments, etc.  Non-urgent messages can be sent to your provider as well.   To learn more about what you can do with MyChart, go to [email or web address].    Your next appointment:   2 weeks with pharmacist (BRING ALL MEDICATIONS AND BP LOG) 2 months with Dr. Bjorn Pippin   Other Instructions You have been referred to: Electrophysiologist, we will call to arrange this appointment  Please check your blood pressure at home daily, write it down.  Bring your log to your appointment with pharmacist.

## 2020-10-19 ENCOUNTER — Telehealth: Payer: Self-pay

## 2020-10-19 ENCOUNTER — Encounter: Payer: Self-pay | Admitting: Internal Medicine

## 2020-10-19 ENCOUNTER — Ambulatory Visit: Payer: Medicare Other | Admitting: Internal Medicine

## 2020-10-19 DIAGNOSIS — I493 Ventricular premature depolarization: Secondary | ICD-10-CM | POA: Diagnosis not present

## 2020-10-19 NOTE — Progress Notes (Signed)
HPI Jessica [name] is referred by Dr. Darryl [name] for evaluation of PVC's and heart block.  She is a pleasant 75 yo woman with HTN, obesity, COPD, and dyslipidemia. She has a h/o CAD, s/p MI in 2010 and 2017. She has a had a h/o untreated HTN and preserved LV function. She has worn a cardiac monitor demonstrated 11% PVC's and nocturnal heart block. She is asmptomatic. She feels neither her PVC's and appears to be sleeping during periods of heart block. She does not have any h/o dizziness or syncope.  No Known Allergies   Current Outpatient Medications  Medication Sig Dispense Refill  . albuterol (ACCUNEB) 1.25 MG/3ML nebulizer solution Take 3 mLs (1.25 mg total) by nebulization every 6 (six) hours as needed for wheezing. 75 mL 3  . albuterol (VENTOLIN HFA) 108 (90 Base) MCG/ACT inhaler Inhale 2 puffs into the lungs every 6 (six) hours as needed. (Patient taking differently: Inhale 2 puffs into the lungs every 6 (six) hours as needed for wheezing.) 1 each 3  . amLODipine (NORVASC) 10 MG tablet Take 1 tablet (10 mg total) by mouth daily. 90 tablet 3  . anastrozole (ARIMIDEX) 1 MG tablet Take 1 tablet (1 mg total) by mouth daily. 90 tablet 3  . aspirin EC 81 MG EC tablet Take 1 tablet (81 mg total) by mouth daily. Swallow whole. 90 tablet 0  . atorvastatin (LIPITOR) 80 MG tablet Take 80 mg by mouth daily.    . carvedilol (COREG) 25 MG tablet Take 1 tablet (25 mg total) by mouth 2 (two) times daily. 180 tablet 3  . cloNIDine (CATAPRES) 0.2 MG tablet Take 1 tablet (0.2 mg total) by mouth 2 (two) times daily. 180 tablet 3  . doxazosin (CARDURA) 1 MG tablet Take 1 tablet (1 mg total) by mouth 2 (two) times daily. 180 tablet 1  . fluticasone (FLOVENT HFA) 110 MCG/ACT inhaler 1 puff    . Fluticasone-Umeclidin-Vilant (TRELEGY ELLIPTA) 200-62.5-25 MCG/INH AEPB Inhale 1 puff into the lungs daily. 60 each 11  . Fluticasone-Umeclidin-Vilant (TRELEGY ELLIPTA) 200-62.5-25 MCG/INH AEPB Inhale 1 puff into the  lungs daily. 28 each 0  . sertraline (ZOLOFT) 25 MG tablet Take 25 mg by mouth daily.    . traZODone (DESYREL) 50 MG tablet Take 1 tablet (50 mg total) by mouth at bedtime as needed for sleep. 30 tablet 1  . Vitamin D, Ergocalciferol, (DRISDOL) 1.25 MG (50000 UNIT) CAPS capsule Take 1 capsule (50,000 Units total) by mouth every 7 (seven) days. 5 capsule 3   Current Facility-Administered Medications  Medication Dose Route Frequency Provider Last Rate Last Admin  . nitroGLYCERIN (NITROSTAT) SL tablet 0.4 mg  0.4 mg Sublingual Q5 min PRN Jessica Ishikawa, MD   0.4 mg at 08/11/20 1610     Past Medical History:  Diagnosis Date  . Asthma   . Cancer (HCC)   . CHF (congestive heart failure) (HCC)   . Chronic kidney disease   . COPD (chronic obstructive pulmonary disease) (HCC)   . Goals of care, counseling/discussion 08/25/2020  . Hyperlipidemia   . Hypertension   . Stage I breast cancer, left (HCC) 08/25/2020  . Stroke (HCC)     ROS:   All systems reviewed and negative except as noted in the HPI.   Past Surgical History:  Procedure Laterality Date  . stomach surgery  1990     Family History  Problem Relation Age of Onset  . Stroke Mother   . Heart disease  Mother   . Hyperlipidemia Mother   . Heart disease Maternal Aunt   . Hyperlipidemia Maternal Aunt   . Stroke Maternal Aunt      Social History   Socioeconomic History  . Marital status: Widowed    Spouse name: Not on file  . Number of children: Not on file  . Years of education: Not on file  . Highest education level: Not on file  Occupational History  . Not on file  Tobacco Use  . Smoking status: Former Smoker    Packs/day: 0.50    Years: 55.00    Pack years: 27.50    Types: Cigarettes    Start date: 10/17/1963    Quit date: 01/15/2019    Years since quitting: 1.7  . Smokeless tobacco: Never Used  Vaping Use  . Vaping Use: Never used  Substance and Sexual Activity  . Alcohol use: Never  . Drug use:  Never  . Sexual activity: Not Currently  Other Topics Concern  . Not on file  Social History Narrative  . Not on file   Social Determinants of Health   Financial Resource Strain: Not on file  Food Insecurity: Not on file  Transportation Needs: Not on file  Physical Activity: Not on file  Stress: Not on file  Social Connections: Not on file  Intimate Partner Violence: Not on file     BP 128/80   Pulse 64   Ht 5\' 9"  (1.753 m)   Wt 230 lb (104.3 kg)   SpO2 95%   BMI 33.97 kg/m   Physical Exam:  Well appearing NAD HEENT: Unremarkable Neck:  No JVD, no thyromegally Lymphatics:  No adenopathy Back:  No CVA tenderness Lungs:  Clear HEART:  Regular rate rhythm, no murmurs, no rubs, no clicks Abd:  soft, positive bowel sounds, no organomegally, no rebound, no guarding Ext:  2 plus pulses, no edema, no cyanosis, no clubbing Skin:  No rashes no nodules Neuro:  CN II through XII intact, motor grossly intact  EKG - reivewed NSR with PVC's  Assess/Plan: 1. PVC's - at this point she appears to be asymptomatic and her PVC burden is not sufficiently dense for me to recommend an AA drug nor ablation. I would suggest watchful waiting. 2. Nocturnal bradycardia - she did not have much in the way of symptoms at it appears she is sleeping when she has the pauses. I discussed the symptoms she might experience if her heart block were to worsen. She remains on a pretty good dose of coreg but her CAD and HTN would suggest she stay on this despite nocturnal bradycardia. 3. HTN - as above. She is fairly well controlled and will continue her current meds. 4. Obesity - I discussed the importance of weight loss and asked her to lose weight.   Jessica Overlie [name],MD

## 2020-10-19 NOTE — Telephone Encounter (Signed)
Left message to call back for sleep study results.  

## 2020-10-19 NOTE — Telephone Encounter (Signed)
-----   Message from Thomas A Kelly, MD sent at 10/13/2020  6:34 PM EST ----- Nina, please notify pt of results 

## 2020-10-19 NOTE — Patient Instructions (Addendum)
Medication Instructions:  Your physician recommends that you continue on your current medications as directed. Please refer to the Current Medication list given to you today.  Labwork: None ordered.  Testing/Procedures: None ordered.  Follow-Up: Your physician wants you to follow-up in: as needed with Dr. Taylor.      Any Other Special Instructions Will Be Listed Below (If Applicable).  If you need a refill on your cardiac medications before your next appointment, please call your pharmacy.   

## 2020-10-22 LAB — IGE: IgE (Immunoglobulin E), Serum: 1648 kU/L — ABNORMAL HIGH (ref <=114)

## 2020-10-22 LAB — ALPHA-1 ANTITRYPSIN PHENOTYPE: A-1 Antitrypsin, Ser: 129 mg/dL (ref 83–199)

## 2020-10-26 ENCOUNTER — Telehealth: Payer: Self-pay | Admitting: *Deleted

## 2020-10-26 NOTE — Telephone Encounter (Addendum)
Informed patient of sleep study results and patient understanding was verbalized. Patient understands her sleep study showed   IMPRESSIONS - Increased upper airway resistance syndrome (UARS) without significant obstructive sleep apnea overall  (AHI 1.9/h; however RDI 19.9/h). There is mild sleep apnea during REM sleep (AHI 5.3/h) - Mild oxygen desaturation to a nadir of 84.0%.  Supplemental O2 at 1LPM was initiated due to oxygen levels below 88% in the absence of events. - The patient snored with loud snoring volume. - EKG findings include PVCs.  DIAGNOSIS - Upper Airway Resistance Syndrome (UARS)  - Sleep Apnea, unspecified G47.30 - Nocturnal Hypoxemia (G47.36)  RECOMMENDATIONS - Effort should be made to optimize nasal and oropharyngeal patency, - At present, patient does not meet criteria for CPAP.  PER DPR Left detailed message on voicemail and informed patient to call back with questions.

## 2020-10-26 NOTE — Telephone Encounter (Signed)
-----   Message from Troy Sine, MD sent at 10/13/2020  6:34 PM EST ----- Gae Bon, please notify pt of results

## 2020-10-28 ENCOUNTER — Telehealth: Payer: Self-pay | Admitting: Pulmonary Disease

## 2020-10-28 NOTE — Telephone Encounter (Signed)
10/28/2020  We had previously contacted the patient regarding her lab results.  At that time it was recommended that she have a earlier follow-up with Dr. Vaughan Browner.  This is not yet been coordinated by the patient.  Would recommend the patient move up March/2022 office visit with Dr. Vaughan Browner to February/2022.  Can we please attempt to try to get this coordinated again.  Wyn Quaker, FNP

## 2020-10-28 NOTE — Telephone Encounter (Signed)
I tried to call patient but there was no answer, left VM.

## 2020-10-29 NOTE — Telephone Encounter (Signed)
I called and spoke with patient nephew about moving up appt. He stated the patient wished to keep that day since she already has transportation for that day even though Aaron Edelman wanting to move it closer. He verbalized understanding. Nothing further needed.

## 2020-11-09 ENCOUNTER — Other Ambulatory Visit: Payer: Self-pay

## 2020-11-09 ENCOUNTER — Encounter: Payer: Self-pay | Admitting: Pharmacist Clinician (PhC)/ Clinical Pharmacy Specialist

## 2020-11-09 ENCOUNTER — Ambulatory Visit (INDEPENDENT_AMBULATORY_CARE_PROVIDER_SITE_OTHER): Payer: Medicare Other | Admitting: Pharmacist Clinician (PhC)/ Clinical Pharmacy Specialist

## 2020-11-09 DIAGNOSIS — I10 Essential (primary) hypertension: Secondary | ICD-10-CM | POA: Diagnosis not present

## 2020-11-09 MED ORDER — CLONIDINE HCL 0.2 MG PO TABS: ORAL_TABLET | ORAL | 3 refills | Status: DC

## 2020-11-09 MED ORDER — CARVEDILOL 25 MG PO TABS: ORAL_TABLET | ORAL | 3 refills | Status: DC

## 2020-11-09 MED ORDER — VALSARTAN 160 MG PO TABS: ORAL_TABLET | ORAL | 3 refills | Status: DC

## 2020-11-09 MED ORDER — AMLODIPINE BESYLATE 10 MG PO TABS: ORAL_TABLET | ORAL | 3 refills | Status: DC

## 2020-11-09 MED ORDER — ATORVASTATIN CALCIUM 80 MG PO TABS: ORAL_TABLET | ORAL | 3 refills | Status: DC

## 2020-11-09 MED ORDER — DOXAZOSIN MESYLATE 1 MG PO TABS: ORAL_TABLET | ORAL | 1 refills | Status: AC

## 2020-11-09 NOTE — Progress Notes (Signed)
Patient ID: Jessica [name]                 DOB: [birth year]                      MRN: [id number]     HPI: Jessica [name] is a 75 y.o. female referred by Jessica [name] to HTN clinic. PMH includes asthma, cancer, heart failure, CKD, COPD, hyperlipidemia, hypertension, and stroke. She was seen in [facility] last November for hypertension and medication management.  Shortly before that visit the irbesartan and spironolactone were discontinued by Dr Jessica [name] after Scr increased from a baseline of 1.11 to 1.95.  She then saw Jessica [name] again in early January at which time her blood pressure was good at 110/70.  She did report to him episodes of lightheadedness, sudden onset and lasting for just a minute or two, in the mornings.  At that visit it was noted she had 2 episodes of complete heart block and was taking both amlodipine and diltiazem.  He discontinued the diltiazem and asked that she bring all medications for follow up with [facility].  Patient returns today for hypertension and medication reconciliation.  Her sister-in-law is with her and helps to answer several questions.  In doing a review of her medications, she cannot recall taking diltiazem at all.  Per chart it appears to have been added at her hospitalization on October 27.   She is adamant that she has not taken anything in capsule form in the past year. We went thru each of her medication bottles and discussed purpose and dosing.  She has with her a bottle of valsartan 160 mg with directions of 1 tab bid.  This medication was discontinued on Oct 27 (at hospitalization), but when she went to reorder her "cholesterol medication" this was somehow picked up in error.  She did not question the change in tablet color or directions (from 1 qd to bid).  In November a metabolic panel showed spike in SCr from 1.11 to 1.95.  I suspect this was because of the combination of valsartan 360, irbesartan 300, spironolactone 50 and hctz 25 mg.  The irbesartan and  spironolactone were discontinued, however nobody realized she was still taking valsartan, and nobody was aware of the hctz.    During her hospitalization in October/November she underwent screenings for secondary causes of hypertension.  Although her aldosterone/renin ratio was elevated, she was on valsartan at the time.  Renal artery dopplers showed 1-59% bilateral occlusion, not enough to be a secondary cause.    Current HTN meds:  Amlodipine 10mg  daily Carvedilol 25mg  twice daily Clonidine 0.2mg  twice daily Valsartan 320 mg twice daily Doxazosin 2mg  twice daily  Previously tried:  Chlorthalidone 25mg  daily HCTZ 25mg  daily Irbesartan 300mg  daily - increase in SCr (?) Nifedipine 60mg  Spironolactone 50mg  daily - increase in SCr (?) Valsartan 160mg  daily - switched to irbesartan at hospitalizaiton  BP goal: <130/80  Family History: The patient's family history includes Heart disease in her maternal aunt and mother; Hyperlipidemia in her maternal aunt and mother; Stroke in her maternal aunt and mother.  Social History: former smoker, denies alcohol use  Diet: tried to limit salt as much as possible  Exercise: activities of daily living  Home BP readings:  No readings with her today, however she notes home readings range [id number]  Click here for Secondary HTN Screening Tool (Then Refresh Note) Secondary Cause Data Comments  Herbals/Drugs  Renovascular HTN  Screened:  08/13/2020 No History Previously ruled out  RAS 1-59% bilaterally per [place]   Sleep Apnea            Hyperthyroidism            Hypothyroidism   Screened:  08/13/2020 No History Previously ruled out   TSH WNL   Hyperaldosteronism  Screened:  08/13/2020 No History Never evaluated   Aldosterone/Renin ratio elevated at 43.2  Pheochromocytoma  Screened:  08/13/2020 No History Never evaluated  Elevated at 729  (normal range 1-612)  Cushing's Syndrome  Screened:  08/13/2020 No  History Previously ruled out   Cortisol WNL  Hyperparathyroidism             Coarctation of Aorta            Chemotherapeutic Agents        Compliance                Wt Readings from Last 3 Encounters:  11/09/20 231 lb (104.8 kg)  10/19/20 230 lb (104.3 kg)  10/18/20 229 lb 6.4 oz (104.1 kg)   BP Readings from Last 3 Encounters:  11/09/20 112/64  10/19/20 128/80  10/18/20 110/70   Pulse Readings from Last 3 Encounters:  11/09/20 66  10/19/20 64  10/18/20 67    Past Medical History:  Diagnosis Date  . Asthma   . Cancer ([place])   . CHF (congestive heart failure) ([place])   . Chronic kidney disease   . COPD (chronic obstructive pulmonary disease) ([place])   . Goals of care, counseling/discussion 08/25/2020  . Hyperlipidemia   . Hypertension   . Stage I breast cancer, left ([place]) 08/25/2020  . Stroke [facility])     Current Outpatient Medications on File Prior to Visit  Medication Sig Dispense Refill  . albuterol (ACCUNEB) 1.25 MG/3ML nebulizer solution Take 3 mLs (1.25 mg total) by nebulization every 6 (six) hours as needed for wheezing. 75 mL 3  . albuterol (VENTOLIN HFA) 108 (90 Base) MCG/ACT inhaler Inhale 2 puffs into the lungs every 6 (six) hours as needed. (Patient taking differently: Inhale 2 puffs into the lungs every 6 (six) hours as needed for wheezing.) 1 each 3  . anastrozole (ARIMIDEX) 1 MG tablet Take 1 tablet (1 mg total) by mouth daily. 90 tablet 3  . aspirin EC 81 MG EC tablet Take 1 tablet (81 mg total) by mouth daily. Swallow whole. 90 tablet 0  . Fluticasone-Umeclidin-Vilant (TRELEGY ELLIPTA) 200-62.5-25 MCG/INH AEPB Inhale 1 puff into the lungs daily. 60 each 11  . sertraline (ZOLOFT) 25 MG tablet Take 25 mg by mouth daily.    . traZODone (DESYREL) 50 MG tablet Take 1 tablet (50 mg total) by mouth at bedtime as needed for sleep. 30 tablet 1  . Vitamin D, Ergocalciferol, (DRISDOL) 1.25 MG (50000 UNIT) CAPS capsule Take 1 capsule (50,000 Units total) by  mouth every 7 (seven) days. 5 capsule 3   Current Facility-Administered Medications on File Prior to Visit  Medication Dose Route Frequency Provider Last Rate Last Admin  . nitroGLYCERIN (NITROSTAT) SL tablet 0.4 mg  0.4 mg Sublingual Q5 min PRN Donato Heinz, MD   0.4 mg at 08/11/20 1610    No Known Allergies  Blood pressure 112/64, pulse 66, height 5\' 9"  (1.753 m), weight 231 lb (104.8 kg).  Essential hypertension Patient with controlled blood pressure on 5 medications.  A previous aldosterone/renin ratio was suggestive for hyperaldosteronism, however she is currently not on  spironolactone because of renal function.  Will have her decrease the valsartan to 160 mg once daily and continue with all other medications.  If her renal function remains stable or goes further back to baseline we can consider dropping doxazosin to add in low dose spironolactone.  Also re-sent her cardiac prescriptions to pharmacy with indication for use on each label.     Tommy Medal PharmD CPP [facility] [address] [place] [place] [address] 11/09/2020 3:58 PM

## 2020-11-09 NOTE — Assessment & Plan Note (Addendum)
Patient with controlled blood pressure on 5 medications.  A previous aldosterone/renin ratio was suggestive for hyperaldosteronism, however she is currently not on spironolactone because of renal function.  Will have her decrease the valsartan to 160 mg once daily and continue with all other medications.  If her renal function remains stable or goes further back to baseline we can consider dropping doxazosin to add in low dose spironolactone.  Also re-sent her cardiac prescriptions to pharmacy with indication for use on each label.

## 2020-11-09 NOTE — Patient Instructions (Signed)
Take your BP meds as follows:  AM: carvedilol 25 mg, clonidine 0.2 mg, doxazosin 1 mg, amlodipine 10 mg  PM: carvedilol 25 mg, clonidine 0.2 mg, doxazosin 1 mg, valsartan 160 mg  Continue to check your blood pressure once daily in the mornings.  Keep track of the readings.  If you notice that the systolic (top numbers) are above 130 more frequently, please call to let us know.  Kristin/Raquel at [phone].  Bring all of your meds, your BP cuff and your record of home blood pressures to your next appointment.  Exercise as you're able, try to walk approximately 30 minutes per day.  Keep salt intake to a minimum, especially watch canned and prepared boxed foods.  Eat more fresh fruits and vegetables and fewer canned items.  Avoid eating in fast food restaurants.    HOW TO TAKE YOUR BLOOD PRESSURE: . Rest 5 minutes before taking your blood pressure. .  Don't smoke or drink caffeinated beverages for at least 30 minutes before. . Take your blood pressure before (not after) you eat. . Sit comfortably with your back supported and both feet on the floor (don't cross your legs). . Elevate your arm to heart level on a table or a desk. . Use the proper sized cuff. It should fit smoothly and snugly around your bare upper arm. There should be enough room to slip a fingertip under the cuff. The bottom edge of the cuff should be 1 inch above the crease of the elbow. . Ideally, take 3 measurements at one sitting and record the average.

## 2020-11-16 ENCOUNTER — Telehealth: Payer: Self-pay | Admitting: *Deleted

## 2020-11-16 NOTE — Telephone Encounter (Signed)
Called [employer] to follow up on ONO order faxed on 1/5  Completed on 1/13  Results to be refaxed to [phone]

## 2020-11-19 NOTE — Telephone Encounter (Signed)
Report received, to be reviewed by Dr. Gardiner Rhyme

## 2020-11-25 ENCOUNTER — Ambulatory Visit: Payer: Medicare Other | Admitting: Cardiology

## 2020-11-30 ENCOUNTER — Telehealth: Payer: Self-pay | Admitting: Pulmonary Disease

## 2020-11-30 ENCOUNTER — Other Ambulatory Visit: Payer: Self-pay | Admitting: Pulmonary Disease

## 2020-11-30 DIAGNOSIS — G4734 Idiopathic sleep related nonobstructive alveolar hypoventilation: Secondary | ICD-10-CM

## 2020-11-30 NOTE — Telephone Encounter (Signed)
I called and spoke with patient regarding ONO results. She verbalized understanding and agreed with O2 at night. I went ahead and sent in order and notified her DME company will be reaching out her. She verbalized understanding and nothing further was needed.

## 2020-11-30 NOTE — Telephone Encounter (Signed)
-----   Message from Silverio Lay, RN sent at 11/30/2020 10:45 AM EST ----- ONO-needs follow up.    Thanks!

## 2020-11-30 NOTE — Telephone Encounter (Signed)
ONO report reviewed by Dr. Gardiner Rhyme, patient needs follow up with pulmonologist for overnight O2.   Report to be scanned into Epic, will send to pulmonologist.   Spoke to patient, aware and verbalized understanding.

## 2020-11-30 NOTE — Telephone Encounter (Signed)
11/30/2020  Patient's overnight oximetry on room air shows that she drops below 88% for greater than 30 minutes.  Would recommend that she start 2 L of O2 at night.  Wyn Quaker, FNP

## 2020-12-19 NOTE — Progress Notes (Signed)
Cardiology Office Note:    Date:  12/22/2020   ID:  Jessica [name], DOB 11/07/1945, MRN [id number]  PCP:  Ronnald Nian, DO  Cardiologist:  Donato Heinz, MD  Electrophysiologist:  None   Referring MD: Ronnald Nian, DO   Chief Complaint  Patient presents with  . Hypertension   History of Present Illness:    Jessica [name] is a 75 y.o. female with a hx of CVA, COPD, hypertension, hyperlipidemia, CKD, breast cancer, CAD status post PCI who presents for follow-up.  She was initially seen on 08/11/2020.  She moved to [place] from [place] in July 2021.  Reports a history of CAD status post MI in stent x2.  Reports first MI was in 2010, had stent at [facility].  Second stent was DES to LAD in 2017.  At initial clinic visit on 08/11/2020.  She reported having chest pain and BP was markedly elevated.  It was recommended that she go to the ED and she was admitted to [facility] from 10/27 through 08/17/2020.  Troponins were negative.  She initially required nicardipine drip for hypertension.  Work-up of secondary hypertension included renal artery duplex showing bilateral renal artery stenosis 1 to 59%, normal TSH, elevated aldosterone/renin ratio (43), elevated metanephrines.  Referred to endocrinology for further evaluation.  Echocardiogram on 08/13/2020 showed normal biventricular function, no significant valvular disease.  Zio patch x14 days on 10/04/2020 showed 2 episodes of complete heart block, longest lasting 15 seconds, both occurring at night, frequent PVCs (11.5% of beats), one 4 beat episode of NSVT.  Since last clinic visit, she reports that she has been doing well.  Has been monitoring BP at home, has been 110s to 130s over 60s to 70s.  Reports she has been having pain in her legs, mostly at rest.  Has been feeling short of breath but denies any chest pain.  She denies any lightheadedness, syncope, palpitations, or lower extremity edema.  Main complaint recently is she  has been having dysphagia.  Wt Readings from Last 3 Encounters:  12/22/20 235 lb (106.6 kg)  11/09/20 231 lb (104.8 kg)  10/19/20 230 lb (104.3 kg)    Past Medical History:  Diagnosis Date  . Asthma   . Cancer ([place])   . CHF (congestive heart failure) ([place])   . Chronic kidney disease   . COPD (chronic obstructive pulmonary disease) ([place])   . Goals of care, counseling/discussion 08/25/2020  . Hyperlipidemia   . Hypertension   . Stage I breast cancer, left ([place]) 08/25/2020  . Stroke [facility])     Past Surgical History:  Procedure Laterality Date  . stomach surgery  1990    Current Medications: Current Meds  Medication Sig  . albuterol (ACCUNEB) 1.25 MG/3ML nebulizer solution Take 3 mLs (1.25 mg total) by nebulization every 6 (six) hours as needed for wheezing.  Marland Kitchen albuterol (VENTOLIN HFA) 108 (90 Base) MCG/ACT inhaler Inhale 2 puffs into the lungs every 6 (six) hours as needed. (Patient taking differently: Inhale 2 puffs into the lungs every 6 (six) hours as needed for wheezing.)  . amLODipine (NORVASC) 10 MG tablet Take 1 tablet by mouth daily for blood pressure  . anastrozole (ARIMIDEX) 1 MG tablet Take 1 tablet (1 mg total) by mouth daily.  Marland Kitchen atorvastatin (LIPITOR) 80 MG tablet Take 1 tablet by mouth daily for cholesterol  . carvedilol (COREG) 25 MG tablet Take 1 tablet by mouth twice daily for blood pressure  . cloNIDine (CATAPRES) 0.2 MG tablet Take  1 tablet by mouth twice daily for blood pressure  . doxazosin (CARDURA) 1 MG tablet Take 1 tablet by mouth twice daily for blood pressure  . Fluticasone-Umeclidin-Vilant (TRELEGY ELLIPTA) 200-62.5-25 MCG/INH AEPB Inhale 1 puff into the lungs daily.  . hydrochlorothiazide (HYDRODIURIL) 25 MG tablet Take 25 mg by mouth daily.  . sertraline (ZOLOFT) 25 MG tablet Take 25 mg by mouth daily.  . traZODone (DESYREL) 50 MG tablet Take 1 tablet (50 mg total) by mouth at bedtime as needed for sleep.  . valsartan (DIOVAN) 160 MG tablet Take 1  tablet by mouth daily for blood pressure  . Vitamin D, Ergocalciferol, (DRISDOL) 1.25 MG (50000 UNIT) CAPS capsule Take 1 capsule (50,000 Units total) by mouth every 7 (seven) days.   Current Facility-Administered Medications for the 12/22/20 encounter (Office Visit) with Donato Heinz, MD  Medication  . nitroGLYCERIN (NITROSTAT) SL tablet 0.4 mg     Allergies:   Patient has no known allergies.   Social History   Socioeconomic History  . Marital status: Widowed    Spouse name: Not on file  . Number of children: Not on file  . Years of education: Not on file  . Highest education level: Not on file  Occupational History  . Not on file  Tobacco Use  . Smoking status: Former Smoker    Packs/day: 0.50    Years: 55.00    Pack years: 27.50    Types: Cigarettes    Start date: 10/17/1963    Quit date: 01/15/2019    Years since quitting: 1.9  . Smokeless tobacco: Never Used  Vaping Use  . Vaping Use: Never used  Substance and Sexual Activity  . Alcohol use: Never  . Drug use: Never  . Sexual activity: Not Currently  Other Topics Concern  . Not on file  Social History Narrative  . Not on file   Social Determinants of Health   Financial Resource Strain: Not on file  Food Insecurity: Not on file  Transportation Needs: Not on file  Physical Activity: Not on file  Stress: Not on file  Social Connections: Not on file     Family History: The patient's family history includes Heart disease in her maternal aunt and mother; Hyperlipidemia in her maternal aunt and mother; Stroke in her maternal aunt and mother.  ROS:   Please see the history of present illness.     All other systems reviewed and are negative.  EKGs/Labs/Other Studies Reviewed:    The following studies were reviewed today:   EKG:  EKG is not ordered today.  The ekg ordered at prior clinic visit demonstrates normal sinus rhythm, frequent PVCs, poor R wave progression  Recent Labs: 08/11/2020: B  Natriuretic Peptide 65.3 08/12/2020: TSH 1.693 10/12/2020: ALT 12; Hemoglobin 13.8; Platelets 173.0 10/18/2020: BUN 23; Creatinine, Ser 1.34; Magnesium 2.1; Potassium 4.1; Sodium 144  Recent Lipid Panel    Component Value Date/Time   CHOL 128 08/12/2020 0505   TRIG 95 08/12/2020 0505   HDL 45 08/12/2020 0505   CHOLHDL 2.8 08/12/2020 0505   VLDL 19 08/12/2020 0505   LDLCALC 64 08/12/2020 0505    Physical Exam:    VS:  BP (!) 100/56   Pulse 62   Ht 5\' 9"  (1.753 m)   Wt 235 lb (106.6 kg)   SpO2 95%   BMI 34.70 kg/m     Wt Readings from Last 3 Encounters:  12/22/20 235 lb (106.6 kg)  11/09/20 231 lb (104.8 kg)  10/19/20 230 lb (104.3 kg)     GEN:  in no acute distress HEENT: Normal NECK: No JVD; No carotid bruits CARDIAC: RRR, no murmurs, rubs, gallops RESPIRATORY:  Clear to auscultation without rales, wheezing or rhonchi  ABDOMEN: Soft, non-tender, non-distended MUSCULOSKELETAL:  No edema; No deformity  SKIN: Warm and dry NEUROLOGIC:  Alert and oriented x 3 PSYCHIATRIC:  Normal affect   ASSESSMENT:    1. Essential hypertension   2. 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 CAD in native artery   4. Leg pain, bilateral   5. Dysphagia, unspecified type   6. Elevated plasma metanephrines   7. Frequent PVCs   8. Stage 3 chronic kidney disease, unspecified whether stage 3a or 3b CKD (HCC)    PLAN:     Hypertension: On carvedilol 25 mg twice daily, clonidine 0.2 mg twice daily, doxazosin 2 mg BID, amlodipine 10 mg daily, valsartan 160 mg daily, HCTZ 25 mg daily. Appears controlled, continue current regimen.  Work-up of secondary hypertension included renal artery duplex showing bilateral renal artery stenosis 1 to 59%, normal TSH, sleep study with mild OSA not meeting criteria for CPAP, elevated aldosterone/renin ratio (43), elevated metanephrines.  Referred to endocrinology for further evaluation. Check BMET.  Frequent PVCs/complete heart block:  Zio patch x14 days on  10/04/2020 showed 2 episodes of complete heart block, lasted 15 seconds, both occurring at night, frequent PVCs (11.5% of beats), one 4 beat episode of NSVT.   -PVC burden 12% of beats despite coreg and diltiazem, but also with short episodes of what appears to be complete heart block.  Referred to EP, no changes recommended  CAD: Status post MI and stent x2, last in 2017.   -Continue aspirin 81 mg daily, atorvastatin 80 mg daily  CVA: on aspirin, statin  Hyperlipidemia: LDL 64 on 08/12/2020.  On atorvastatin 80 mg daily  CKD stage III: Creatinine 1.6 on 10/12/2020.  Follows with nephrology  OSA: Diagnosed in 2012, reports was never started on CPAP.  Sleep study 10/05/20 with mild OSA not meeting criteria for CPAP, along with overnight desaturations.  Overnight oximetry study showed low SpO2, started on nocturnal O2.  Leg pain: Will check ABIs  Dysphagia: Reports sensation of food getting stuck when she swallows.  Will refer to GI for evaluation.   RTC in 3 months  Medication Adjustments/Labs and Tests Ordered: Current medicines are reviewed at length with the patient today.  Concerns regarding medicines are outlined above.  Orders Placed This Encounter  Procedures  . Basic metabolic panel  . Magnesium  . Ambulatory referral to Endocrinology  . Ambulatory referral to Gastroenterology  . VAS [place] ABI WITH/WO TBI  . VAS [place] LOWER EXTREMITY ARTERIAL DUPLEX   No orders of the defined types were placed in this encounter.   Patient Instructions  Medication Instructions:  Your physician recommends that you continue on your current medications as directed. Please refer to the Current Medication list given to you today.  *If you need a refill on your cardiac medications before your next appointment, please call your pharmacy*   Lab Work: BMET, [place] today  If you have labs (blood work) drawn today and your tests are completely normal, you will receive your results only by: Marland Kitchen MyChart  Message (if you have MyChart) OR . A paper copy in the mail If you have any lab test that is abnormal or we need to change your treatment, we will call you to review the results.  Testing/Procedures: Your physician has requested that you have an ankle brachial index (ABI). During this test an ultrasound and blood pressure cuff are used to evaluate the arteries that supply the arms and legs with blood. Allow thirty minutes for this exam. There are no restrictions or special instructions.   Follow-Up: At [facility], you and your health needs are our priority.  As part of our continuing mission to provide you with exceptional heart care, we have created designated Provider Care Teams.  These Care Teams include your primary Cardiologist (physician) and Advanced Practice Providers (APPs -  Physician Assistants and Nurse Practitioners) who all work together to provide you with the care you need, when you need it.  We recommend signing up for the patient portal called "MyChart".  Sign up information is provided on this After Visit Summary.  MyChart is used to connect with patients for Virtual Visits (Telemedicine).  Patients are able to view lab/test results, encounter notes, upcoming appointments, etc.  Non-urgent messages can be sent to your provider as well.   To learn more about what you can do with MyChart, go to [email or web address].    Your next appointment:   3 month(s)  The format for your next appointment:   In Person  Provider:   Oswaldo Milian, MD   Other Instructions You have been referred [id number]  You have been referred to: Gastroenterology      Signed, Donato Heinz, MD  12/22/2020 1:14 PM    [place]

## 2020-12-22 ENCOUNTER — Other Ambulatory Visit: Payer: Self-pay

## 2020-12-22 ENCOUNTER — Encounter: Payer: Self-pay | Admitting: Cardiology

## 2020-12-22 ENCOUNTER — Ambulatory Visit: Payer: Medicare Other | Admitting: Cardiology

## 2020-12-22 VITALS — BP 100/56 | HR 62 | Ht 69.0 in | Wt 235.0 lb

## 2020-12-22 DIAGNOSIS — R7989 Other specified abnormal findings of blood chemistry: Secondary | ICD-10-CM

## 2020-12-22 DIAGNOSIS — M79604 Pain in right leg: Secondary | ICD-10-CM

## 2020-12-22 DIAGNOSIS — N183 Chronic kidney disease, stage 3 unspecified: Secondary | ICD-10-CM

## 2020-12-22 DIAGNOSIS — I2510 Atherosclerotic heart disease of native coronary artery without angina pectoris: Secondary | ICD-10-CM

## 2020-12-22 DIAGNOSIS — R1310 Dysphagia, unspecified: Secondary | ICD-10-CM

## 2020-12-22 DIAGNOSIS — M79605 Pain in left leg: Secondary | ICD-10-CM

## 2020-12-22 DIAGNOSIS — I10 Essential (primary) hypertension: Secondary | ICD-10-CM

## 2020-12-22 DIAGNOSIS — I493 Ventricular premature depolarization: Secondary | ICD-10-CM

## 2020-12-22 NOTE — Patient Instructions (Signed)
Medication Instructions:  Your physician recommends that you continue on your current medications as directed. Please refer to the Current Medication list given to you today.  *If you need a refill on your cardiac medications before your next appointment, please call your pharmacy*   Lab Work: BMET, [place] today  If you have labs (blood work) drawn today and your tests are completely normal, you will receive your results only by: Marland Kitchen MyChart Message (if you have MyChart) OR . A paper copy in the mail If you have any lab test that is abnormal or we need to change your treatment, we will call you to review the results.   Testing/Procedures: Your physician has requested that you have an ankle brachial index (ABI). During this test an ultrasound and blood pressure cuff are used to evaluate the arteries that supply the arms and legs with blood. Allow thirty minutes for this exam. There are no restrictions or special instructions.   Follow-Up: At [facility], you and your health needs are our priority.  As part of our continuing mission to provide you with exceptional heart care, we have created designated Provider Care Teams.  These Care Teams include your primary Cardiologist (physician) and Advanced Practice Providers (APPs -  Physician Assistants and Nurse Practitioners) who all work together to provide you with the care you need, when you need it.  We recommend signing up for the patient portal called "MyChart".  Sign up information is provided on this After Visit Summary.  MyChart is used to connect with patients for Virtual Visits (Telemedicine).  Patients are able to view lab/test results, encounter notes, upcoming appointments, etc.  Non-urgent messages can be sent to your provider as well.   To learn more about what you can do with MyChart, go to [email or web address].    Your next appointment:   3 month(s)  The format for your next appointment:   In Person  Provider:    Oswaldo Milian, MD   Other Instructions You have been referred [id number]  You have been referred to: Gastroenterology

## 2020-12-23 ENCOUNTER — Inpatient Hospital Stay: Payer: Medicare Other

## 2020-12-23 ENCOUNTER — Inpatient Hospital Stay: Payer: Medicare Other | Admitting: Hematology & Oncology

## 2020-12-23 LAB — BASIC METABOLIC PANEL
BUN/Creatinine Ratio: 10 — ABNORMAL LOW (ref 12–28)
BUN: 14 mg/dL (ref 8–27)
CO2: 24 mmol/L (ref 20–29)
Calcium: 9.7 mg/dL (ref 8.7–10.3)
Chloride: 105 mmol/L (ref 96–106)
Creatinine, Ser: 1.34 mg/dL — ABNORMAL HIGH (ref 0.57–1.00)
Glucose: 98 mg/dL (ref 65–99)
Potassium: 4 mmol/L (ref 3.5–5.2)
Sodium: 147 mmol/L — ABNORMAL HIGH (ref 134–144)
eGFR: 42 mL/min/{1.73_m2} — ABNORMAL LOW (ref >59)

## 2020-12-23 LAB — MAGNESIUM: Magnesium: 1.8 mg/dL (ref 1.6–2.3)

## 2020-12-30 ENCOUNTER — Ambulatory Visit: Payer: Medicare Other | Admitting: Pulmonary Disease

## 2020-12-30 ENCOUNTER — Other Ambulatory Visit: Payer: Self-pay

## 2020-12-30 ENCOUNTER — Encounter: Payer: Self-pay | Admitting: Pulmonary Disease

## 2020-12-30 ENCOUNTER — Encounter: Payer: Self-pay | Admitting: Physician Assistant

## 2020-12-30 ENCOUNTER — Telehealth: Payer: Self-pay | Admitting: Cardiology

## 2020-12-30 VITALS — BP 110/68 | HR 78 | Temp 97.3°F | Ht 69.0 in | Wt 233.2 lb

## 2020-12-30 DIAGNOSIS — J4489 Other specified chronic obstructive pulmonary disease: Secondary | ICD-10-CM

## 2020-12-30 DIAGNOSIS — J449 Chronic obstructive pulmonary disease, unspecified: Secondary | ICD-10-CM | POA: Diagnosis not present

## 2020-12-30 MED ORDER — BREZTRI AEROSPHERE 160-9-4.8 MCG/ACT IN AERO: 2.0000 | INHALATION_SPRAY | Freq: Two times a day (BID) | RESPIRATORY_TRACT | 5 refills | Status: DC

## 2020-12-30 MED ORDER — IPRATROPIUM-ALBUTEROL 0.5-2.5 (3) MG/3ML IN SOLN: RESPIRATORY_TRACT | 5 refills | Status: AC

## 2020-12-30 NOTE — Progress Notes (Addendum)
Jessica [name]    [id number]    18-Jul-1946  Primary Care Physician:Cirigliano, Garvin Fila, DO  Referring Physician: Ronnald Nian, DO [place],  [place] [address]  Chief complaint: Follow-up for COPD, OSA  HPI: 75 year old with severe COPD, OSA She moved to [place] from [place] in July 2021 and is here to establish care Previously followed at [facility] in [place]  Has history of severe COPD with multiple exacerbations in the past Question of noncompliance with medication per office notes She was on Was on Anoro on 2018.  Inhaler changed to Bevespi, Trelegy in 2019.  Due to coverage with insurance she has changed to Advair. Sleep study in 2012 diagnosed with OSA but never started on CPAP.  She was referred for repeat sleep study but was not interested in getting this done Diagnosed with breast cancer in February 2019 with lumpectomy and radiation.  She is currently on hormonal therapy.  Chief complaint is dyspnea on exertion which is gradually getting worse.  Denies any cough, sputum proximal, fevers, chills  Pets: No pets Occupation: Retired Psychologist, counselling Exposures: No known exposures.  No mold, hot tub, Jacuzzi Smoking history: 50-pack-year smoker.  Quit in 2020 Travel history: Previously lived in [place], [place] Relevant family history: No significant family history of lung disease  Interim history: Change to Trelegy at last visit.  States that her breathing is still not improved Has chronic dyspnea on exertion, daily cough with mucus production  Sleep study in late 2021 did not show sleep apnea.  She did however have nocturnal desats and started on supplemental oxygen  Outpatient Encounter Medications as of 12/30/2020  Medication Sig  . albuterol (ACCUNEB) 1.25 MG/3ML nebulizer solution Take 3 mLs (1.25 mg total) by nebulization every 6 (six) hours as needed for wheezing.  Marland Kitchen albuterol (VENTOLIN HFA) 108 (90  Base) MCG/ACT inhaler Inhale 2 puffs into the lungs every 6 (six) hours as needed. (Patient taking differently: Inhale 2 puffs into the lungs every 6 (six) hours as needed for wheezing.)  . amLODipine (NORVASC) 10 MG tablet Take 1 tablet by mouth daily for blood pressure  . anastrozole (ARIMIDEX) 1 MG tablet Take 1 tablet (1 mg total) by mouth daily.  Marland Kitchen aspirin 81 MG chewable tablet Chew 81 mg by mouth daily.  Marland Kitchen atorvastatin (LIPITOR) 80 MG tablet Take 1 tablet by mouth daily for cholesterol  . carvedilol (COREG) 25 MG tablet Take 1 tablet by mouth twice daily for blood pressure  . cloNIDine (CATAPRES) 0.2 MG tablet Take 1 tablet by mouth twice daily for blood pressure  . doxazosin (CARDURA) 1 MG tablet Take 1 tablet by mouth twice daily for blood pressure  . Fluticasone-Umeclidin-Vilant (TRELEGY ELLIPTA) 200-62.5-25 MCG/INH AEPB Inhale 1 puff into the lungs daily.  . sertraline (ZOLOFT) 25 MG tablet Take 25 mg by mouth daily.  . traZODone (DESYREL) 50 MG tablet Take 1 tablet (50 mg total) by mouth at bedtime as needed for sleep.  . valsartan (DIOVAN) 160 MG tablet Take 1 tablet by mouth daily for blood pressure  . Vitamin D, Ergocalciferol, (DRISDOL) 1.25 MG (50000 UNIT) CAPS capsule Take 1 capsule (50,000 Units total) by mouth every 7 (seven) days.  . [DISCONTINUED] hydrochlorothiazide (HYDRODIURIL) 25 MG tablet Take 25 mg by mouth daily. (Patient not taking: Reported on 12/30/2020)   Facility-Administered Encounter Medications as of 12/30/2020  Medication  . nitroGLYCERIN (NITROSTAT) SL tablet 0.4 mg   Physical Exam:  Blood pressure 110/68, pulse 78, temperature (!) 97.3 F (36.3 C), temperature source Temporal, height 5\' 9"  (1.753 m), weight 233 lb 3.2 oz (105.8 kg), SpO2 95 %. Gen:      No acute distress HEENT:  EOMI, sclera anicteric Neck:     No masses; no thyromegaly Lungs:    Clear to auscultation bilaterally; normal respiratory effort CV:         Regular rate and rhythm; no  murmurs Abd:      + bowel sounds; soft, non-tender; no palpable masses, no distension Ext:    No edema; adequate peripheral perfusion Skin:      Warm and dry; no rash Neuro: alert and oriented x 3 Psych: normal mood and affect  Data Reviewed: Imaging: Chest x-ray 08/11/2020- No active cardiopulmonary disease. Cardiomegaly. Probable left CP angle scarring.  CT 07/14/20-mild emphysematous changes, atelectasis in the lingula, subcentimeter pulmonary nodules I have reviewed the images personally.  PFTs: 10/21/2018 FVC 2.03 [69%], FEV1 1.16 [51%], F/F 57, TLC 4.75 [93%], DLCO 19.3 [92%] Severe obstruction  10/12/2020 FVC 1.79 [64%], FEV1 1.20 [5%], F/F 67, TLC 4.65 [82%], DLCO 19.94 [90%] Severe obstruction  Labs: CBC 08/12/2020-WBC 9.3, eos 2.2%, absolute eosinophil count 213 IgE 10/12/2020-1648 Alpha-1 antitrypsin 10/12/2020-129, PI MM  Echocardiogram 12/23/2019 LVEF 55-60%, mild LVH, grade 2 diastolic dysfunction  Sleep: 10/05/2020-split-night sleep study-AHI 1.9  11/30/2020 Overnight oximetry on room air shows that she drops below 88% for greater than 30 minutes.  Would recommend that she start 2 L of O2 at night.  Assessment:  Severe COPD, asthma Continues on Trelegy with no relief of symptoms We will try breztri inhaler  Continue duo nebs  Consider biologic therapy as she likely has asthma with mild elevated peripheral eosinophils and elevated IgE.  She is however afraid of self injection, needles and would like to hold off for now  Nocturnal hypoxia Continue supplemental oxygen, no evidence of OSA on sleep study  Plan/Recommendations: Change Trelegy to breztri Continue supplemental oxygen at night  Follow-up in 6 months  Marshell Garfinkel MD [facility] 12/30/2020, 11:11 AM  CC: Ronnald Nian, DO

## 2020-12-30 NOTE — Telephone Encounter (Signed)
Left message regarding Thursday 01/13/21 9:16m GI appointment with Dr. Rush Farmer time is 9:15 am --[address]--phone [phone].  Office has mailed paperwork to patient and I requested she call with questions or concerns.

## 2020-12-30 NOTE — Addendum Note (Signed)
Addended by: Vanessa Barbara on: 12/30/2020 01:01 PM   Modules accepted: Orders

## 2020-12-30 NOTE — Patient Instructions (Addendum)
Stop Trelegy and start breztri Start duo nebs 3-4 times a day as needed for shortness of breath  Work on weight loss with diet and exercise  Follow-up in 6 months

## 2021-01-06 ENCOUNTER — Other Ambulatory Visit: Payer: Self-pay | Admitting: Cardiology

## 2021-01-06 DIAGNOSIS — I70223 Atherosclerosis of native arteries of extremities with rest pain, bilateral legs: Secondary | ICD-10-CM

## 2021-01-06 DIAGNOSIS — M79604 Pain in right leg: Secondary | ICD-10-CM

## 2021-01-10 ENCOUNTER — Telehealth: Payer: Self-pay | Admitting: Cardiology

## 2021-01-10 ENCOUNTER — Other Ambulatory Visit: Payer: Self-pay | Admitting: *Deleted

## 2021-01-10 ENCOUNTER — Telehealth: Payer: Self-pay | Admitting: Family Medicine

## 2021-01-10 DIAGNOSIS — Z72820 Sleep deprivation: Secondary | ICD-10-CM

## 2021-01-10 DIAGNOSIS — I10 Essential (primary) hypertension: Secondary | ICD-10-CM

## 2021-01-10 DIAGNOSIS — N183 Chronic kidney disease, stage 3 unspecified: Secondary | ICD-10-CM

## 2021-01-10 NOTE — Telephone Encounter (Signed)
What is the name of the medication? traZODone (DESYREL) 50 MG tablet [[id number]]    Have you contacted your pharmacy to request a refill? Pt is needing a refill on a script similar to this, but stronger. Pt's son has just died and she is having a hard time sleeping at night.   Which pharmacy would you like this sent to?Pharmacy  [facility] [phone] - [place], [place] [address]  [address] [place] [address]  Phone:  [phone] Fax:  [phone]  DEA #:  [id number]       Patient notified that their request is being sent to the clinical staff for review and that they should receive a call once it is complete. If they do not receive a call within 72 hours they can check with their pharmacy or our office.

## 2021-01-10 NOTE — Telephone Encounter (Signed)
Spoke with the patient's sister per the patient request. She recently lost her son and would like something for anxiety and to help her sleep. She currently has trazodone prescribed by her PCP but stated that this does not work. They have been advised to call her PCP and let them know that this medication is not working for her.

## 2021-01-10 NOTE — Telephone Encounter (Signed)
Pt c/o medication issue:  1. Name of Medication: Sleeping/anxiety medication   2. How are you currently taking this medication (dosage and times per day)?   3. Are you having a reaction (difficulty breathing--STAT)? No   4. What is your medication issue? Jessica [name] is calling stating she recently lost her son. Since this occurence she has been having trouble sleeping as well as anxiety issues. She is requesting a medication be prescribed to help her sleep due to it being 3-4 days since the last time she could. The medication currently prescribed for this does not work for her. They are requesting a callback today in regards to this. Please advise.

## 2021-01-11 NOTE — Telephone Encounter (Signed)
I spoke with pt and she said that it would be ok with her to up the dose.  Pt would like for Dr. Loletha Grayer to send it in for her.

## 2021-01-11 NOTE — Telephone Encounter (Signed)
I'm so sorry for pts loss! She can increase the dose to 75mg  or 100mg  (max) at bedtime. If she wants to schedule a VV or even telephone appt to discuss other med options, I'm happy to do that

## 2021-01-11 NOTE — Telephone Encounter (Signed)
I called pt back and LDM, informing her that she may have to schedule an appointment to discuss something stronger with her provider.  I also informed pt that I would send message to Dr. Loletha Grayer.

## 2021-01-12 MED ORDER — TRAZODONE HCL 50 MG PO TABS: ORAL_TABLET | ORAL | 1 refills | Status: DC

## 2021-01-12 NOTE — Telephone Encounter (Signed)
Rx sent 

## 2021-01-13 ENCOUNTER — Ambulatory Visit: Payer: Medicare Other | Admitting: Physician Assistant

## 2021-01-13 ENCOUNTER — Inpatient Hospital Stay (HOSPITAL_COMMUNITY): Admission: RE | Admit: 2021-01-13 | Payer: Medicare Other | Source: Ambulatory Visit

## 2021-01-20 ENCOUNTER — Other Ambulatory Visit: Payer: Self-pay | Admitting: Family Medicine

## 2021-01-20 ENCOUNTER — Ambulatory Visit (INDEPENDENT_AMBULATORY_CARE_PROVIDER_SITE_OTHER): Payer: Medicare Other | Admitting: Family Medicine

## 2021-01-20 ENCOUNTER — Other Ambulatory Visit: Payer: Self-pay

## 2021-01-20 ENCOUNTER — Encounter: Payer: Self-pay | Admitting: Family Medicine

## 2021-01-20 VITALS — BP 126/80 | HR 75 | Temp 97.7°F | Ht 69.0 in | Wt 230.6 lb

## 2021-01-20 DIAGNOSIS — R200 Anesthesia of skin: Secondary | ICD-10-CM

## 2021-01-20 DIAGNOSIS — M5442 Lumbago with sciatica, left side: Secondary | ICD-10-CM

## 2021-01-20 DIAGNOSIS — R202 Paresthesia of skin: Secondary | ICD-10-CM

## 2021-01-20 DIAGNOSIS — E6609 Other obesity due to excess calories: Secondary | ICD-10-CM

## 2021-01-20 DIAGNOSIS — I2510 Atherosclerotic heart disease of native coronary artery without angina pectoris: Secondary | ICD-10-CM | POA: Diagnosis not present

## 2021-01-20 DIAGNOSIS — Z6834 Body mass index (BMI) 34.0-34.9, adult: Secondary | ICD-10-CM

## 2021-01-20 DIAGNOSIS — E559 Vitamin D deficiency, unspecified: Secondary | ICD-10-CM | POA: Diagnosis not present

## 2021-01-20 DIAGNOSIS — F419 Anxiety disorder, unspecified: Secondary | ICD-10-CM

## 2021-01-20 DIAGNOSIS — Z634 Disappearance and death of family member: Secondary | ICD-10-CM

## 2021-01-20 DIAGNOSIS — J449 Chronic obstructive pulmonary disease, unspecified: Secondary | ICD-10-CM | POA: Diagnosis not present

## 2021-01-20 DIAGNOSIS — I10 Essential (primary) hypertension: Secondary | ICD-10-CM

## 2021-01-20 DIAGNOSIS — G8929 Other chronic pain: Secondary | ICD-10-CM

## 2021-01-20 DIAGNOSIS — F5104 Psychophysiologic insomnia: Secondary | ICD-10-CM

## 2021-01-20 DIAGNOSIS — F4321 Adjustment disorder with depressed mood: Secondary | ICD-10-CM

## 2021-01-20 LAB — VITAMIN B12: Vitamin B-12: 375 pg/mL (ref 211–911)

## 2021-01-20 LAB — CBC
HCT: 43 % (ref 36.0–46.0)
Hemoglobin: 14 g/dL (ref 12.0–15.0)
MCHC: 32.4 g/dL (ref 30.0–36.0)
MCV: 88.9 fl (ref 78.0–100.0)
Platelets: 155 10*3/uL (ref 150.0–400.0)
RBC: 4.83 Mil/uL (ref 3.87–5.11)
RDW: 14.3 % (ref 11.5–15.5)
WBC: 6.6 10*3/uL (ref 4.0–10.5)

## 2021-01-20 LAB — LIPID PANEL
Cholesterol: 138 mg/dL (ref 0–200)
HDL: 46.5 mg/dL (ref >39.00)
LDL Cholesterol: 68 mg/dL (ref 0–99)
NonHDL: 91.83
Total CHOL/HDL Ratio: 3
Triglycerides: 119 mg/dL (ref 0.0–149.0)
VLDL: 23.8 mg/dL (ref 0.0–40.0)

## 2021-01-20 LAB — TSH: TSH: 2.02 u[IU]/mL (ref 0.35–4.50)

## 2021-01-20 LAB — HEMOGLOBIN A1C: Hgb A1c MFr Bld: 6.3 % (ref 4.6–6.5)

## 2021-01-20 LAB — VITAMIN D 25 HYDROXY (VIT D DEFICIENCY, FRACTURES): VITD: 39.23 ng/mL (ref 30.00–100.00)

## 2021-01-20 MED ORDER — SERTRALINE HCL 50 MG PO TABS: 50.0000 mg | ORAL_TABLET | Freq: Every day | ORAL | 1 refills | Status: DC

## 2021-01-20 MED ORDER — ZOLPIDEM TARTRATE 5 MG PO TABS
5.0000 mg | ORAL_TABLET | Freq: Every evening | ORAL | 1 refills | Status: AC | PRN
Start: 2021-01-20 — End: ?

## 2021-01-20 NOTE — Patient Instructions (Addendum)
Stop trazodone  Start ambien 5mg  1 tab 30-60 min before  Increase sertraline from 25mg  to 50mg  daily - take 2 25mg  tabs to make 50mg  daily and then I sent a new Rx for 50mg  tabs to the pharmacy See how this does over the next 2-3 weeks and then let me know

## 2021-01-20 NOTE — Progress Notes (Signed)
Chief Complaint  Patient presents with  . Follow-up    Pt here for 6 month follow up visit.  Pt is fasting for lab work.  Pt c/o legs burning x 3 months.  Pt also same for lt hand pinky and ring finger.    HPI: Jessica [name] is a 75 y.o. female here for routine 36mo f/u on HTN, Vit D deficiency, COPD, asthma, CAD follow-up. She is accompanied by her SIL. She follows with pulmonary Dr. Vaughan Browner and cardio Dr. Gardiner Rhyme. For HTN, pt is taking norvasc 10mg  daily, coreg 25mg  BID, clonidine 0.2mg  BID, cardura 1mg  BID, valsartan 160mg  daily.  She is on lipitor 80mg  daily for CAD.  She was taking Vit D 50,000IU weekly but is not taking daily Her pulmonary meds includebreztri and trelegy as well as PRN albuterol and neb.  Her son passed away suddenly about 2 wks ago. She is not sleeping well and trazodone up to 100mg  qHS. She is on sertraline 25mg  daily but wonders about another med or higher dose.   She also complains of burning in B/L LE x 3 mo. She has chronic LBP but this is not new or worse. She also notes decreased sensation in the pads of the Lt 4th and 5th fingers. No neck pain, elbow, wrist pain.   BP Readings from Last 3 Encounters:  01/20/21 126/80  12/30/20 110/68  12/22/20 (!) 100/56   Lab Results  Component Value Date   CREATININE 1.34 (H) 12/22/2020   BUN 14 12/22/2020   NA 147 (H) 12/22/2020   K 4.0 12/22/2020   CL 105 12/22/2020   CO2 24 12/22/2020   Lab Results  Component Value Date   CHOL 128 08/12/2020   HDL 45 08/12/2020   LDLCALC 64 08/12/2020   TRIG 95 08/12/2020   CHOLHDL 2.8 08/12/2020    Past Medical History:  Diagnosis Date  . Asthma   . Cancer ([place])   . CHF (congestive heart failure) ([place])   . Chronic kidney disease   . COPD (chronic obstructive pulmonary disease) ([place])   . Goals of care, counseling/discussion 08/25/2020  . Hyperlipidemia   . Hypertension   . Stage I breast cancer, left ([place]) 08/25/2020  . Stroke [facility])     Past Surgical  History:  Procedure Laterality Date  . stomach surgery  1990    Social History   Socioeconomic History  . Marital status: Widowed    Spouse name: Not on file  . Number of children: Not on file  . Years of education: Not on file  . Highest education level: Not on file  Occupational History  . Not on file  Tobacco Use  . Smoking status: Former Smoker    Packs/day: 0.50    Years: 55.00    Pack years: 27.50    Types: Cigarettes    Start date: 10/17/1963    Quit date: 01/15/2019    Years since quitting: 2.0  . Smokeless tobacco: Never Used  Vaping Use  . Vaping Use: Never used  Substance and Sexual Activity  . Alcohol use: Never  . Drug use: Never  . Sexual activity: Not Currently  Other Topics Concern  . Not on file  Social History Narrative  . Not on file   Social Determinants of Health   Financial Resource Strain: Not on file  Food Insecurity: Not on file  Transportation Needs: Not on file  Physical Activity: Not on file  Stress: Not on file  Social Connections: Not  on file  Intimate Partner Violence: Not on file    Family History  Problem Relation Age of Onset  . Stroke Mother   . Heart disease Mother   . Hyperlipidemia Mother   . Heart disease Maternal Aunt   . Hyperlipidemia Maternal Aunt   . Stroke Maternal Aunt      Immunization History  Administered Date(s) Administered  . Fluad Quad(high Dose 65+) 07/21/2020  . PFIZER(Purple Top)SARS-COV-2 Vaccination 12/12/2019, 12/23/2019, 08/23/2020  . Pneumococcal Polysaccharide-23 10/12/2020    Outpatient Encounter Medications as of 01/20/2021  Medication Sig  . albuterol (ACCUNEB) 1.25 MG/3ML nebulizer solution Take 3 mLs (1.25 mg total) by nebulization every 6 (six) hours as needed for wheezing.  Marland Kitchen albuterol (VENTOLIN HFA) 108 (90 Base) MCG/ACT inhaler Inhale 2 puffs into the lungs every 6 (six) hours as needed. (Patient taking differently: Inhale 2 puffs into the lungs every 6 (six) hours as needed for  wheezing.)  . amLODipine (NORVASC) 10 MG tablet Take 1 tablet by mouth daily for blood pressure  . anastrozole (ARIMIDEX) 1 MG tablet Take 1 tablet (1 mg total) by mouth daily.  Marland Kitchen aspirin 81 MG chewable tablet Chew 81 mg by mouth daily.  Marland Kitchen atorvastatin (LIPITOR) 80 MG tablet Take 1 tablet by mouth daily for cholesterol  . carvedilol (COREG) 25 MG tablet Take 1 tablet by mouth twice daily for blood pressure  . cloNIDine (CATAPRES) 0.2 MG tablet Take 1 tablet by mouth twice daily for blood pressure  . doxazosin (CARDURA) 1 MG tablet Take 1 tablet by mouth twice daily for blood pressure  . Fluticasone-Umeclidin-Vilant (TRELEGY ELLIPTA) 200-62.5-25 MCG/INH AEPB Inhale 1 puff into the lungs daily.  . valsartan (DIOVAN) 160 MG tablet Take 1 tablet by mouth daily for blood pressure  . zolpidem (AMBIEN) 5 MG tablet Take 1 tablet (5 mg total) by mouth at bedtime as needed for sleep.  . [DISCONTINUED] sertraline (ZOLOFT) 25 MG tablet Take 25 mg by mouth daily.  . [DISCONTINUED] traZODone (DESYREL) 50 MG tablet 1-2 tabs po qHS PRN  . ipratropium-albuterol (DUONEB) 0.5-2.5 (3) MG/3ML SOLN Use 3-4 times per day as needed for shortness of breath (Patient not taking: Reported on 01/20/2021)  . sertraline (ZOLOFT) 50 MG tablet Take 1 tablet (50 mg total) by mouth daily.  . [DISCONTINUED] Budeson-Glycopyrrol-Formoterol (BREZTRI AEROSPHERE) 160-9-4.8 MCG/ACT AERO Inhale 2 puffs into the lungs in the morning and at bedtime. (Patient not taking: Reported on 01/20/2021)  . [DISCONTINUED] diltiazem (CARDIZEM CD) 240 MG 24 hr capsule Take 240 mg by mouth daily. (Patient not taking: Reported on 01/20/2021)  . [DISCONTINUED] Vitamin D, Ergocalciferol, (DRISDOL) 1.25 MG (50000 UNIT) CAPS capsule Take 1 capsule (50,000 Units total) by mouth every 7 (seven) days. (Patient not taking: Reported on 01/20/2021)   Facility-Administered Encounter Medications as of 01/20/2021  Medication  . nitroGLYCERIN (NITROSTAT) SL tablet 0.4 mg      ROS: Pertinent positives and negatives noted in HPI. Remainder of ROS non-contributory   No Known Allergies  BP 126/80 (BP Location: Left Arm, Patient Position: Sitting, Cuff Size: Normal)   Pulse 75   Temp 97.7 F (36.5 C) (Temporal)   Ht 5\' 9"  (1.753 m)   Wt 230 lb 9.6 oz (104.6 kg)   SpO2 95%   BMI 34.05 kg/m   Wt Readings from Last 3 Encounters:  01/20/21 230 lb 9.6 oz (104.6 kg)  12/30/20 233 lb 3.2 oz (105.8 kg)  12/22/20 235 lb (106.6 kg)   Temp Readings from Last 3  Encounters:  01/20/21 97.7 F (36.5 C) (Temporal)  12/30/20 (!) 97.3 F (36.3 C) (Temporal)  10/12/20 97.6 F (36.4 C)   BP Readings from Last 3 Encounters:  01/20/21 126/80  12/30/20 110/68  12/22/20 (!) 100/56   Pulse Readings from Last 3 Encounters:  01/20/21 75  12/30/20 78  12/22/20 62     Physical Exam Constitutional:      General: She is not in acute distress.    Appearance: She is obese. She is not ill-appearing.  Cardiovascular:     Rate and Rhythm: Normal rate and regular rhythm.     Pulses: Normal pulses.  Pulmonary:     Effort: Pulmonary effort is normal. No respiratory distress.     Breath sounds: No wheezing, rhonchi or rales.  Musculoskeletal:     Right lower leg: No edema.     Left lower leg: No edema.  Neurological:     Mental Status: She is alert and oriented to person, place, and time.     Motor: No weakness.     Coordination: Coordination normal.     Gait: Gait normal.  Psychiatric:        Behavior: Behavior normal.      A/P:  1. Essential hypertension - controlled, at goal - cont current meds -  norvasc 10mg  daily, coreg 25mg  BID, clonidine 0.2mg  BID, cardura 1mg  BID, valsartan 160mg  daily.   2. Vitamin D deficiency - not taking OTC Vit D - VITAMIN D 25 Hydroxy (Vit-D Deficiency, Fractures)  3. Asthma-COPD overlap syndrome (HCC) - stable, follows with pulm - cont current inhalers and PRN inhaler and neb  4. Coronary artery disease involving  native coronary artery of native heart without angina pectoris - cont lipitor 80mg  daily - Lipid panel - f/u with cardio as scheduled  5. Numbness and tingling of both legs - Hemoglobin A1c - Vitamin B12 - TSH - Iron, TIBC and Ferritin Panel - CBC  6. Psychophysiological insomnia - trazodone up to 100mg  qHS not effective Rx: - zolpidem (AMBIEN) 5 MG tablet; Take 1 tablet (5 mg total) by mouth at bedtime as needed for sleep.  Dispense: 30 tablet; Refill: 1  7. Grief at loss of child 8. Anxiety Increase: - sertraline (ZOLOFT) 50 MG tablet; Take 1 tablet (50 mg total) by mouth daily.  Dispense: 90 tablet; Refill: 1 - increase from 25mg  to 50mg  daily  Discussed plan and reviewed medications with patient, including risks, benefits, and potential side effects. Pt expressed understand. All questions answered.     This visit occurred during the SARS-CoV-2 public health emergency.  Safety protocols were in place, including screening questions prior to the visit, additional usage of staff PPE, and extensive cleaning of exam room while observing appropriate contact time as indicated for disinfecting solutions.

## 2021-01-21 LAB — IRON,TIBC AND FERRITIN PANEL
%SAT: 44 % (calc) (ref 16–45)
Ferritin: 263 ng/mL (ref 16–288)
Iron: 112 ug/dL (ref 45–160)
TIBC: 253 mcg/dL (calc) (ref 250–450)

## 2021-01-25 ENCOUNTER — Encounter: Payer: Self-pay | Admitting: Hematology & Oncology

## 2021-01-25 ENCOUNTER — Other Ambulatory Visit: Payer: Self-pay

## 2021-01-25 ENCOUNTER — Telehealth: Payer: Self-pay

## 2021-01-25 ENCOUNTER — Inpatient Hospital Stay: Payer: Medicare Other | Attending: Hematology & Oncology

## 2021-01-25 ENCOUNTER — Inpatient Hospital Stay: Payer: Medicare Other | Admitting: Hematology & Oncology

## 2021-01-25 DIAGNOSIS — Z79811 Long term (current) use of aromatase inhibitors: Secondary | ICD-10-CM | POA: Diagnosis not present

## 2021-01-25 DIAGNOSIS — C50011 Malignant neoplasm of nipple and areola, right female breast: Secondary | ICD-10-CM

## 2021-01-25 DIAGNOSIS — Z853 Personal history of malignant neoplasm of breast: Secondary | ICD-10-CM

## 2021-01-25 DIAGNOSIS — C50912 Malignant neoplasm of unspecified site of left female breast: Secondary | ICD-10-CM | POA: Insufficient documentation

## 2021-01-25 LAB — CBC WITH DIFFERENTIAL (CANCER CENTER ONLY)
Abs Immature Granulocytes: 0.02 10*3/uL (ref 0.00–0.07)
Basophils Absolute: 0 10*3/uL (ref 0.0–0.1)
Basophils Relative: 0 %
Eosinophils Absolute: 0.2 10*3/uL (ref 0.0–0.5)
Eosinophils Relative: 2 %
HCT: 45.3 % (ref 36.0–46.0)
Hemoglobin: 14.7 g/dL (ref 12.0–15.0)
Immature Granulocytes: 0 %
Lymphocytes Relative: 20 %
Lymphs Abs: 1.7 10*3/uL (ref 0.7–4.0)
MCH: 28.8 pg (ref 26.0–34.0)
MCHC: 32.5 g/dL (ref 30.0–36.0)
MCV: 88.6 fL (ref 80.0–100.0)
Monocytes Absolute: 0.9 10*3/uL (ref 0.1–1.0)
Monocytes Relative: 10 %
Neutro Abs: 5.9 10*3/uL (ref 1.7–7.7)
Neutrophils Relative %: 68 %
Platelet Count: 215 10*3/uL (ref 150–400)
RBC: 5.11 MIL/uL (ref 3.87–5.11)
RDW: 13.9 % (ref 11.5–15.5)
WBC Count: 8.7 10*3/uL (ref 4.0–10.5)
nRBC: 0 % (ref 0.0–0.2)

## 2021-01-25 LAB — CMP (CANCER CENTER ONLY)
ALT: 14 U/L (ref 0–44)
AST: 14 U/L — ABNORMAL LOW (ref 15–41)
Albumin: 3.9 g/dL (ref 3.5–5.0)
Alkaline Phosphatase: 160 U/L — ABNORMAL HIGH (ref 38–126)
Anion gap: 6 (ref 5–15)
BUN: 18 mg/dL (ref 8–23)
CO2: 35 mmol/L — ABNORMAL HIGH (ref 22–32)
Calcium: 10.1 mg/dL (ref 8.9–10.3)
Chloride: 103 mmol/L (ref 98–111)
Creatinine: 1.2 mg/dL — ABNORMAL HIGH (ref 0.44–1.00)
GFR, Estimated: 48 mL/min — ABNORMAL LOW (ref >60)
Glucose, Bld: 120 mg/dL — ABNORMAL HIGH (ref 70–99)
Potassium: 4 mmol/L (ref 3.5–5.1)
Sodium: 144 mmol/L (ref 135–145)
Total Bilirubin: 0.5 mg/dL (ref 0.3–1.2)
Total Protein: 6.5 g/dL (ref 6.5–8.1)

## 2021-01-25 MED ORDER — ANASTROZOLE 1 MG PO TABS: 1.0000 mg | ORAL_TABLET | Freq: Every day | ORAL | 6 refills | Status: DC

## 2021-01-25 NOTE — Progress Notes (Signed)
Hematology and Oncology Follow Up Visit  Jamieka Royle [id number] 1945-11-07 75 y.o. 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 daily     Interim History:  Ms. Mineau is in for follow-up.  This is her second office visit.  Unfortunately, tragically struck the family recently.  Her son committed suicide a couple weeks ago.  I really do not asked too much about this.  I just feel bad for her.  I know that this really is affecting her.  She  lost a daughter many years ago.  Her husband passed away a year or so ago.  There has been no other issues with her.  Her blood pressure is little on the low side.  She is on quite a few different blood pressure medications.  She has had no nausea or vomiting.  Is been no change in bowel or bladder habits.  She has had no cough.  Currently, performance status is ECOG 1.  Medications:  Current Outpatient Medications:  .  albuterol (ACCUNEB) 1.25 MG/3ML nebulizer solution, Take 3 mLs (1.25 mg total) by nebulization every 6 (six) hours as needed for wheezing., Disp: 75 mL, Rfl: 3 .  albuterol (VENTOLIN HFA) 108 (90 Base) MCG/ACT inhaler, Inhale 2 puffs into the lungs every 6 (six) hours as needed. (Patient taking differently: Inhale 2 puffs into the lungs every 6 (six) hours as needed for wheezing.), Disp: 1 each, Rfl: 3 .  amLODipine (NORVASC) 10 MG tablet, Take 1 tablet by mouth daily for blood pressure, Disp: 90 tablet, Rfl: 3 .  anastrozole (ARIMIDEX) 1 MG tablet, Take 1 tablet (1 mg total) by mouth daily., Disp: 90 tablet, Rfl: 3 .  aspirin 81 MG chewable tablet, Chew 81 mg by mouth daily., Disp: , Rfl:  .  atorvastatin (LIPITOR) 80 MG tablet, Take 1 tablet by mouth daily for cholesterol, Disp: 90 tablet, Rfl: 3 .  carvedilol (COREG) 25 MG tablet, Take 1 tablet by mouth twice daily for blood pressure, Disp: 180 tablet, Rfl: 3 .  cloNIDine (CATAPRES)  0.2 MG tablet, Take 1 tablet by mouth twice daily for blood pressure, Disp: 180 tablet, Rfl: 3 .  doxazosin (CARDURA) 1 MG tablet, Take 1 tablet by mouth twice daily for blood pressure, Disp: 180 tablet, Rfl: 1 .  Fluticasone-Umeclidin-Vilant (TRELEGY ELLIPTA) 200-62.5-25 MCG/INH AEPB, Inhale 1 puff into the lungs daily., Disp: 60 each, Rfl: 11 .  sertraline (ZOLOFT) 25 MG tablet, Take 25 mg by mouth daily., Disp: , Rfl:  .  sertraline (ZOLOFT) 50 MG tablet, Take 1 tablet (50 mg total) by mouth daily., Disp: 90 tablet, Rfl: 1 .  traZODone (DESYREL) 50 MG tablet, Take 50 mg by mouth at bedtime as needed for sleep., Disp: , Rfl:  .  valsartan (DIOVAN) 160 MG tablet, Take 1 tablet by mouth daily for blood pressure, Disp: 90 tablet, Rfl: 3 .  zolpidem (AMBIEN) 5 MG tablet, Take 1 tablet (5 mg total) by mouth at bedtime as needed for sleep., Disp: 30 tablet, Rfl: 1 .  Budeson-Glycopyrrol-Formoterol (BREZTRI AEROSPHERE) 160-9-4.8 MCG/ACT AERO, Inhale into the lungs., Disp: , Rfl:  .  ipratropium-albuterol (DUONEB) 0.5-2.5 (3) MG/3ML SOLN, Use 3-4 times per day as needed for shortness of breath (Patient not taking: No sig reported), Disp: 360 mL, Rfl: 5  Current Facility-Administered Medications:  .  nitroGLYCERIN (NITROSTAT) SL tablet 0.4 mg, 0.4 mg, Sublingual, Q5 min  PRN, Donato Heinz, MD, 0.4 mg at 08/11/20 1610  Allergies: No Known Allergies  Past Medical History, Surgical history, Social history, and Family History were reviewed and updated.  Review of Systems: Review of Systems  Constitutional: Negative.   HENT:  Negative.   Eyes: Negative.   Respiratory: Negative.   Cardiovascular: Negative.   Gastrointestinal: Negative.   Endocrine: Negative.   Genitourinary: Negative.    Musculoskeletal: Negative.   Skin: Negative.   Neurological: Negative.   Hematological: Negative.   Psychiatric/Behavioral: Negative.     Physical Exam:  weight is 228 lb 0.6 oz (103.4 kg). Her oral  temperature is 98 F (36.7 C). Her blood pressure is 98/62 and her pulse is 72. Her respiration is 20 and oxygen saturation is 99%.   Wt Readings from Last 3 Encounters:  01/25/21 228 lb 0.6 oz (103.4 kg)  01/20/21 230 lb 9.6 oz (104.6 kg)  12/30/20 233 lb 3.2 oz (105.8 kg)    Physical Exam Vitals reviewed.  Constitutional:      Comments: Breast exam shows right breast with no masses, edema or erythema.  There is no right axillary adenopathy.  Left breast shows a healed lumpectomy scar at the 2 o'clock position.  She has little bit of a keloid at the lumpectomy site.  There are some firmness but no distinct mass at the lumpectomy site.  There is no left axillary adenopathy.  HENT:     Head: Normocephalic and atraumatic.  Eyes:     Pupils: Pupils are equal, round, and reactive to light.  Cardiovascular:     Rate and Rhythm: Normal rate and regular rhythm.     Heart sounds: Normal heart sounds.  Pulmonary:     Effort: Pulmonary effort is normal.     Breath sounds: Normal breath sounds.  Abdominal:     General: Bowel sounds are normal.     Palpations: Abdomen is soft.  Musculoskeletal:        General: No tenderness or deformity. Normal range of motion.     Cervical back: Normal range of motion.  Lymphadenopathy:     Cervical: No cervical adenopathy.  Skin:    General: Skin is warm and dry.     Findings: No erythema or rash.  Neurological:     Mental Status: She is alert and oriented to person, place, and time.  Psychiatric:        Behavior: Behavior normal.        Thought Content: Thought content normal.        Judgment: Judgment normal.      Lab Results  Component Value Date   WBC 8.7 01/25/2021   HGB 14.7 01/25/2021   HCT 45.3 01/25/2021   MCV 88.6 01/25/2021   PLT 215 01/25/2021     Chemistry      Component Value Date/Time   NA 144 01/25/2021 0910   NA 147 (H) 12/22/2020 1234   K 4.0 01/25/2021 0910   CL 103 01/25/2021 0910   CO2 35 (H) 01/25/2021 0910    BUN 18 01/25/2021 0910   BUN 14 12/22/2020 1234   CREATININE 1.20 (H) 01/25/2021 0910      Component Value Date/Time   CALCIUM 10.1 01/25/2021 0910   ALKPHOS 160 (H) 01/25/2021 0910   AST 14 (L) 01/25/2021 0910   ALT 14 01/25/2021 0910   BILITOT 0.5 01/25/2021 0910      Impression and Plan: Ms. Bruski is a very nice 75 year old African-American female.  She  apparently had a early stage carcinoma of the left breast.  Again, I have no records from [place].  I do not see any problems with respect to the cancer recurring.  This is certainly encouraging.  She is on Arimidex.  We will continue her on Arimidex.  I just feel really bad about her son.  I just hate the fact that he took his own life.  I know that this been a very tough time for her.  We will certainly stay strong and prayer for her.  I will plan to get her back in another 6 months for follow-up.  If there are any problems between now and then, she can always come back to see [place].     Volanda Napoleon, MD 4/12/202210:14 AM

## 2021-01-25 NOTE — Telephone Encounter (Signed)
appts made and printed for pt per 01/25/21 los   [employer]

## 2021-01-26 ENCOUNTER — Telehealth: Payer: Self-pay | Admitting: Family Medicine

## 2021-01-26 ENCOUNTER — Ambulatory Visit: Payer: Medicare Other | Admitting: Physician Assistant

## 2021-01-26 ENCOUNTER — Encounter: Payer: Self-pay | Admitting: Physician Assistant

## 2021-01-26 ENCOUNTER — Ambulatory Visit (HOSPITAL_COMMUNITY)
Admission: RE | Admit: 2021-01-26 | Discharge: 2021-01-26 | Disposition: A | Discharge status: Home / Self Care | Payer: Medicare Other | Source: Ambulatory Visit | Attending: Cardiology | Admitting: Cardiology

## 2021-01-26 VITALS — BP 122/82 | HR 78 | Ht 69.0 in | Wt 230.4 lb

## 2021-01-26 DIAGNOSIS — I70223 Atherosclerosis of native arteries of extremities with rest pain, bilateral legs: Secondary | ICD-10-CM | POA: Diagnosis not present

## 2021-01-26 DIAGNOSIS — M79605 Pain in left leg: Secondary | ICD-10-CM

## 2021-01-26 DIAGNOSIS — R1314 Dysphagia, pharyngoesophageal phase: Secondary | ICD-10-CM

## 2021-01-26 DIAGNOSIS — K5900 Constipation, unspecified: Secondary | ICD-10-CM | POA: Diagnosis not present

## 2021-01-26 DIAGNOSIS — Z1211 Encounter for screening for malignant neoplasm of colon: Secondary | ICD-10-CM

## 2021-01-26 DIAGNOSIS — M79604 Pain in right leg: Secondary | ICD-10-CM | POA: Diagnosis not present

## 2021-01-26 DIAGNOSIS — Z1212 Encounter for screening for malignant neoplasm of rectum: Secondary | ICD-10-CM | POA: Diagnosis not present

## 2021-01-26 MED ORDER — CLENPIQ 10-3.5-12 MG-GM -GM/160ML PO SOLN: 320.0000 mL | Freq: Once | ORAL | 0 refills | Status: AC

## 2021-01-26 NOTE — Telephone Encounter (Signed)
Notified patient of lab results.  Patient verbalized understanding.  

## 2021-01-26 NOTE — Telephone Encounter (Signed)
Pt is wanting a cb concerning her most recent lab results. Please advise at [phone].

## 2021-01-26 NOTE — Addendum Note (Signed)
Addended by: Darrall Dears on: 01/26/2021 11:40 AM   Modules accepted: Orders, SmartSet

## 2021-01-26 NOTE — Progress Notes (Addendum)
Chief Complaint: Dysphagia, constipation  HPI:    [name] is a 75 year old female with a past medical history of CHF, CKD and COPD on O2 at night, as well as breast cancer and stroke on aspirin, who was referred to me by Ronnald Nian, DO for a complaint of dysphagia and constipation.      01/25/2021 CBC normal.  CMP with elevated glucose at 120 and creatinine at 1.2 which appears around her baseline.    Today, the patient presents to clinic accompanied by her daughter.  She explains that she has had problems with food getting stuck in her "chest" for the past 3 months.  Tells me that typically it will go down eventually and when it does it will cause her some epigastric discomfort.  Denies any heartburn or reflux.  This has never occurred before for her.  Though she does tell me that she had history of a "bleeding ulcer".  Denies previous EGD.    Also tells me that she has chronic constipation for which she uses 2 stool softeners at night and typically mag citrate in the morning but even with this it is not fully resolved.    Also tells me she had a colonoscopy maybe 4 years ago but "they could not see anything because I had a sandwich that morning".    Denies fever, chills, weight loss, abdominal pain or symptoms that awaken her from sleep.  Past Medical History:  Diagnosis Date  . Asthma   . Cancer ([place])   . CHF (congestive heart failure) ([place])   . Chronic kidney disease   . COPD (chronic obstructive pulmonary disease) ([employer])   . Goals of care, counseling/discussion 08/25/2020  . Hyperlipidemia   . Hypertension   . Stage I breast cancer, left ([place]) 08/25/2020  . Stroke [facility])     Past Surgical History:  Procedure Laterality Date  . stomach surgery  1990    Current Outpatient Medications  Medication Sig Dispense Refill  . albuterol (ACCUNEB) 1.25 MG/3ML nebulizer solution Take 3 mLs (1.25 mg total) by nebulization every 6 (six) hours as needed for wheezing. 75 mL 3  .  albuterol (VENTOLIN HFA) 108 (90 Base) MCG/ACT inhaler Inhale 2 puffs into the lungs every 6 (six) hours as needed. (Patient taking differently: Inhale 2 puffs into the lungs every 6 (six) hours as needed for wheezing.) 1 each 3  . amLODipine (NORVASC) 10 MG tablet Take 1 tablet by mouth daily for blood pressure 90 tablet 3  . anastrozole (ARIMIDEX) 1 MG tablet Take 1 tablet (1 mg total) by mouth daily. 90 tablet 6  . aspirin 81 MG chewable tablet Chew 81 mg by mouth daily.    Marland Kitchen atorvastatin (LIPITOR) 80 MG tablet Take 1 tablet by mouth daily for cholesterol 90 tablet 3  . Budeson-Glycopyrrol-Formoterol (BREZTRI AEROSPHERE) 160-9-4.8 MCG/ACT AERO Inhale into the lungs.    . carvedilol (COREG) 25 MG tablet Take 1 tablet by mouth twice daily for blood pressure 180 tablet 3  . cloNIDine (CATAPRES) 0.2 MG tablet Take 1 tablet by mouth twice daily for blood pressure 180 tablet 3  . doxazosin (CARDURA) 1 MG tablet Take 1 tablet by mouth twice daily for blood pressure 180 tablet 1  . Fluticasone-Umeclidin-Vilant (TRELEGY ELLIPTA) 200-62.5-25 MCG/INH AEPB Inhale 1 puff into the lungs daily. 60 each 11  . ipratropium-albuterol (DUONEB) 0.5-2.5 (3) MG/3ML SOLN Use 3-4 times per day as needed for shortness of breath (Patient not taking: No sig reported) 360  mL 5  . sertraline (ZOLOFT) 25 MG tablet Take 25 mg by mouth daily.    . sertraline (ZOLOFT) 50 MG tablet Take 1 tablet (50 mg total) by mouth daily. 90 tablet 1  . traZODone (DESYREL) 50 MG tablet Take 50 mg by mouth at bedtime as needed for sleep.    . valsartan (DIOVAN) 160 MG tablet Take 1 tablet by mouth daily for blood pressure 90 tablet 3  . zolpidem (AMBIEN) 5 MG tablet Take 1 tablet (5 mg total) by mouth at bedtime as needed for sleep. 30 tablet 1   Current Facility-Administered Medications  Medication Dose Route Frequency Provider Last Rate Last Admin  . nitroGLYCERIN (NITROSTAT) SL tablet 0.4 mg  0.4 mg Sublingual Q5 min PRN Donato Heinz, MD   0.4 mg at 08/11/20 1610    Allergies as of 01/26/2021  . (No Known Allergies)    Family History  Problem Relation Age of Onset  . Stroke Mother   . Heart disease Mother   . Hyperlipidemia Mother   . Heart disease Maternal Aunt   . Hyperlipidemia Maternal Aunt   . Stroke Maternal Aunt     Social History   Socioeconomic History  . Marital status: Widowed    Spouse name: Not on file  . Number of children: Not on file  . Years of education: Not on file  . Highest education level: Not on file  Occupational History  . Not on file  Tobacco Use  . Smoking status: Former Smoker    Packs/day: 0.50    Years: 55.00    Pack years: 27.50    Types: Cigarettes    Start date: 10/17/1963    Quit date: 01/15/2019    Years since quitting: 2.0  . Smokeless tobacco: Never Used  Vaping Use  . Vaping Use: Never used  Substance and Sexual Activity  . Alcohol use: Never  . Drug use: Never  . Sexual activity: Not Currently  Other Topics Concern  . Not on file  Social History Narrative  . Not on file   Social Determinants of Health   Financial Resource Strain: Not on file  Food Insecurity: Not on file  Transportation Needs: Not on file  Physical Activity: Not on file  Stress: Not on file  Social Connections: Not on file  Intimate Partner Violence: Not on file    Review of Systems:    Constitutional: No weight loss, fever or chills Skin: No rash  Cardiovascular: No chest pain Respiratory: No SOB  Gastrointestinal: See HPI and otherwise negative Genitourinary: No dysuria  Neurological: No headache, dizziness or syncope Musculoskeletal: No new muscle or joint pain Hematologic: No bleeding  Psychiatric: No history of depression or anxiety   Physical Exam:  Vital signs: BP 122/82 (BP Location: Left Arm, Patient Position: Sitting, Cuff Size: Large)   Pulse 78   Ht 5\' 9"  (1.753 m)   Wt 230 lb 6.4 oz (104.5 kg)   SpO2 98%   BMI 34.02 kg/m   Constitutional:    Pleasant overweight AA female appears to be in NAD, Well developed, Well nourished, alert and cooperative Head:  Normocephalic and atraumatic. Eyes:   PEERL, EOMI. No icterus. Conjunctiva pink. Ears:  Normal auditory acuity. Neck:  Supple Throat: Oral cavity and pharynx without inflammation, swelling or lesion.  Respiratory: Respirations even and unlabored. Lungs clear to auscultation bilaterally.   No wheezes, crackles, or rhonchi.  Cardiovascular: Normal S1, S2. No MRG. Regular rate and rhythm. No  peripheral edema, cyanosis or pallor.  Gastrointestinal:  Soft, nondistended, nontender. No rebound or guarding. Normal bowel sounds. No appreciable masses or hepatomegaly. Rectal:  Not performed.  Msk:  Symmetrical without gross deformities. Without edema, no deformity or joint abnormality.  Neurologic:  Alert and  oriented x4;  grossly normal neurologically.  Skin:   Dry and intact without significant lesions or rashes. Psychiatric:  Demonstrates good judgement and reason without abnormal affect or behaviors.  RELEVANT LABS AND IMAGING: CBC    Component Value Date/Time   WBC 8.7 01/25/2021 0910   WBC 6.6 01/20/2021 1140   RBC 5.11 01/25/2021 0910   HGB 14.7 01/25/2021 0910   HCT 45.3 01/25/2021 0910   PLT 215 01/25/2021 0910   MCV 88.6 01/25/2021 0910   MCH 28.8 01/25/2021 0910   MCHC 32.5 01/25/2021 0910   RDW 13.9 01/25/2021 0910   LYMPHSABS 1.7 01/25/2021 0910   MONOABS 0.9 01/25/2021 0910   EOSABS 0.2 01/25/2021 0910   BASOSABS 0.0 01/25/2021 0910    CMP     Component Value Date/Time   NA 144 01/25/2021 0910   NA 147 (H) 12/22/2020 1234   K 4.0 01/25/2021 0910   CL 103 01/25/2021 0910   CO2 35 (H) 01/25/2021 0910   GLUCOSE 120 (H) 01/25/2021 0910   BUN 18 01/25/2021 0910   BUN 14 12/22/2020 1234   CREATININE 1.20 (H) 01/25/2021 0910   CALCIUM 10.1 01/25/2021 0910   PROT 6.5 01/25/2021 0910   ALBUMIN 3.9 01/25/2021 0910   AST 14 (L) 01/25/2021 0910   ALT 14  01/25/2021 0910   ALKPHOS 160 (H) 01/25/2021 0910   BILITOT 0.5 01/25/2021 0910   GFRNONAA 48 (L) 01/25/2021 0910   GFRAA 46 (L) 10/18/2020 1106    Assessment: 1.  Dysphagia: For the past 3 months to solids; consider stricture versus dysmotility versus 2.  Constipation: Chronic for the patient, unresolved on 2 soft stool softeners and Mag citrate daily 3.  Screening for colorectal cancer: Sounds like patient had an incomplete colonoscopy 4 years ago, we do not have records  Plan: 1.  Scheduled patient for an EGD with dilation and a screening colonoscopy at the hospital due to nighttime oxygen use.  These are scheduled Dr. Bryan Lemma due to sooner availability.  Did provide the patient with a detailed list of risks for her procedures and she agrees to proceed.  Patient will continue to follow with Dr. Bryan Lemma after time procedures. 2.  Reviewed antidysphagia measures including taking small bites, chewing food well, avoiding distraction while eating and the chin tuck technique. 3.  Would recommend the patient start MiraLAX on a daily basis.  Discussed titration of this up to 4 times a day if necessary. 4.  Patient to follow in clinic per recommendations after time of procedures.  Ellouise Newer, PA-C [facility] 01/26/2021, 10:35 AM  Cc: Ronnald Nian, DO

## 2021-01-26 NOTE — Patient Instructions (Signed)
If you are age 73 or older, your body mass index should be between 23-30. Your Body mass index is 34.02 kg/m. If this is out of the aforementioned range listed, please consider follow up with your Primary Care Provider.  If you are age 57 or younger, your body mass index should be between 19-25. Your Body mass index is 34.02 kg/m. If this is out of the aformentioned range listed, please consider follow up with your Primary Care Provider.   You have been scheduled for an endoscopy and colonoscopy. Please follow the written instructions given to you at your visit today. Please pick up your prep supplies at the pharmacy within the next 1-3 days. If you use inhalers (even only as needed), please bring them with you on the day of your procedure.  Start Miralax once daily.  Thank you for choosing me and [facility].  Ellouise Newer, PA-C

## 2021-02-04 ENCOUNTER — Other Ambulatory Visit (HOSPITAL_COMMUNITY): Payer: Medicare Other

## 2021-02-07 ENCOUNTER — Other Ambulatory Visit: Payer: Self-pay

## 2021-02-07 ENCOUNTER — Ambulatory Visit: Payer: Medicare Other | Admitting: Internal Medicine

## 2021-02-07 ENCOUNTER — Encounter: Payer: Self-pay | Admitting: Internal Medicine

## 2021-02-07 VITALS — BP 136/82 | HR 64 | Ht 69.0 in | Wt 230.4 lb

## 2021-02-07 DIAGNOSIS — I10 Essential (primary) hypertension: Secondary | ICD-10-CM

## 2021-02-07 NOTE — Progress Notes (Signed)
Name: Jessica [name]  MRN/ DOB: [id number], 01-Aug-1946    Age/ Sex: 75 y.o., female    PCP: Jessica Nian, DO   Reason for Endocrinology Evaluation: Elevated normetanephrine's      Date of Initial Endocrinology Evaluation: 02/07/2021     HPI: Ms. Jessica [name] is a 75 y.o. female with a past medical history of HTN, Hx of CVA, COPD, dyslipidemia, Hx of breast ca and CHF The patient presented for initial endocrinology clinic visit on 02/07/2021 for consultative assistance with her elevated normetanephrine's .    During evaluation for hypertensive crisis in 07/2020 she was noted to have an elevated Aldo: renin ratio  As well as elevated 24-hr urinary normetanephrine 729 (131 - 612 ug/24 hr) but normal metanephrines and urinary cortisol.   It appears that these were collected during hospitalizations (10/27-11/11/2019)    while on Carvedilol, clonidine, doxazosin, valsartan  and HCTZ   Today she is accompanied by sister in- law  Has been diagnosed with HTn many years ago , in her 54's.   Substantial weight gain- no  Centripetal obesity- no Easy bruisability no  Severe hypertension- yes  DM- no  Proximal muscle weakness- occasionally  Fatigue- yes  Sudden/ severe headaches? Weight loss- no  Anxiety attacks- no Sweating- at night  Cardiac arrhythmias- no  Palpitations- occasional  Fluid retention- no  Hypokalemia- yes    HISTORY:  Past Medical History:  Past Medical History:  Diagnosis Date  . Asthma   . Cancer ([place])   . CHF (congestive heart failure) ([place])   . Chronic kidney disease   . COPD (chronic obstructive pulmonary disease) ([place])   . Goals of care, counseling/discussion 08/25/2020  . Hyperlipidemia   . Hypertension   . Stage I breast cancer, left ([place]) 08/25/2020  . Stroke [facility])     Past Surgical History:  Past Surgical History:  Procedure Laterality Date  . stomach surgery  1990      Social History:  reports that she quit smoking about 2  years ago. Her smoking use included cigarettes. She started smoking about 57 years ago. She has a 27.50 pack-year smoking history. She has never used smokeless tobacco. She reports that she does not drink alcohol and does not use drugs.  Family History: family history includes Heart disease in her maternal aunt and mother; Hyperlipidemia in her maternal aunt and mother; Stroke in her maternal aunt and mother.   HOME MEDICATIONS: Allergies as of 02/07/2021   No Known Allergies     Medication List       Accurate as of February 07, 2021 12:33 PM. If you have any questions, ask your nurse or doctor.        albuterol 1.25 MG/3ML nebulizer solution Commonly known as: ACCUNEB Take 3 mLs (1.25 mg total) by nebulization every 6 (six) hours as needed for wheezing. What changed: Another medication with the same name was changed. Make sure you understand how and when to take each.   albuterol 108 (90 Base) MCG/ACT inhaler Commonly known as: VENTOLIN HFA Inhale 2 puffs into the lungs every 6 (six) hours as needed. What changed: reasons to take this   amLODipine 10 MG tablet Commonly known as: NORVASC Take 1 tablet by mouth daily for blood pressure   anastrozole 1 MG tablet Commonly known as: ARIMIDEX Take 1 tablet (1 mg total) by mouth daily.   aspirin 81 MG chewable tablet Chew 81 mg by mouth daily.   atorvastatin 80 MG tablet Commonly  known as: LIPITOR Take 1 tablet by mouth daily for cholesterol   Breztri Aerosphere 160-9-4.8 MCG/ACT Aero Generic drug: Budeson-Glycopyrrol-Formoterol Inhale into the lungs.   carvedilol 25 MG tablet Commonly known as: COREG Take 1 tablet by mouth twice daily for blood pressure   cloNIDine 0.2 MG tablet Commonly known as: CATAPRES Take 1 tablet by mouth twice daily for blood pressure   doxazosin 1 MG tablet Commonly known as: CARDURA Take 1 tablet by mouth twice daily for blood pressure   ipratropium-albuterol 0.5-2.5 (3) MG/3ML  Soln Commonly known as: DUONEB Use 3-4 times per day as needed for shortness of breath   sertraline 50 MG tablet Commonly known as: ZOLOFT Take 1 tablet (50 mg total) by mouth daily.   Trelegy Ellipta 200-62.5-25 MCG/INH Aepb Generic drug: Fluticasone-Umeclidin-Vilant Inhale 1 puff into the lungs daily.   valsartan 160 MG tablet Commonly known as: DIOVAN Take 1 tablet by mouth daily for blood pressure   zolpidem 5 MG tablet Commonly known as: AMBIEN Take 1 tablet (5 mg total) by mouth at bedtime as needed for sleep.         REVIEW OF SYSTEMS: A comprehensive ROS was conducted with the patient and is negative except as per HPI and below:  ROS     OBJECTIVE:  VS: BP 136/82   Pulse 64   Ht 5\' 9"  (1.753 m)   Wt 230 lb 6 oz (104.5 kg)   SpO2 98%   BMI 34.02 kg/m     Wt Readings from Last 3 Encounters:  01/26/21 230 lb 6.4 oz (104.5 kg)  01/25/21 228 lb 0.6 oz (103.4 kg)  01/20/21 230 lb 9.6 oz (104.6 kg)     EXAM: General: Pt appears well and is in NAD  Neck: General: Supple without adenopathy. Thyroid: Thyroid size normal.  No goiter or nodules appreciated. No thyroid bruit.  Lungs: Clear with good BS bilat with no rales, rhonchi, or wheezes  Heart: Auscultation: RRR.  Abdomen: Normoactive bowel sounds, soft, nontender, without masses or organomegaly palpable  Extremities:  BL LE: No pretibial edema normal ROM and strength.  Skin: Hair: Texture and amount normal with gender appropriate distribution Skin Inspection: No rashes, acanthosis nigricans/skin tags. No lipohypertrophy Skin Palpation: Skin temperature, texture, and thickness normal to palpation  Neuro: Cranial nerves: II - XII grossly intact  Motor: Normal strength throughout DTRs: 2+ and symmetric in UE without delay in relaxation phase  Mental Status: Judgment, insight: Intact Orientation: Oriented to time, place, and person Mood and affect: No depression, anxiety, or agitation     DATA  REVIEWED:   Results for Jessica, [name] (MRN [id number]) as of 02/08/2021 10:24  Ref. Range 02/07/2021 14:06  Sodium Latest Ref Range: 135 - 145 mEq/L 143  Potassium Latest Ref Range: 3.5 - 5.1 mEq/L 4.1  Chloride Latest Ref Range: 96 - 112 mEq/L 104  CO2 Latest Ref Range: 19 - 32 mEq/L 30  Glucose Latest Ref Range: 70 - 99 mg/dL 82  BUN Latest Ref Range: 6 - 23 mg/dL 22  Creatinine Latest Ref Range: 0.40 - 1.20 mg/dL 1.25 (H)  Calcium Latest Ref Range: 8.4 - 10.5 mg/dL 10.0  GFR Latest Ref Range: >60.00 mL/min 43.09 (L)   Results for Jessica, [name] (MRN [id number]) as of 02/07/2021 12:33  Ref. Range 08/12/2020 13:26  ALDOSTERONE Latest Ref Range: 0.0 - 30.0 ng/dL 10.2  PRA LC/MS/MS Latest Ref Range: 0.167 - 5.380 ng/mL/hr 0.236  ALDO / PRA Ratio Latest Ref Range: 0.0 -  30.0  43.2 (H)   ASSESSMENT/PLAN/RECOMMENDATIONS:   1. Hypertension:    - Pt on clonidine, doxazosin, valsartan and amlodipine  - BP is at goal today  - History of slightly elevated normetanephrine and elevated Aldo: renin ratio. Aldo : renin ratio . It appears that these were drawn during hospitalization for hypertensive crises - Will repeat 24-hr urinary excretion of cortisol and catecholamine  - Will proceed with Aldo and renin check    F/U pending results     Signed electronically by: Mack Guise, MD  [facility] Endocrinology  [facility] [address] [place], [place] [address] Phone: [phone] FAX: [phone]   CC: Jessica [name], [place] [address] Phone: [phone] Fax: [phone]   Return to Endocrinology clinic as below: Future Appointments  Date Time Provider [place]  02/07/2021  1:20 PM Neasia Fleeman, Melanie Crazier, MD [facility] None  03/29/2021  1:20 PM Donato Heinz, MD [facility]  07/27/2021 10:00 AM [facility] None  07/27/2021 10:30 AM [place], Rudell Cobb, MD [facility]  None

## 2021-02-07 NOTE — Patient Instructions (Signed)

## 2021-02-08 LAB — BASIC METABOLIC PANEL
BUN: 22 mg/dL (ref 6–23)
CO2: 30 mEq/L (ref 19–32)
Calcium: 10 mg/dL (ref 8.4–10.5)
Chloride: 104 mEq/L (ref 96–112)
Creatinine, Ser: 1.25 mg/dL — ABNORMAL HIGH (ref 0.40–1.20)
GFR: 43.09 mL/min — ABNORMAL LOW (ref >60.00)
Glucose, Bld: 82 mg/dL (ref 70–99)
Potassium: 4.1 mEq/L (ref 3.5–5.1)
Sodium: 143 mEq/L (ref 135–145)

## 2021-02-08 NOTE — Progress Notes (Signed)
Agree with the assessment and plan as outlined by Jennifer Lemmon, PA-C. ? ?Marinus Eicher, DO, FACG ? ?

## 2021-02-10 ENCOUNTER — Other Ambulatory Visit: Payer: Self-pay

## 2021-02-10 ENCOUNTER — Other Ambulatory Visit: Payer: Medicare Other

## 2021-02-10 DIAGNOSIS — I10 Essential (primary) hypertension: Secondary | ICD-10-CM

## 2021-02-15 LAB — ALDOSTERONE + RENIN ACTIVITY W/ RATIO
ALDOS/RENIN RATIO: 37.3 — ABNORMAL HIGH (ref 0.0–30.0)
ALDOSTERONE: 9.1 ng/dL (ref 0.0–30.0)
Renin: 0.244 ng/mL/hr (ref 0.167–5.380)

## 2021-02-18 ENCOUNTER — Encounter: Payer: Self-pay | Admitting: Internal Medicine

## 2021-03-01 LAB — METANEPHRINES, URINE, 24 HOUR
METANEPHRINE: 43 mcg/24 h — ABNORMAL LOW (ref 90–315)
METANEPHRINES, TOTAL: 213 mcg/24 h — ABNORMAL LOW (ref 224–832)
NORMETANEPHRINE: 170 mcg/24 h (ref 122–676)
Total Volume: 1450 mL

## 2021-03-01 LAB — TEST AUTHORIZATION

## 2021-03-01 LAB — CATECHOLAMINES, FRACTIONATED, URINE, 24 HOUR
Calc Total (E+NE): 21 mcg/24 h — ABNORMAL LOW (ref 26–121)
Creatinine, Urine mg/day-CATEUR: 1.25 g/(24.h) (ref 0.50–2.15)
Dopamine 24 Hr Urine: 177 mcg/24 h (ref 52–480)
Norepinephrine, 24H, Ur: 21 mcg/24 h (ref 15–100)
Total Volume: 1450 mL

## 2021-03-01 LAB — CORTISOL, URINE, 24 HOUR
24 Hour urine volume (VMAHVA): 1450 mL
CREATININE, URINE: 1.15 g/(24.h) (ref 0.50–2.15)
Cortisol (Ur), Free: 12.7 mcg/24 h (ref 4.0–50.0)

## 2021-03-04 ENCOUNTER — Other Ambulatory Visit: Payer: Self-pay

## 2021-03-04 DIAGNOSIS — Z634 Disappearance and death of family member: Secondary | ICD-10-CM

## 2021-03-04 DIAGNOSIS — F419 Anxiety disorder, unspecified: Secondary | ICD-10-CM

## 2021-03-04 DIAGNOSIS — F4321 Adjustment disorder with depressed mood: Secondary | ICD-10-CM

## 2021-03-04 NOTE — Telephone Encounter (Signed)
Last ov 01/20/2021 Last fill 01/20/2021  #90/1 Medication was sent to [facility]

## 2021-03-07 MED ORDER — SERTRALINE HCL 50 MG PO TABS: 50.0000 mg | ORAL_TABLET | Freq: Every day | ORAL | 1 refills | Status: AC

## 2021-03-08 ENCOUNTER — Encounter: Payer: Medicare Other | Admitting: Gastroenterology

## 2021-03-22 ENCOUNTER — Ambulatory Visit (INDEPENDENT_AMBULATORY_CARE_PROVIDER_SITE_OTHER): Payer: Medicare Other | Admitting: *Deleted

## 2021-03-22 ENCOUNTER — Other Ambulatory Visit: Payer: Self-pay | Admitting: *Deleted

## 2021-03-22 DIAGNOSIS — Z0000 Encounter for general adult medical examination without abnormal findings: Secondary | ICD-10-CM | POA: Diagnosis not present

## 2021-03-22 NOTE — Patient Instructions (Addendum)
Jessica [name] , Thank you for taking time to come for your Medicare Wellness Visit. I appreciate your ongoing commitment to your health goals. Please review the following plan we discussed and let me know if I can assist you in the future.   Screening recommendations/referrals: Colonoscopy: scheduled 03-28-2021 Mammogram: patient given number to call and schedule order already placed Bone Density: patient given number to call and schedule order already placed Recommended yearly ophthalmology/optometry visit for glaucoma screening and checkup Recommended yearly dental visit for hygiene and checkup  Vaccinations: Influenza vaccine: up to date Pneumococcal vaccine: up to date Tdap vaccine: Due education provided Shingles vaccine: Due education provided  Advanced directives: Education provided  Conditions/risks  Next appointment: 03-23-2022 @ 10:30    Preventive Care 3 Years and Older, Female Preventive care refers to lifestyle choices and visits with your health care provider that can promote health and wellness. What does preventive care include?  A yearly physical exam. This is also called an annual well check.  Dental exams once or twice a year.  Routine eye exams. Ask your health care provider how often you should have your eyes checked.  Personal lifestyle choices, including:  Daily care of your teeth and gums.  Regular physical activity.  Eating a healthy diet.  Avoiding tobacco and drug use.  Limiting alcohol use.  Practicing safe sex.  Taking low-dose aspirin every day.  Taking vitamin and mineral supplements as recommended by your health care provider. What happens during an annual well check? The services and screenings done by your health care provider during your annual well check will depend on your age, overall health, lifestyle risk factors, and family history of disease. Counseling  Your health care provider may ask you questions about your:  Alcohol  use.  Tobacco use.  Drug use.  Emotional well-being.  Home and relationship well-being.  Sexual activity.  Eating habits.  History of falls.  Memory and ability to understand (cognition).  Work and work Statistician.  Reproductive health. Screening  You may have the following tests or measurements:  Height, weight, and BMI.  Blood pressure.  Lipid and cholesterol levels. These may be checked every 5 years, or more frequently if you are over 7 years old.  Skin check.  Lung cancer screening. You may have this screening every year starting at age 57 if you have a 30-pack-year history of smoking and currently smoke or have quit within the past 15 years.  Fecal occult blood test (FOBT) of the stool. You may have this test every year starting at age 88.  Flexible sigmoidoscopy or colonoscopy. You may have a sigmoidoscopy every 5 years or a colonoscopy every 10 years starting at age 51.  Hepatitis C blood test.  Hepatitis B blood test.  Sexually transmitted disease (STD) testing.  Diabetes screening. This is done by checking your blood sugar (glucose) after you have not eaten for a while (fasting). You may have this done every 1-3 years.  Bone density scan. This is done to screen for osteoporosis. You may have this done starting at age 53.  Mammogram. This may be done every 1-2 years. Talk to your health care provider about how often you should have regular mammograms. Talk with your health care provider about your test results, treatment options, and if necessary, the need for more tests. Vaccines  Your health care provider may recommend certain vaccines, such as:  Influenza vaccine. This is recommended every year.  Tetanus, diphtheria, and acellular pertussis (Tdap, Td)  vaccine. You may need a Td booster every 10 years.  Zoster vaccine. You may need this after age 15.  Pneumococcal 13-valent conjugate (PCV13) vaccine. One dose is recommended after age  65.  Pneumococcal polysaccharide (PPSV23) vaccine. One dose is recommended after age 48. Talk to your health care provider about which screenings and vaccines you need and how often you need them. This information is not intended to replace advice given to you by your health care provider. Make sure you discuss any questions you have with your health care provider. Document Released: 10/29/2015 Document Revised: 06/21/2016 Document Reviewed: 08/03/2015 Elsevier Interactive Patient Education  2017 [employer] Prevention in the Home Falls can cause injuries. They can happen to people of all ages. There are many things you can do to make your home safe and to help prevent falls. What can I do on the outside of my home?  Regularly fix the edges of walkways and driveways and fix any cracks.  Remove anything that might make you trip as you walk through a door, such as a raised step or threshold.  Trim any bushes or trees on the path to your home.  Use bright outdoor lighting.  Clear any walking paths of anything that might make someone trip, such as rocks or tools.  Regularly check to see if handrails are loose or broken. Make sure that both sides of any steps have handrails.  Any raised decks and porches should have guardrails on the edges.  Have any leaves, snow, or ice cleared regularly.  Use sand or salt on walking paths during winter.  Clean up any spills in your garage right away. This includes oil or grease spills. What can I do in the bathroom?  Use night lights.  Install grab bars by the toilet and in the tub and shower. Do not use towel bars as grab bars.  Use non-skid mats or decals in the tub or shower.  If you need to sit down in the shower, use a plastic, non-slip stool.  Keep the floor dry. Clean up any water that spills on the floor as soon as it happens.  Remove soap buildup in the tub or shower regularly.  Attach bath mats securely with double-sided  non-slip rug tape.  Do not have throw rugs and other things on the floor that can make you trip. What can I do in the bedroom?  Use night lights.  Make sure that you have a light by your bed that is easy to reach.  Do not use any sheets or blankets that are too big for your bed. They should not hang down onto the floor.  Have a firm chair that has side arms. You can use this for support while you get dressed.  Do not have throw rugs and other things on the floor that can make you trip. What can I do in the kitchen?  Clean up any spills right away.  Avoid walking on wet floors.  Keep items that you use a lot in easy-to-reach places.  If you need to reach something above you, use a strong step stool that has a grab bar.  Keep electrical cords out of the way.  Do not use floor polish or wax that makes floors slippery. If you must use wax, use non-skid floor wax.  Do not have throw rugs and other things on the floor that can make you trip. What can I do with my stairs?  Do not leave  any items on the stairs.  Make sure that there are handrails on both sides of the stairs and use them. Fix handrails that are broken or loose. Make sure that handrails are as long as the stairways.  Check any carpeting to make sure that it is firmly attached to the stairs. Fix any carpet that is loose or worn.  Avoid having throw rugs at the top or bottom of the stairs. If you do have throw rugs, attach them to the floor with carpet tape.  Make sure that you have a light switch at the top of the stairs and the bottom of the stairs. If you do not have them, ask someone to add them for you. What else can I do to help prevent falls?  Wear shoes that:  Do not have high heels.  Have rubber bottoms.  Are comfortable and fit you well.  Are closed at the toe. Do not wear sandals.  If you use a stepladder:  Make sure that it is fully opened. Do not climb a closed stepladder.  Make sure that both  sides of the stepladder are locked into place.  Ask someone to hold it for you, if possible.  Clearly mark and make sure that you can see:  Any grab bars or handrails.  First and last steps.  Where the edge of each step is.  Use tools that help you move around (mobility aids) if they are needed. These include:  Canes.  Walkers.  Scooters.  Crutches.  Turn on the lights when you go into a dark area. Replace any light bulbs as soon as they burn out.  Set up your furniture so you have a clear path. Avoid moving your furniture around.  If any of your floors are uneven, fix them.  If there are any pets around you, be aware of where they are.  Review your medicines with your doctor. Some medicines can make you feel dizzy. This can increase your chance of falling. Ask your doctor what other things that you can do to help prevent falls. This information is not intended to replace advice given to you by your health care provider. Make sure you discuss any questions you have with your health care provider. Document Released: 07/29/2009 Document Revised: 03/09/2016 Document Reviewed: 11/06/2014 Elsevier Interactive Patient Education  2017 [employer].

## 2021-03-22 NOTE — Progress Notes (Signed)
Subjective:   Jessica [name] is a 75 y.o. female who presents for Medicare Annual (Subsequent) preventive examination.  Virtual Visit via Telephone Note  I connected with  Jessica [name] on 03/22/21 at  9:45 AM EDT by telephone and verified that I am speaking with the correct person using two identifiers.  Location: Patient: Home Provider: Telephone Persons participating in the virtual visit: patient/Nurse Health Advisor   I discussed the limitations, risks, security and privacy concerns of performing an evaluation and management service by telephone and the availability of in person appointments. The patient expressed understanding and agreed to proceed.  Interactive audio and video telecommunications were attempted between this nurse and patient, however failed, due to patient having technical difficulties OR patient did not have access to video capability.  We continued and completed visit with audio only.  Some vital signs may be absent or patient reported.   Jessica Kennedy, LPN   Review of System NA Cardiac Risk Factors include: advanced age (>36men, >41 women);dyslipidemia;hypertension;sedentary lifestyle     Objective:    Today's Vitals   03/22/21 0946  PainSc: 8    There is no height or weight on file to calculate BMI.  Advanced Directives 03/22/2021 01/25/2021 10/05/2020 08/25/2020 08/11/2020  Does Patient Have a Medical Advance Directive? No Yes Yes Yes Yes  Type of Advance Directive - [place];Living will Healthcare Power of [place];Living will [place];Living will  Does patient want to make changes to medical advance directive? - - No - Patient declined - No - Patient declined  Copy of [place] in Chart? - - - No - copy requested No - copy requested  Would patient like information on creating a medical advance directive? No - Patient declined - - - -    Current Medications  (verified) Outpatient Encounter Medications as of 03/22/2021  Medication Sig  . albuterol (ACCUNEB) 1.25 MG/3ML nebulizer solution Take 3 mLs (1.25 mg total) by nebulization every 6 (six) hours as needed for wheezing.  Marland Kitchen albuterol (VENTOLIN HFA) 108 (90 Base) MCG/ACT inhaler Inhale 2 puffs into the lungs every 6 (six) hours as needed. (Patient taking differently: Inhale 2 puffs into the lungs every 6 (six) hours as needed for wheezing.)  . amLODipine (NORVASC) 10 MG tablet Take 1 tablet by mouth daily for blood pressure  . aspirin 81 MG chewable tablet Chew 81 mg by mouth daily.  Marland Kitchen atorvastatin (LIPITOR) 80 MG tablet Take 1 tablet by mouth daily for cholesterol  . carvedilol (COREG) 25 MG tablet Take 1 tablet by mouth twice daily for blood pressure  . cloNIDine (CATAPRES) 0.2 MG tablet Take 1 tablet by mouth twice daily for blood pressure  . doxazosin (CARDURA) 1 MG tablet Take 1 tablet by mouth twice daily for blood pressure  . Fluticasone-Umeclidin-Vilant (TRELEGY ELLIPTA) 200-62.5-25 MCG/INH AEPB Inhale 1 puff into the lungs daily.  Marland Kitchen ipratropium-albuterol (DUONEB) 0.5-2.5 (3) MG/3ML SOLN Use 3-4 times per day as needed for shortness of breath  . sertraline (ZOLOFT) 50 MG tablet Take 1 tablet (50 mg total) by mouth daily.  . valsartan (DIOVAN) 160 MG tablet Take 1 tablet by mouth daily for blood pressure  . zolpidem (AMBIEN) 5 MG tablet Take 1 tablet (5 mg total) by mouth at bedtime as needed for sleep.  Marland Kitchen anastrozole (ARIMIDEX) 1 MG tablet Take 1 tablet (1 mg total) by mouth daily. (Patient not taking: No sig reported)  . Budeson-Glycopyrrol-Formoterol (BREZTRI AEROSPHERE)  160-9-4.8 MCG/ACT AERO Inhale into the lungs. (Patient not taking: Reported on 03/22/2021)   Facility-Administered Encounter Medications as of 03/22/2021  Medication  . nitroGLYCERIN (NITROSTAT) SL tablet 0.4 mg    Allergies (verified) Patient has no known allergies.   History: Past Medical History:  Diagnosis Date  .  Asthma   . Cancer ([place])   . CHF (congestive heart failure) ([place])   . Chronic kidney disease   . COPD (chronic obstructive pulmonary disease) ([place])   . Goals of care, counseling/discussion 08/25/2020  . Hyperlipidemia   . Hypertension   . Stage I breast cancer, left ([place]) 08/25/2020  . Stroke [facility])    Past Surgical History:  Procedure Laterality Date  . stomach surgery  1990   Family History  Problem Relation Age of Onset  . Stroke Mother   . Heart disease Mother   . Hyperlipidemia Mother   . Heart disease Maternal Aunt   . Hyperlipidemia Maternal Aunt   . Stroke Maternal Aunt    Social History   Socioeconomic History  . Marital status: Widowed    Spouse name: Not on file  . Number of children: Not on file  . Years of education: Not on file  . Highest education level: Not on file  Occupational History  . Not on file  Tobacco Use  . Smoking status: Former Smoker    Packs/day: 0.50    Years: 55.00    Pack years: 27.50    Types: Cigarettes    Start date: 10/17/1963    Quit date: 01/15/2019    Years since quitting: 2.1  . Smokeless tobacco: Never Used  Vaping Use  . Vaping Use: Never used  Substance and Sexual Activity  . Alcohol use: Never  . Drug use: Never  . Sexual activity: Not Currently  Other Topics Concern  . Not on file  Social History Narrative  . Not on file   Social Determinants of Health   Financial Resource Strain: Low Risk   . Difficulty of Paying Living Expenses: Not very hard  Food Insecurity: No Food Insecurity  . Worried About Charity fundraiser in the Last Year: Never true  . Ran Out of Food in the Last Year: Never true  Transportation Needs: No Transportation Needs  . Lack of Transportation (Medical): No  . Lack of Transportation (Non-Medical): No  Physical Activity: Inactive  . Days of Exercise per Week: 0 days  . Minutes of Exercise per Session: 0 min  Stress: Stress Concern Present  . Feeling of Stress : To some extent  Social  Connections: Moderately Isolated  . Frequency of Communication with Friends and Family: More than three times a week  . Frequency of Social Gatherings with Friends and Family: Twice a week  . Attends Religious Services: 1 to 4 times per year  . Active Member of Clubs or Organizations: No  . Attends Archivist Meetings: Never  . Marital Status: Widowed    Tobacco Counseling Counseling given: Not Answered   Clinical Intake:  Pre-visit preparation completed: Yes  Pain : 0-10 Pain Score: 8  Pain Type: Chronic pain Pain Location: Leg Pain Orientation: Left,Right Pain Descriptors / Indicators: Aching,Constant Pain Frequency: Intermittent Pain Relieving Factors: tylenol Effect of Pain on Daily Activities: yes  Pain Relieving Factors: tylenol  Nutritional Risks: None Diabetes: No  How often do you need to have someone help you when you read instructions, pamphlets, or other written materials from your doctor or pharmacy?: 1 -  Never  Diabetic? no  Interpreter Needed?: No  Information entered by :: Jessica Kennedy LPN   Activities of Daily Living In your present state of health, do you have any difficulty performing the following activities: 03/22/2021 08/11/2020  Hearing? N N  Vision? N N  Difficulty concentrating or making decisions? N N  Walking or climbing stairs? Y N  Dressing or bathing? N N  Doing errands, shopping? Y N  Comment sister takes to all doctors appointment -  Preparing Food and eating ? N -  Using the Toilet? N -  In the past six months, have you accidently leaked urine? N -  Do you have problems with loss of bowel control? N -  Managing your Medications? N -  Managing your Finances? N -  Housekeeping or managing your Housekeeping? N -  Comment has to go slow with cleaning -  Some recent data might be hidden    Patient Care Team: Ronnald Nian, DO as PCP - General (Family Medicine) Donato Heinz, MD as PCP - Cardiology  (Cardiology)  Indicate any recent Medical Services you may have received from other than [facility] providers in the past year (date may be approximate).     Assessment:   This is a routine wellness examination for Slyvia.  Hearing/Vision screen  Hearing Screening   125Hz  250Hz  500Hz  1000Hz  2000Hz  3000Hz  4000Hz  6000Hz  8000Hz   Right ear:           Left ear:           Comments: Patient states a little trouble .      Vision Screening Comments: Referral placed  Dietary issues and exercise activities discussed: Current Exercise Habits: The patient does not participate in regular exercise at present, Exercise limited by: respiratory conditions(s)  Goals Addressed            This Visit's Progress   . Patient Stated       Would like to gain 10lbs      Depression Screen PHQ 2/9 Scores 03/22/2021  PHQ - 2 Score 6  PHQ- 9 Score 16    Fall Risk Fall Risk  03/22/2021  Falls in the past year? 0  Number falls in past yr: 0  Injury with Fall? 0  Follow up Falls evaluation completed;Falls prevention discussed    FALL RISK PREVENTION PERTAINING TO THE HOME:  Any stairs in or around the home? Yes  If so, are there any without handrails? Yes  Home free of loose throw rugs in walkways, pet beds, electrical cords, etc? Yes  Adequate lighting in your home to reduce risk of falls? Yes   ASSISTIVE DEVICES UTILIZED TO PREVENT FALLS:  Life alert? No  Use of a cane, walker or w/c? No  Grab bars in the bathroom? No  Shower chair or bench in shower? No  Elevated toilet seat or a handicapped toilet? No   TIMED UP AND GO:  Was the test performed? No .      Cognitive Function:  Normal cognitive status assessed by direct observation by this Nurse Health Advisor. No abnormalities found.         Immunizations Immunization History  Administered Date(s) Administered  . Fluad Quad(high Dose 65+) 07/21/2020  . PFIZER(Purple Top)SARS-COV-2 Vaccination 12/12/2019, 12/23/2019, 08/23/2020   . Pneumococcal Polysaccharide-23 10/12/2020    TDAP status: Due, Education has been provided regarding the importance of this vaccine. Advised may receive this vaccine at local pharmacy or Health Dept. Aware to provide a  copy of the vaccination record if obtained from local pharmacy or Health Dept. Verbalized acceptance and understanding.  Flu Vaccine status: Up to date  Pneumococcal vaccine status: Up to date  Covid-19 vaccine status: Completed vaccines  Qualifies for Shingles Vaccine?   Zostavax completed Yes   Shingrix Completed?: No.    Education has been provided regarding the importance of this vaccine. Patient has been advised to call insurance company to determine out of pocket expense if they have not yet received this vaccine. Advised may also receive vaccine at local pharmacy or Health Dept. Verbalized acceptance and understanding.  Screening Tests Health Maintenance  Topic Date Due  . Pneumococcal Vaccine 43-74 Years old (1 of 4 - PCV13) Never done  . Hepatitis C Screening  Never done  . TETANUS/TDAP  Never done  . COLONOSCOPY (Pts 45-51yrs Insurance coverage will need to be confirmed)  Never done  . MAMMOGRAM  Never done  . Zoster Vaccines- Shingrix (1 of 2) Never done  . DEXA SCAN  Never done  . COVID-19 Vaccine (4 - Booster) 11/23/2020  . INFLUENZA VACCINE  05/16/2021  . PNA vac Low Risk Adult (2 of 2 - PCV13) 10/12/2021  . HPV VACCINES  Aged Out    Health Maintenance  Health Maintenance Due  Topic Date Due  . Pneumococcal Vaccine 21-67 Years old (1 of 4 - PCV13) Never done  . Hepatitis C Screening  Never done  . TETANUS/TDAP  Never done  . COLONOSCOPY (Pts 45-27yrs Insurance coverage will need to be confirmed)  Never done  . MAMMOGRAM  Never done  . Zoster Vaccines- Shingrix (1 of 2) Never done  . DEXA SCAN  Never done  . COVID-19 Vaccine (4 - Booster) 11/23/2020    Colonoscopy scheduled for 03-28-2021  Mammogram status: Ordered 03-28-2021 scheduled. Pt  provided with contact info and advised to call to schedule appt.   Bone Density status: Ordered 03-22-2021. Pt provided with contact info and advised to call to schedule appt.  Lung Cancer Screening: (Low Dose CT Chest recommended if Age 59-80 years, 30 pack-year currently smoking OR have quit w/in 15years.) does qualify.   Lung Cancer Screening Referral:  Patient will call back to get referral (has multiple upcoming appointments. Would like to postopone  Additional Screening:  Hepatitis C Screening: does qualify  Vision Screening: Recommended annual ophthalmology exams for early detection of glaucoma and other disorders of the eye. Is the patient up to date with their annual eye exam?  No  Who is the provider or what is the name of the office in which the patient attends annual eye exams? Referral placed If pt is not established with a provider, would they like to be referred to a provider to establish care? Yes .   Dental Screening: Recommended annual dental exams for proper oral hygiene  Community Resource Referral / Chronic Care Management: CRR required this visit?  No   CCM required this visit?  No      Plan:     I have personally reviewed and noted the following in the patient's chart:   . Medical and social history . Use of alcohol, tobacco or illicit drugs  . Current medications and supplements including opioid prescriptions.  . Functional ability and status . Nutritional status . Physical activity . Advanced directives . List of other physicians . Hospitalizations, surgeries, and ER visits in previous 12 months . Vitals . Screenings to include cognitive, depression, and falls . Referrals and appointments  In  addition, I have reviewed and discussed with patient certain preventive protocols, quality metrics, and best practice recommendations. A written personalized care plan for preventive services as well as general preventive health recommendations were provided to  patient.     Jessica Kennedy, LPN   02/20/2517   Nurse Notes:   Patient given number to call for Mammogram and Bone Density.

## 2021-03-23 ENCOUNTER — Encounter (HOSPITAL_COMMUNITY): Payer: Self-pay | Admitting: Gastroenterology

## 2021-03-23 ENCOUNTER — Other Ambulatory Visit: Payer: Medicare Other

## 2021-03-24 ENCOUNTER — Encounter (HOSPITAL_COMMUNITY): Payer: Self-pay | Admitting: Gastroenterology

## 2021-03-24 ENCOUNTER — Other Ambulatory Visit: Payer: Self-pay

## 2021-03-25 ENCOUNTER — Other Ambulatory Visit (HOSPITAL_COMMUNITY)
Admission: RE | Admit: 2021-03-25 | Discharge: 2021-03-25 | Disposition: A | Discharge status: Home / Self Care | Payer: Medicare Other | Source: Ambulatory Visit | Attending: Gastroenterology | Admitting: Gastroenterology

## 2021-03-25 DIAGNOSIS — Z20822 Contact with and (suspected) exposure to covid-19: Secondary | ICD-10-CM | POA: Insufficient documentation

## 2021-03-25 DIAGNOSIS — Z01812 Encounter for preprocedural laboratory examination: Secondary | ICD-10-CM | POA: Diagnosis present

## 2021-03-25 LAB — SARS CORONAVIRUS 2 (TAT 6-24 HRS): SARS Coronavirus 2: NEGATIVE

## 2021-03-27 NOTE — Progress Notes (Signed)
Cardiology Office Note:    Date:  03/29/2021   ID:  Jessica [name], DOB 11/23/1945, MRN [id number]  PCP:  Ronnald Nian, DO  Cardiologist:  Donato Heinz, MD  Electrophysiologist:  None   Referring MD: Ronnald Nian, DO   Chief Complaint  Patient presents with   Hypertension    History of Present Illness:    Jessica [name] is a 75 y.o. female with a hx of CVA, COPD, hypertension, hyperlipidemia, CKD, breast cancer, CAD status post PCI who presents for follow-up.  She was initially seen on 08/11/2020.  She moved to [place] from [place] in July 2021.  Reports a history of CAD status post MI in stent x2.  Reports first MI was in 2010, had stent at [facility].  Second stent was DES to LAD in 2017.  At initial clinic visit on 08/11/2020.  She reported having chest pain and BP was markedly elevated.  It was recommended that she go to the ED and she was admitted to [facility] from 10/27 through 08/17/2020.  Troponins were negative.  She initially required nicardipine drip for hypertension.  Work-up of secondary hypertension included renal artery duplex showing bilateral renal artery stenosis 1 to 59%, normal TSH, elevated aldosterone/renin ratio (43), elevated metanephrines.  Referred to endocrinology for further evaluation.  Echocardiogram on 08/13/2020 showed normal biventricular function, no significant valvular disease.  Zio patch x14 days on 10/04/2020 showed 2 episodes of complete heart block, longest lasting 15 seconds, both occurring at night, frequent PVCs (11.5% of beats), one 4 beat episode of NSVT.  BIs on 01/26/2021 showed right 0.87, left 0.82.  Lower extremity duplex showed 30 to 49% stenosis in the right SFA, total occlusion and distal peroneal artery and left greater than 50% stenosis in distal CFA, 30 to 49% stenosis in SFA.  Since last clinic visit, she reports he has been having some elevated BP at home, up to 150s over 90s.  She denies any chest pain does  report some dyspnea.  Denies any lightheadedness, syncope, or palpitations.  Does report lower extremity edema.  She does report she has been having some pain in her upper right thigh but denies any leg pain with walking.  Wt Readings from Last 3 Encounters:  03/29/21 233 lb 3.2 oz (105.8 kg)  03/24/21 230 lb (104.3 kg)  02/07/21 230 lb 6 oz (104.5 kg)    Past Medical History:  Diagnosis Date   Asthma    Cancer ([place])    CHF (congestive heart failure) (HCC)    Chronic kidney disease    COPD (chronic obstructive pulmonary disease) ([place])    Goals of care, counseling/discussion 08/25/2020   Hyperlipidemia    Hypertension    Pre-diabetes    Sleep apnea    Stage I breast cancer, left ([place]) 08/25/2020   Stroke [facility])     Past Surgical History:  Procedure Laterality Date   stomach surgery  1990    Current Medications: Current Meds  Medication Sig   albuterol (ACCUNEB) 1.25 MG/3ML nebulizer solution Take 3 mLs (1.25 mg total) by nebulization every 6 (six) hours as needed for wheezing.   albuterol (VENTOLIN HFA) 108 (90 Base) MCG/ACT inhaler Inhale 2 puffs into the lungs every 6 (six) hours as needed. (Patient taking differently: Inhale 2 puffs into the lungs every 6 (six) hours as needed for wheezing.)   amLODipine (NORVASC) 10 MG tablet Take 1 tablet by mouth daily for blood pressure (Patient taking differently: Take 10 mg by mouth  in the morning.)   anastrozole (ARIMIDEX) 1 MG tablet Take 1 tablet (1 mg total) by mouth daily. (Patient taking differently: Take 1 mg by mouth in the morning.)   aspirin EC 81 MG tablet Take 81 mg by mouth in the morning. Swallow whole.   atorvastatin (LIPITOR) 80 MG tablet Take 1 tablet by mouth daily for cholesterol (Patient taking differently: Take 80 mg by mouth in the morning.)   Budeson-Glycopyrrol-Formoterol (BREZTRI AEROSPHERE) 160-9-4.8 MCG/ACT AERO Inhale 1 puff into the lungs daily as needed (respiratory issues).   carvedilol (COREG) 25 MG tablet  Take 1 tablet by mouth twice daily for blood pressure (Patient taking differently: Take 25 mg by mouth in the morning and at bedtime.)   CLENPIQ 10-3.5-12 MG-GM -GM/160ML SOLN Take 320 mLs by mouth as directed.   cloNIDine (CATAPRES) 0.2 MG tablet Take 1 tablet by mouth twice daily for blood pressure (Patient taking differently: Take 0.2 mg by mouth 2 (two) times daily.)   doxazosin (CARDURA) 1 MG tablet Take 1 tablet by mouth twice daily for blood pressure (Patient taking differently: Take 1 mg by mouth 2 (two) times daily.)   ipratropium-albuterol (DUONEB) 0.5-2.5 (3) MG/3ML SOLN Use 3-4 times per day as needed for shortness of breath   sertraline (ZOLOFT) 50 MG tablet Take 1 tablet (50 mg total) by mouth daily. (Patient taking differently: Take 50 mg by mouth in the morning.)   traZODone (DESYREL) 50 MG tablet Take 50 mg by mouth at bedtime as needed for sleep.   valsartan (DIOVAN) 160 MG tablet Take 1 tablet by mouth daily for blood pressure (Patient taking differently: Take 160 mg by mouth in the morning.)   zolpidem (AMBIEN) 5 MG tablet Take 1 tablet (5 mg total) by mouth at bedtime as needed for sleep.   Current Facility-Administered Medications for the 03/29/21 encounter (Office Visit) with Donato Heinz, MD  Medication   nitroGLYCERIN (NITROSTAT) SL tablet 0.4 mg     Allergies:   Patient has no known allergies.   Social History   Socioeconomic History   Marital status: Widowed    Spouse name: Not on file   Number of children: Not on file   Years of education: Not on file   Highest education level: Not on file  Occupational History   Not on file  Tobacco Use   Smoking status: Former    Packs/day: 0.50    Years: 55.00    Pack years: 27.50    Types: Cigarettes    Start date: 10/17/1963    Quit date: 01/15/2019    Years since quitting: 2.2   Smokeless tobacco: Never  Vaping Use   Vaping Use: Never used  Substance and Sexual Activity   Alcohol use: Never   Drug use:  Never   Sexual activity: Not Currently  Other Topics Concern   Not on file  Social History Narrative   Not on file   Social Determinants of Health   Financial Resource Strain: Low Risk    Difficulty of Paying Living Expenses: Not very hard  Food Insecurity: No Food Insecurity   Worried About Charity fundraiser in the Last Year: Never true   [place] in the Last Year: Never true  Transportation Needs: No Transportation Needs   Lack of Transportation (Medical): No   Lack of Transportation (Non-Medical): No  Physical Activity: Inactive   Days of Exercise per Week: 0 days   Minutes of Exercise per Session: 0 min  Stress: Stress Concern Present   Feeling of Stress : To some extent  Social Connections: Moderately Isolated   Frequency of Communication with Friends and Family: More than three times a week   Frequency of Social Gatherings with Friends and Family: Twice a week   Attends Religious Services: 1 to 4 times per year   Active Member of [employer] or Organizations: No   Attends Archivist Meetings: Never   Marital Status: Widowed     Family History: The patient's family history includes Heart disease in her maternal aunt and mother; Hyperlipidemia in her maternal aunt and mother; Stroke in her maternal aunt and mother.  ROS:   Please see the history of present illness.     All other systems reviewed and are negative.  EKGs/Labs/Other Studies Reviewed:    The following studies were reviewed today:   EKG:  EKG is ordered today.  The ekg ordered demonstrates normal sinus rhythm, rate 71, PVC, first-degree AV block, left atrial enlargement, poor R wave progression  Recent Labs: 08/11/2020: B Natriuretic Peptide 65.3 12/22/2020: Magnesium 1.8 01/20/2021: TSH 2.02 01/25/2021: ALT 14; Hemoglobin 14.7; Platelet Count 215 02/07/2021: BUN 22; Creatinine, Ser 1.25; Potassium 4.1; Sodium 143  Recent Lipid Panel    Component Value Date/Time   CHOL 138 01/20/2021 1140    TRIG 119.0 01/20/2021 1140   HDL 46.50 01/20/2021 1140   CHOLHDL 3 01/20/2021 1140   VLDL 23.8 01/20/2021 1140   LDLCALC 68 01/20/2021 1140    Physical Exam:    VS:  BP 130/72   Pulse 71   Ht 5\' 9"  (1.753 m)   Wt 233 lb 3.2 oz (105.8 kg)   SpO2 93%   BMI 34.44 kg/m     Wt Readings from Last 3 Encounters:  03/29/21 233 lb 3.2 oz (105.8 kg)  03/24/21 230 lb (104.3 kg)  02/07/21 230 lb 6 oz (104.5 kg)     GEN:  in no acute distress HEENT: Normal NECK: No JVD; No carotid bruits CARDIAC: RRR, no murmurs, rubs, gallops RESPIRATORY:  Clear to auscultation without rales, wheezing or rhonchi  ABDOMEN: Soft, non-tender, non-distended MUSCULOSKELETAL:  No edema; No deformity  SKIN: Warm and dry NEUROLOGIC:  Alert and oriented x 3 PSYCHIATRIC:  Normal affect   ASSESSMENT:    1. Essential hypertension   2. Frequent PVCs   3. CAD in native artery   4. Hyperlipidemia, unspecified hyperlipidemia type   5. PAD (peripheral artery disease) (HCC)   6. Stage 3 chronic kidney disease, unspecified whether stage 3a or 3b CKD (HCC)     PLAN:     Hypertension: On carvedilol 25 mg twice daily, clonidine 0.2 mg twice daily, doxazosin 1 mg BID, amlodipine 10 mg daily, valsartan 160 mg daily. Appears controlled, continue current regimen.  Work-up of secondary hypertension included renal artery duplex showing bilateral renal artery stenosis 1 to 59%, normal TSH, sleep study with mild OSA not meeting criteria for CPAP, elevated aldosterone/renin ratio (43), elevated metanephrines.  Referred to endocrinology for further evaluation. Check BMET.  Frequent PVCs/complete heart block:  Zio patch x14 days on 10/04/2020 showed 2 episodes of complete heart block, lasted 15 seconds, both occurring at night, frequent PVCs (11.5% of beats), one 4 beat episode of NSVT.   -PVC burden 12% of beats despite coreg and diltiazem, but also with short episodes of what appears to be complete heart block.  Referred to  EP, no changes recommended  CAD: Status post MI and stent x2,  last in 2017.   -Continue aspirin 81 mg daily, atorvastatin 80 mg daily  CVA: on aspirin, statin  Hyperlipidemia: LDL 64 on 08/12/2020.  On atorvastatin 80 mg daily  CKD stage III: Creatinine 1.3 on 02/07/2021. Follows with nephrology  OSA: Diagnosed in 2012, reports was never started on CPAP.  Sleep study 10/05/20 with mild OSA not meeting criteria for CPAP, along with overnight desaturations.  Overnight oximetry study showed low SpO2, started on nocturnal O2.  PAD: ABIs on 01/26/2021 showed right 0.87, left 0.82.  Lower extremity duplex showed 30 to 49% stenosis in the right SFA, total occlusion and distal peroneal artery and left greater than 50% stenosis in distal CFA, 30 to 49% stenosis in SFA.  She currently denies any symptoms of claudication, though has not been walking much -Continue aspirin 81 mg daily -Continue atorvastatin 80 mg daily usually -Recommend walking program, goal 30 minutes 5 days/week  Dysphagia: Reports sensation of food getting stuck when she swallows.  Referred o GI for evaluation, underwent EGD which showed esophageal stricture s/p dilatation   RTC in 4 months  Medication Adjustments/Labs and Tests Ordered: Current medicines are reviewed at length with the patient today.  Concerns regarding medicines are outlined above.  Orders Placed This Encounter  Procedures   Basic metabolic panel   EKG 97-JOIT    No orders of the defined types were placed in this encounter.   Patient Instructions  Medication Instructions:  .isntcur  *If you need a refill on your cardiac medications before your next appointment, please call your pharmacy*   Lab Work: BMET today  If you have labs (blood work) drawn today and your tests are completely normal, you will receive your results only by: [place] (if you have MyChart) OR A paper copy in the mail If you have any lab test that is abnormal or we need  to change your treatment, we will call you to review the results.  Follow-Up: At [facility], you and your health needs are our priority.  As part of our continuing mission to provide you with exceptional heart care, we have created designated Provider Care Teams.  These Care Teams include your primary Cardiologist (physician) and Advanced Practice Providers (APPs -  Physician Assistants and Nurse Practitioners) who all work together to provide you with the care you need, when you need it.  We recommend signing up for the patient portal called "MyChart".  Sign up information is provided on this After Visit Summary.  MyChart is used to connect with patients for Virtual Visits (Telemedicine).  Patients are able to view lab/test results, encounter notes, upcoming appointments, etc.  Non-urgent messages can be sent to your provider as well.   To learn more about what you can do with MyChart, go to [email or web address].    Your next appointment:   4 month(s)  The format for your next appointment:   In Person  Provider:   Oswaldo Milian, MD      Signed, Donato Heinz, MD  03/29/2021 1:46 PM    [place]

## 2021-03-28 ENCOUNTER — Other Ambulatory Visit: Payer: Self-pay

## 2021-03-28 ENCOUNTER — Ambulatory Visit (HOSPITAL_COMMUNITY): Payer: Medicare Other | Admitting: Certified Registered Nurse Anesthetist

## 2021-03-28 ENCOUNTER — Encounter (HOSPITAL_COMMUNITY)
Admission: RE | Disposition: A | Discharge status: Home / Self Care | Payer: Self-pay | Source: Home / Self Care | Attending: Gastroenterology

## 2021-03-28 ENCOUNTER — Encounter (HOSPITAL_COMMUNITY): Payer: Self-pay | Admitting: Gastroenterology

## 2021-03-28 ENCOUNTER — Ambulatory Visit (HOSPITAL_COMMUNITY)
Admission: RE | Admit: 2021-03-28 | Discharge: 2021-03-28 | Disposition: A | Discharge status: Home / Self Care | Payer: Medicare Other | Attending: Gastroenterology | Admitting: Gastroenterology

## 2021-03-28 DIAGNOSIS — J449 Chronic obstructive pulmonary disease, unspecified: Secondary | ICD-10-CM | POA: Diagnosis not present

## 2021-03-28 DIAGNOSIS — D122 Benign neoplasm of ascending colon: Secondary | ICD-10-CM | POA: Diagnosis not present

## 2021-03-28 DIAGNOSIS — K635 Polyp of colon: Secondary | ICD-10-CM

## 2021-03-28 DIAGNOSIS — G4730 Sleep apnea, unspecified: Secondary | ICD-10-CM | POA: Insufficient documentation

## 2021-03-28 DIAGNOSIS — K621 Rectal polyp: Secondary | ICD-10-CM

## 2021-03-28 DIAGNOSIS — Z1211 Encounter for screening for malignant neoplasm of colon: Secondary | ICD-10-CM

## 2021-03-28 DIAGNOSIS — K449 Diaphragmatic hernia without obstruction or gangrene: Secondary | ICD-10-CM

## 2021-03-28 DIAGNOSIS — K5900 Constipation, unspecified: Secondary | ICD-10-CM

## 2021-03-28 DIAGNOSIS — R1319 Other dysphagia: Secondary | ICD-10-CM

## 2021-03-28 DIAGNOSIS — N189 Chronic kidney disease, unspecified: Secondary | ICD-10-CM | POA: Insufficient documentation

## 2021-03-28 DIAGNOSIS — Z1212 Encounter for screening for malignant neoplasm of rectum: Secondary | ICD-10-CM

## 2021-03-28 DIAGNOSIS — E785 Hyperlipidemia, unspecified: Secondary | ICD-10-CM | POA: Insufficient documentation

## 2021-03-28 DIAGNOSIS — Z8673 Personal history of transient ischemic attack (TIA), and cerebral infarction without residual deficits: Secondary | ICD-10-CM | POA: Diagnosis not present

## 2021-03-28 DIAGNOSIS — I130 Hypertensive heart and chronic kidney disease with heart failure and stage 1 through stage 4 chronic kidney disease, or unspecified chronic kidney disease: Secondary | ICD-10-CM | POA: Diagnosis not present

## 2021-03-28 DIAGNOSIS — K222 Esophageal obstruction: Secondary | ICD-10-CM

## 2021-03-28 DIAGNOSIS — R1314 Dysphagia, pharyngoesophageal phase: Secondary | ICD-10-CM

## 2021-03-28 DIAGNOSIS — I509 Heart failure, unspecified: Secondary | ICD-10-CM | POA: Diagnosis not present

## 2021-03-28 DIAGNOSIS — K2990 Gastroduodenitis, unspecified, without bleeding: Secondary | ICD-10-CM | POA: Diagnosis not present

## 2021-03-28 DIAGNOSIS — K219 Gastro-esophageal reflux disease without esophagitis: Secondary | ICD-10-CM | POA: Insufficient documentation

## 2021-03-28 DIAGNOSIS — K642 Third degree hemorrhoids: Secondary | ICD-10-CM | POA: Diagnosis not present

## 2021-03-28 DIAGNOSIS — Z853 Personal history of malignant neoplasm of breast: Secondary | ICD-10-CM | POA: Diagnosis not present

## 2021-03-28 DIAGNOSIS — K2970 Gastritis, unspecified, without bleeding: Secondary | ICD-10-CM

## 2021-03-28 DIAGNOSIS — R1310 Dysphagia, unspecified: Secondary | ICD-10-CM | POA: Insufficient documentation

## 2021-03-28 DIAGNOSIS — K5909 Other constipation: Secondary | ICD-10-CM | POA: Diagnosis not present

## 2021-03-28 DIAGNOSIS — D125 Benign neoplasm of sigmoid colon: Secondary | ICD-10-CM | POA: Insufficient documentation

## 2021-03-28 HISTORY — PX: BALLOON DILATION: SHX5330

## 2021-03-28 HISTORY — PX: COLONOSCOPY WITH PROPOFOL: SHX5780

## 2021-03-28 HISTORY — PX: ESOPHAGOGASTRODUODENOSCOPY (EGD) WITH PROPOFOL: SHX5813

## 2021-03-28 HISTORY — DX: Prediabetes: R73.03

## 2021-03-28 HISTORY — DX: Sleep apnea, unspecified: G47.30

## 2021-03-28 HISTORY — PX: BIOPSY: SHX5522

## 2021-03-28 HISTORY — PX: POLYPECTOMY: SHX5525

## 2021-03-28 SURGERY — COLONOSCOPY WITH PROPOFOL
Anesthesia: Monitor Anesthesia Care

## 2021-03-28 MED ORDER — LIDOCAINE HCL (CARDIAC) PF 100 MG/5ML IV SOSY
PREFILLED_SYRINGE | INTRAVENOUS | Status: DC | PRN
  Administered 2021-03-28: 100 mg via INTRAVENOUS

## 2021-03-28 MED ORDER — LACTATED RINGERS IV SOLN
INTRAVENOUS | Status: AC | PRN
  Administered 2021-03-28: 20 mL/h via INTRAVENOUS

## 2021-03-28 MED ORDER — PROPOFOL 500 MG/50ML IV EMUL
INTRAVENOUS | Status: DC | PRN
  Administered 2021-03-28: 120 ug/kg/min via INTRAVENOUS

## 2021-03-28 MED ORDER — PROPOFOL 10 MG/ML IV BOLUS
INTRAVENOUS | Status: DC | PRN
  Administered 2021-03-28 (×3): 10 mg via INTRAVENOUS

## 2021-03-28 MED ORDER — PROPOFOL 1000 MG/100ML IV EMUL
INTRAVENOUS | Status: AC
  Filled 2021-03-28: qty 200

## 2021-03-28 MED ORDER — SODIUM CHLORIDE 0.9 % IV SOLN
INTRAVENOUS | Status: DC
Start: 2021-03-28 — End: 2021-03-28

## 2021-03-28 SURGICAL SUPPLY — 24 items

## 2021-03-28 NOTE — Op Note (Signed)
[facility] Patient Name: Jessica [name] Procedure Date: 03/28/2021 MRN: [id number] Attending MD: Gerrit Heck , MD Date of Birth: 06/01/1946 CSN: [id number] Age: 75 Admit Type: Outpatient Procedure:                Colonoscopy Indications:              Screening for colorectal malignant neoplasm                           Colonoscopy approximatley 2018 at outside facility                            notable for poor prep/incomplete examination. Providers:                Gerrit Heck, MD, Burtis Junes, RN, Tyna Jaksch                            Technician Referring MD:              Medicines:                Monitored Anesthesia Care Complications:            No immediate complications. Estimated Blood Loss:     Estimated blood loss was minimal. Procedure:                Pre-Anesthesia Assessment:                           - Prior to the procedure, a History and Physical                            was performed, and patient medications and                            allergies were reviewed. The patient's tolerance of                            previous anesthesia was also reviewed. The risks                            and benefits of the procedure and the sedation                            options and risks were discussed with the patient.                            All questions were answered, and informed consent                            was obtained. Prior Anticoagulants: The patient has                            taken no previous anticoagulant or antiplatelet                            agents. ASA Grade Assessment: III -  A patient with                            severe systemic disease. After reviewing the risks                            and benefits, the patient was deemed in                            satisfactory condition to undergo the procedure.                           After obtaining informed consent, the colonoscope                            was  passed under direct vision. Throughout the                            procedure, the patient's blood pressure, pulse, and                            oxygen saturations were monitored continuously. The                            CF-HQ190L ([id number] Olympus colonoscope was                            introduced through the anus and advanced to the the                            cecum, identified by appendiceal orifice and                            ileocecal valve. The colonoscopy was performed                            without difficulty. The patient tolerated the                            procedure well. The quality of the bowel                            preparation was good. The ileocecal valve,                            appendiceal orifice, and rectum were photographed. Scope In: 8:06:46 AM Scope Out: 8:26:42 AM Scope Withdrawal Time: 0 hours 16 minutes 17 seconds  Total Procedure Duration: 0 hours 19 minutes 56 seconds  Findings:      Hemorrhoids were found on perianal exam.      Four sessile polyps were found in the sigmoid colon, descending colon       and ascending colon. The polyps were 3 to 5 mm in size. These polyps       were removed with a cold snare. Resection and retrieval were complete.  Estimated blood loss was minimal.      A 2 mm polyp was found in the rectum. The polyp was sessile. The polyp       was removed with a cold snare. Resection and retrieval were complete.       Estimated blood loss was minimal.      Non-bleeding internal hemorrhoids were found during retroflexion. The       hemorrhoids were Grade III (internal hemorrhoids that prolapse but       require manual reduction). Impression:               - Hemorrhoids found on perianal exam.                           - Four 3 to 5 mm polyps in the sigmoid colon, in                            the descending colon and in the ascending colon,                            removed with a cold snare. Resected and  retrieved.                           - One 2 mm polyp in the rectum, removed with a cold                            snare. Resected and retrieved.                           - Non-bleeding internal hemorrhoids. Moderate Sedation:      Not Applicable - Patient had care per Anesthesia. Recommendation:           - Patient has a contact number available for                            emergencies. The signs and symptoms of potential                            delayed complications were discussed with the                            patient. Return to normal activities tomorrow.                            Written discharge instructions were provided to the                            patient.                           - Resume previous diet.                           - Continue present medications.                           -  Await pathology results.                           - Repeat colonoscopy for surveillance based on                            pathology results.                           - Return to GI clinic PRN. Procedure Code(s):        --- Professional ---                           [phone], Colonoscopy, flexible; with removal of                            tumor(s), polyp(s), or other lesion(s) by snare                            technique Diagnosis Code(s):        --- Professional ---                           Z12.11, Encounter for screening for malignant                            neoplasm of colon                           K64.2, Third degree hemorrhoids                           K63.5, Polyp of colon                           K62.1, Rectal polyp CPT copyright 2019 American Medical Association. All rights reserved. The codes documented in this report are preliminary and upon coder review may  be revised to meet current compliance requirements. Gerrit Heck, MD 03/28/2021 8:42:23 AM Number of Addenda: 0

## 2021-03-28 NOTE — Transfer of Care (Signed)
Immediate Anesthesia Transfer of Care Note  Patient: Jessica [name]  Procedure(s) Performed: COLONOSCOPY WITH PROPOFOL ESOPHAGOGASTRODUODENOSCOPY (EGD) WITH PROPOFOL POLYPECTOMY  Patient Location: Endoscopy Unit  Anesthesia Type:MAC  Level of Consciousness: awake, alert , oriented, drowsy and patient cooperative  Airway & Oxygen Therapy: Patient Spontanous Breathing and Patient connected to face mask oxygen  Post-op Assessment: Report given to RN and Post -op Vital signs reviewed and stable  Post vital signs: Reviewed and stable  Last Vitals:  Vitals Value Taken Time  BP 159/98 0832  Temp    Pulse 87   Resp    SpO2 100%     Last Pain:  Vitals:   03/28/21 0714  TempSrc: Oral  PainSc: 0-No pain         Complications: No notable events documented.

## 2021-03-28 NOTE — H&P (Signed)
     Chief Complaint:    Dysphagia, colon cancer screening, chronic constipation  HPI:     Patient is a 75 y.o. female presenting to [facility] long hospital endoscopy unit for upper endoscopy and colonoscopy.  She was seen by Ellouise Newer on 01/26/2021 with complaint of solid food dysphagia since approximately January 2022.  No issue tolerating liquids.  No history of reflux.  No prior similar symptoms and no previous EGD.  No food impactions, but symptoms progressively worsening over the last few months.  Also with a history of chronic constipation, incompletely controlled with stool softeners and mag citrate.  Was started on MiraLAX with clinical improvement.  No hematochezia.  Her only colonoscopy was approximately 2018 but notable for poor prep "because I ate a sandwich that morning".  Presents today for colonoscopy for routine screening.   Review of systems:     No chest pain, no SOB, no fevers, no urinary sx   Past Medical History:  Diagnosis Date   Asthma    Cancer ([place])    CHF (congestive heart failure) (HCC)    Chronic kidney disease    COPD (chronic obstructive pulmonary disease) ([place])    Goals of care, counseling/discussion 08/25/2020   Hyperlipidemia    Hypertension    Pre-diabetes    Sleep apnea    Stage I breast cancer, left ([place]) 08/25/2020   Stroke ([place])     Patient's surgical history, family medical history, social history, medications and allergies were all reviewed in Epic    Current Facility-Administered Medications  Medication Dose Route Frequency Provider Last Rate Last Admin   0.9 %  sodium chloride infusion   Intravenous Continuous Levin Erp, PA        Physical Exam:     BP (!) 142/79   Pulse 81   Temp 98.4 F (36.9 C) (Oral)   Resp 12   Ht 5\' 9"  (1.753 m)   Wt 104.3 kg   SpO2 95%   BMI 33.97 kg/m   GENERAL:  Pleasant female in NAD PSYCH: : Cooperative, normal affect EENT:  conjunctiva pink, mucous membranes moist, neck supple  without masses CARDIAC:  RRR, no murmur heard, no peripheral edema PULM: Normal respiratory effort, lungs CTA bilaterally, no wheezing ABDOMEN:  Nondistended, soft, nontender. No obvious masses, no hepatomegaly,  normal bowel sounds SKIN:  turgor, no lesions seen Musculoskeletal:  Normal muscle tone, normal strength NEURO: Alert and oriented x 3, no focal neurologic deficits   IMPRESSION and PLAN:    1) Dysphagia - EGD with esophageal dilation and biopsies as appropriate  2) Colon cancer screening - Colonoscopy today - Poor prep on only previous colonoscopy approximately 4 years ago  3) Chronic constipation - Improved with MiraLAX - Evaluate for mucosal/luminal pathology colonoscopy today  4) History of COPD on supplemental O2 5) CHF 6) History of CVA - Procedures scheduled at [facility] due to elevated periprocedural risks - Has been holding ASA 81 mg x 5 days          Lavena Bullion ,DO, FACG 03/28/2021, 7:16 AM

## 2021-03-28 NOTE — Discharge Instructions (Addendum)
YOU HAD AN ENDOSCOPIC PROCEDURE TODAY: Refer to the procedure report and other information in the discharge instructions given to you for any specific questions about what was found during the examination. If this information does not answer your questions, please call [place] office at [phone] to clarify.   YOU SHOULD EXPECT: Some feelings of bloating in the abdomen. Passage of more gas than usual. Walking can help get rid of the air that was put into your GI tract during the procedure and reduce the bloating. If you had a lower endoscopy (such as a colonoscopy or flexible sigmoidoscopy) you may notice spotting of blood in your stool or on the toilet paper. Some abdominal soreness may be present for a day or two, also.  DIET: Your first meal following the procedure should be a light meal and then it is ok to progress to your normal diet. A half-sandwich or bowl of soup is an example of a good first meal. Heavy or fried foods are harder to digest and may make you feel nauseous or bloated. Drink plenty of fluids but you should avoid alcoholic beverages for 24 hours. If you had a esophageal dilation, please eat soft foods today and advance to regular diet tomorrow.  ACTIVITY: Your care partner should take you home directly after the procedure. You should plan to take it easy, moving slowly for the rest of the day. You can resume normal activity the day after the procedure however YOU SHOULD NOT DRIVE, use power tools, machinery or perform tasks that involve climbing or major physical exertion for 24 hours (because of the sedation medicines used during the test).   SYMPTOMS TO REPORT IMMEDIATELY: A gastroenterologist can be reached at any hour. Please call [phone]  for any of the following symptoms:  Following lower endoscopy (colonoscopy, flexible sigmoidoscopy) Excessive amounts of blood in the stool  Significant tenderness, worsening of abdominal pains  Swelling of the abdomen that is new,  acute  Fever of 100 or higher  Following upper endoscopy (EGD, EUS, ERCP, esophageal dilation) Vomiting of blood or coffee ground material  New, significant abdominal pain  New, significant chest pain or pain under the shoulder blades  Painful or persistently difficult swallowing  New shortness of breath  Black, tarry-looking or red, bloody stools  FOLLOW UP:  If any biopsies were taken you will be contacted by phone or by letter within the next 1-3 weeks. Call [phone]  if you have not heard about the biopsies in 3 weeks.  Please also call with any specific questions about appointments or follow up tests.

## 2021-03-28 NOTE — Anesthesia Preprocedure Evaluation (Signed)
Anesthesia Evaluation  Patient identified by MRN, date of birth, ID band Patient awake    Reviewed: Allergy & Precautions, NPO status , Patient's Chart, lab work & pertinent test results, reviewed documented beta blocker date and time   Airway Mallampati: I       Dental  (+) Partial Lower, Edentulous Upper, Poor Dentition   Pulmonary former smoker,    Pulmonary exam normal        Cardiovascular hypertension, Pt. on medications and Pt. on home beta blockers Normal cardiovascular exam     Neuro/Psych PSYCHIATRIC DISORDERS Anxiety    GI/Hepatic negative GI ROS, Neg liver ROS,   Endo/Other  negative endocrine ROS  Renal/GU   negative genitourinary   Musculoskeletal negative musculoskeletal ROS (+)   Abdominal (+) + obese,   Peds  Hematology negative hematology ROS (+)   Anesthesia Other Findings   Reproductive/Obstetrics                             Anesthesia Physical Anesthesia Plan  ASA: 3  Anesthesia Plan: MAC   Post-op Pain Management:    Induction:   PONV Risk Score and Plan: Propofol infusion and TIVA  Airway Management Planned: Natural Airway and Mask  Additional Equipment: None  Intra-op Plan:   Post-operative Plan:   Informed Consent: I have reviewed the patients History and Physical, chart, labs and discussed the procedure including the risks, benefits and alternatives for the proposed anesthesia with the patient or authorized representative who has indicated his/her understanding and acceptance.     Dental advisory given  Plan Discussed with:   Anesthesia Plan Comments:         Anesthesia Quick Evaluation

## 2021-03-28 NOTE — Interval H&P Note (Signed)
History and Physical Interval Note:  03/28/2021 7:19 AM  Jessica [name]  has presented today for surgery, with the diagnosis of Dysphagia , Screening for colorectal cancer.  The various methods of treatment have been discussed with the patient and family. After consideration of risks, benefits and other options for treatment, the patient has consented to  Procedure(s): COLONOSCOPY WITH PROPOFOL (N/A) ESOPHAGOGASTRODUODENOSCOPY (EGD) WITH PROPOFOL (N/A) as a surgical intervention.  The patient's history has been reviewed, patient examined, no change in status, stable for surgery.  I have reviewed the patient's chart and labs.  Questions were answered to the patient's satisfaction.     Dominic Pea Vartan Kerins

## 2021-03-28 NOTE — Op Note (Signed)
[facility] Patient Name: Jessica [name] Procedure Date: 03/28/2021 MRN: [id number] Attending MD: Gerrit Heck , MD Date of Birth: [birth year] CSN: [id number] Age: 75 Admit Type: Outpatient Procedure:                Upper GI endoscopy Indications:              Dysphagia Providers:                Gerrit Heck, MD, Burtis Junes, RN, Tyna Jaksch                            Technician Referring MD:              Medicines:                Monitored Anesthesia Care Complications:            No immediate complications. Estimated Blood Loss:     Estimated blood loss was minimal. Procedure:                Pre-Anesthesia Assessment:                           - Prior to the procedure, a History and Physical                            was performed, and patient medications and                            allergies were reviewed. The patient's tolerance of                            previous anesthesia was also reviewed. The risks                            and benefits of the procedure and the sedation                            options and risks were discussed with the patient.                            All questions were answered, and informed consent                            was obtained. Prior Anticoagulants: The patient has                            taken no previous anticoagulant or antiplatelet                            agents except for aspirin. ASA Grade Assessment:                            III - A patient with severe systemic disease. After                            reviewing  the risks and benefits, the patient was                            deemed in satisfactory condition to undergo the                            procedure.                           After obtaining informed consent, the endoscope was                            passed under direct vision. Throughout the                            procedure, the patient's blood pressure, pulse, and                             oxygen saturations were monitored continuously. The                            GIF-H190 ([id number]) Olympus gastroscope was                            introduced through the mouth, and advanced to the                            second part of duodenum. The upper GI endoscopy was                            accomplished without difficulty. The patient                            tolerated the procedure well. Scope In: Scope Out: Findings:      A 2 cm sliding type hiatal hernia was present.      One benign-appearing, intrinsic mild stenosis was found 42 cm from the       incisors. This stenosis measured 1 cm (in length). The stenosis was       traversed. A TTS dilator was passed through the scope. Dilation with an       18-19-20 mm balloon dilator was performed to 19 mm. The dilation site       was examined and showed mild mucosal disruption and moderate improvement       in luminal narrowing. This was biopsied with a cold forceps for dual       purposes of histology and further fracturing of the ring. Estimated       blood loss was minimal.      The gastroesophageal flap valve was visualized endoscopically and       classified as Hill Grade II (fold present, opens with respiration).      Scattered mild inflammation characterized by erythema was found in the       pre-pyloric antrum. Biopsies were taken with a cold forceps for       histology. Estimated blood loss was minimal.      The examined duodenum was normal. Impression:               -  2 cm hiatal hernia.                           - Benign-appearing esophageal stenosis. Dilated.                            Biopsied.                           - Gastroesophageal flap valve classified as Hill                            Grade II (fold present, opens with respiration).                           - Gastritis. Biopsied.                           - Normal examined duodenum. Moderate Sedation:      Not Applicable - Patient had  care per Anesthesia. Recommendation:           - Patient has a contact number available for                            emergencies. The signs and symptoms of potential                            delayed complications were discussed with the                            patient. Return to normal activities tomorrow.                            Written discharge instructions were provided to the                            patient.                           - Soft diet today, then advance as tolerated to                            previous diet tomorrow.                           - Continue present medications.                           - Await pathology results.                           - Repeat upper endoscopy PRN for retreatment. Procedure Code(s):        --- Professional ---                           [phone], Esophagogastroduodenoscopy, flexible,  transoral; with transendoscopic balloon dilation of                            esophagus (less than 30 mm diameter)                           43239, 59, Esophagogastroduodenoscopy, flexible,                            transoral; with biopsy, single or multiple Diagnosis Code(s):        --- Professional ---                           K44.9, Diaphragmatic hernia without obstruction or                            gangrene                           K22.2, Esophageal obstruction                           K29.70, Gastritis, unspecified, without bleeding                           R13.10, Dysphagia, unspecified CPT copyright 2019 [employer]. All rights reserved. The codes documented in this report are preliminary and upon coder review may  be revised to meet current compliance requirements. Gerrit Heck, MD 03/28/2021 8:39:12 AM Number of Addenda: 0

## 2021-03-28 NOTE — Anesthesia Postprocedure Evaluation (Signed)
Anesthesia Post Note  Patient: Scientist, research (medical)  Procedure(s) Performed: COLONOSCOPY WITH PROPOFOL ESOPHAGOGASTRODUODENOSCOPY (EGD) WITH PROPOFOL POLYPECTOMY     Patient location during evaluation: Endoscopy Anesthesia Type: MAC Level of consciousness: awake Pain management: pain level controlled Vital Signs Assessment: post-procedure vital signs reviewed and stable Respiratory status: spontaneous breathing Cardiovascular status: stable Postop Assessment: no apparent nausea or vomiting Anesthetic complications: no   No notable events documented.  Last Vitals:  Vitals:   03/28/21 0840 03/28/21 0850  BP: (!) 162/92 (!) 185/93  Pulse: (!) 40 71  Resp: 12 (!) 22  Temp:    SpO2: 97% 93%    Last Pain:  Vitals:   03/28/21 0850  TempSrc:   PainSc: 0-No pain   Pain Goal:                   Huston Foley

## 2021-03-29 ENCOUNTER — Encounter: Payer: Self-pay | Admitting: Cardiology

## 2021-03-29 ENCOUNTER — Ambulatory Visit: Payer: Medicare Other | Admitting: Cardiology

## 2021-03-29 VITALS — BP 130/72 | HR 71 | Ht 69.0 in | Wt 233.2 lb

## 2021-03-29 DIAGNOSIS — E785 Hyperlipidemia, unspecified: Secondary | ICD-10-CM | POA: Diagnosis not present

## 2021-03-29 DIAGNOSIS — I10 Essential (primary) hypertension: Secondary | ICD-10-CM

## 2021-03-29 DIAGNOSIS — I739 Peripheral vascular disease, unspecified: Secondary | ICD-10-CM

## 2021-03-29 DIAGNOSIS — I493 Ventricular premature depolarization: Secondary | ICD-10-CM | POA: Diagnosis not present

## 2021-03-29 DIAGNOSIS — I2510 Atherosclerotic heart disease of native coronary artery without angina pectoris: Secondary | ICD-10-CM

## 2021-03-29 DIAGNOSIS — N183 Chronic kidney disease, stage 3 unspecified: Secondary | ICD-10-CM

## 2021-03-29 NOTE — Patient Instructions (Addendum)
Medication Instructions:  Your physician recommends that you continue on your current medications as directed. Please refer to the Current Medication list given to you today.  *If you need a refill on your cardiac medications before your next appointment, please call your pharmacy*   Lab Work: BMET today  If you have labs (blood work) drawn today and your tests are completely normal, you will receive your results only by: [place] (if you have MyChart) OR A paper copy in the mail If you have any lab test that is abnormal or we need to change your treatment, we will call you to review the results.  Follow-Up: At [facility], you and your health needs are our priority.  As part of our continuing mission to provide you with exceptional heart care, we have created designated Provider Care Teams.  These Care Teams include your primary Cardiologist (physician) and Advanced Practice Providers (APPs -  Physician Assistants and Nurse Practitioners) who all work together to provide you with the care you need, when you need it.  We recommend signing up for the patient portal called "MyChart".  Sign up information is provided on this After Visit Summary.  MyChart is used to connect with patients for Virtual Visits (Telemedicine).  Patients are able to view lab/test results, encounter notes, upcoming appointments, etc.  Non-urgent messages can be sent to your provider as well.   To learn more about what you can do with MyChart, go to [email or web address].    Your next appointment:   4 month(s)  The format for your next appointment:   In Person  Provider:   Oswaldo Milian, MD

## 2021-03-30 ENCOUNTER — Encounter (HOSPITAL_COMMUNITY): Payer: Self-pay | Admitting: Gastroenterology

## 2021-03-30 LAB — BASIC METABOLIC PANEL
BUN/Creatinine Ratio: 12 (ref 12–28)
BUN: 14 mg/dL (ref 8–27)
CO2: 30 mmol/L — ABNORMAL HIGH (ref 20–29)
Calcium: 10 mg/dL (ref 8.7–10.3)
Chloride: 101 mmol/L (ref 96–106)
Creatinine, Ser: 1.17 mg/dL — ABNORMAL HIGH (ref 0.57–1.00)
Glucose: 74 mg/dL (ref 65–99)
Potassium: 3.9 mmol/L (ref 3.5–5.2)
Sodium: 146 mmol/L — ABNORMAL HIGH (ref 134–144)
eGFR: 50 mL/min/{1.73_m2} — ABNORMAL LOW (ref >59)

## 2021-03-30 LAB — SURGICAL PATHOLOGY

## 2021-03-31 ENCOUNTER — Encounter: Payer: Self-pay | Admitting: Gastroenterology

## 2021-04-06 ENCOUNTER — Encounter: Payer: Self-pay | Admitting: *Deleted

## 2021-04-26 ENCOUNTER — Ambulatory Visit (INDEPENDENT_AMBULATORY_CARE_PROVIDER_SITE_OTHER): Payer: Medicare Other

## 2021-04-26 ENCOUNTER — Other Ambulatory Visit: Payer: Medicare Other

## 2021-04-26 ENCOUNTER — Other Ambulatory Visit: Payer: Self-pay

## 2021-04-26 DIAGNOSIS — M5441 Lumbago with sciatica, right side: Secondary | ICD-10-CM

## 2021-04-26 DIAGNOSIS — G8929 Other chronic pain: Secondary | ICD-10-CM

## 2021-04-26 DIAGNOSIS — Z6834 Body mass index (BMI) 34.0-34.9, adult: Secondary | ICD-10-CM | POA: Diagnosis not present

## 2021-04-26 DIAGNOSIS — E6609 Other obesity due to excess calories: Secondary | ICD-10-CM | POA: Diagnosis not present

## 2021-04-26 DIAGNOSIS — M5442 Lumbago with sciatica, left side: Secondary | ICD-10-CM | POA: Diagnosis not present

## 2021-05-26 ENCOUNTER — Telehealth: Payer: Self-pay | Admitting: *Deleted

## 2021-05-26 ENCOUNTER — Other Ambulatory Visit: Payer: Self-pay | Admitting: Cardiology

## 2021-05-26 NOTE — Telephone Encounter (Signed)
ERROR

## 2021-06-13 IMAGING — CT CT HEAD W/O CM
3 of 4 series · 15 of 47 positions shown, 18 images · non-contrast
Comparison: None.

CLINICAL DATA: Headache

EXAM:
CT HEAD WITHOUT CONTRAST
TECHNIQUE: Contiguous axial images were obtained from the base of the skull
through the vertex without intravenous contrast.

[Series 2: head wo · axial · 0.47mm/px · z∈[-147,-7]mm · 9 of 34 slices shown, 12 images]
[im 3/34  brain]
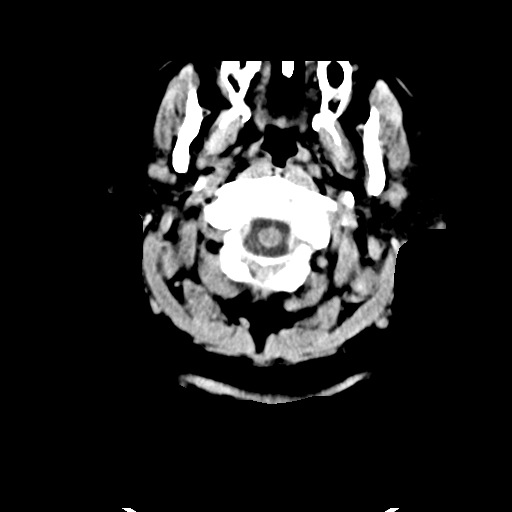
[im 3/34  bone]
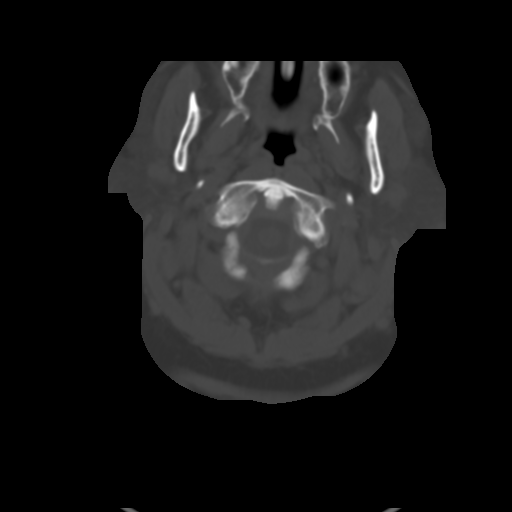
[im 8/34  brain]
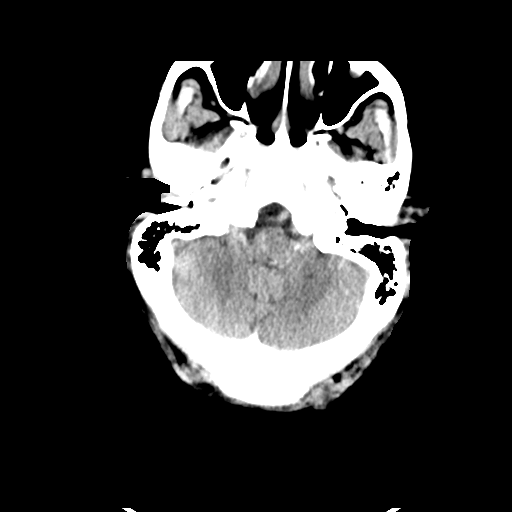
[im 10/34  brain]
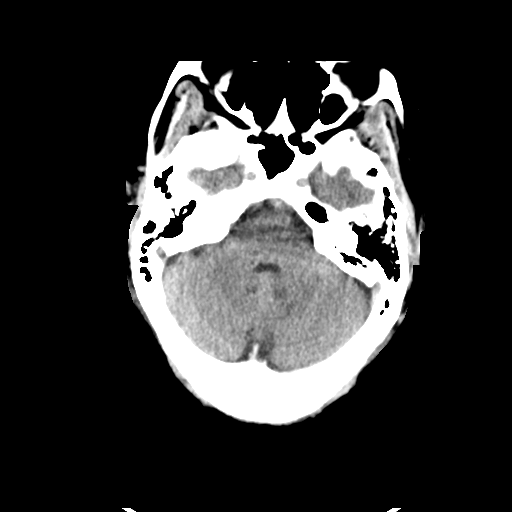
[im 15/34  brain]
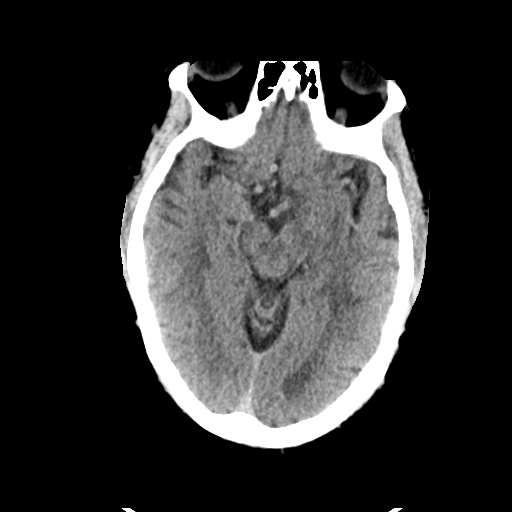
[im 17/34  brain]
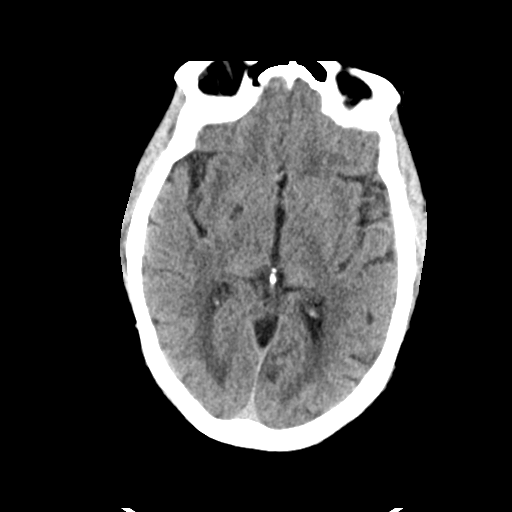
[im 17/34  bone]
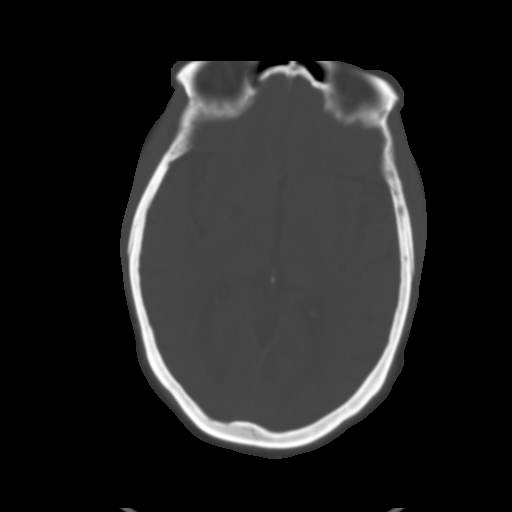
[im 19/34  brain]
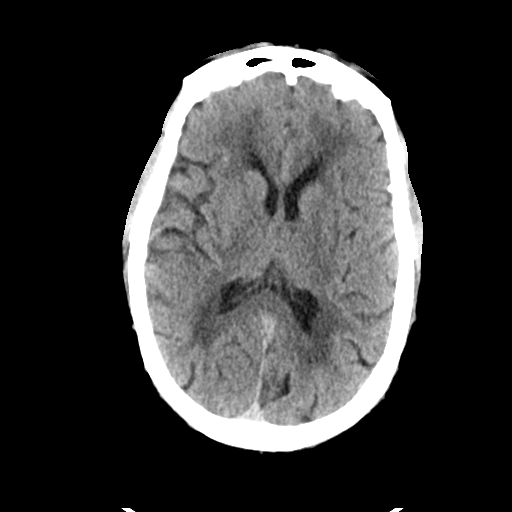
[im 24/34  brain]
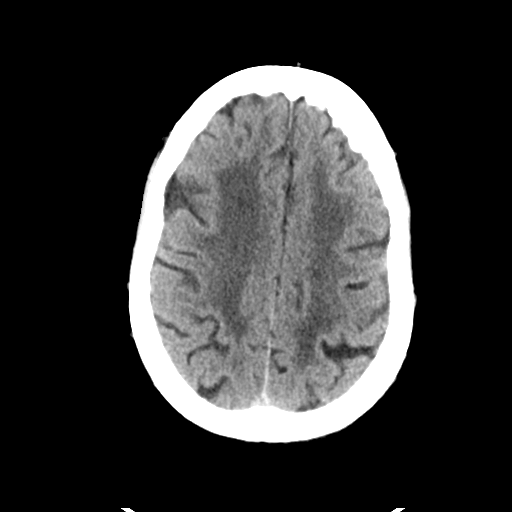
[im 26/34  brain]
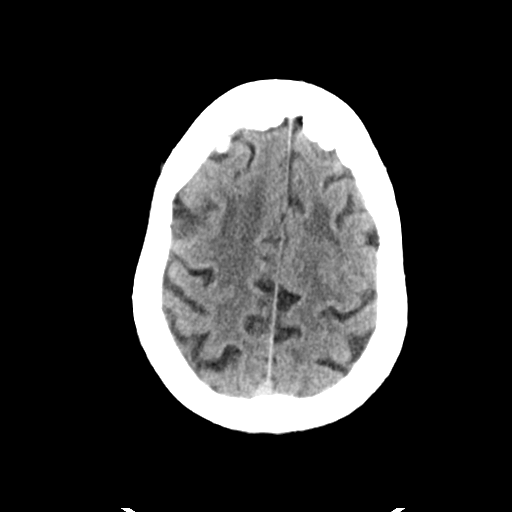
[im 31/34  brain]
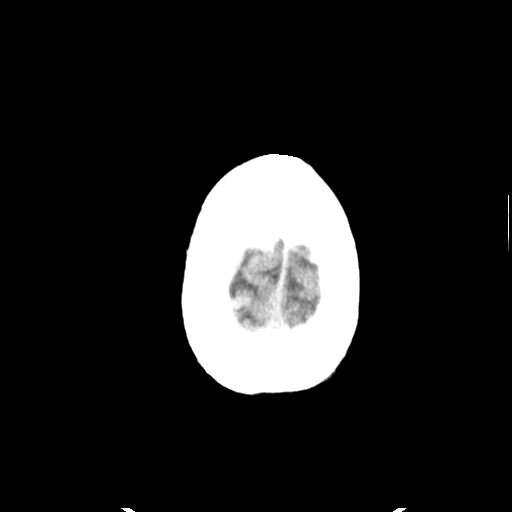
[im 31/34  bone]
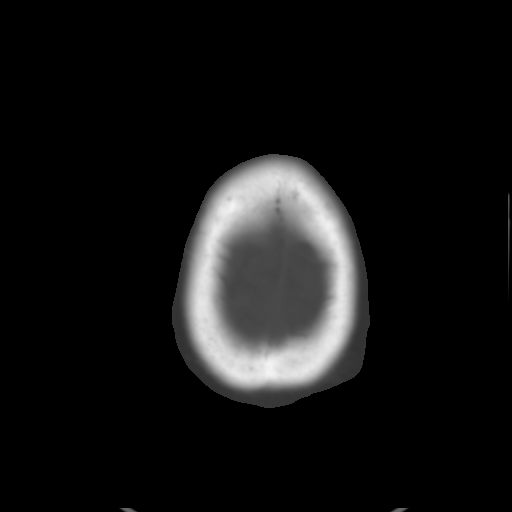

[Series 4: coronal soft tissue · coronal · 0.33mm/px · 3 of 81 slices shown]
[im 27/81  brain]
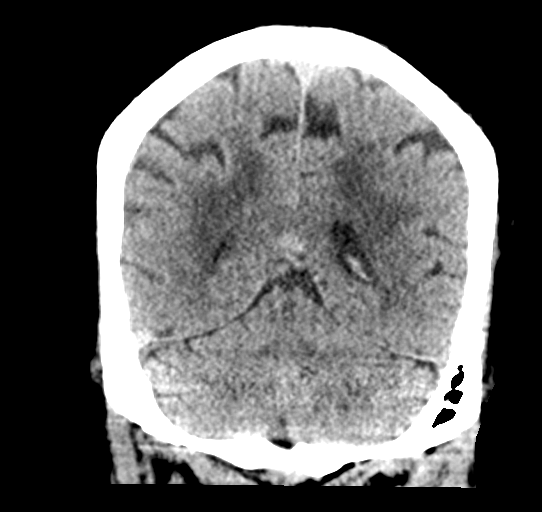
[im 36/81  brain]
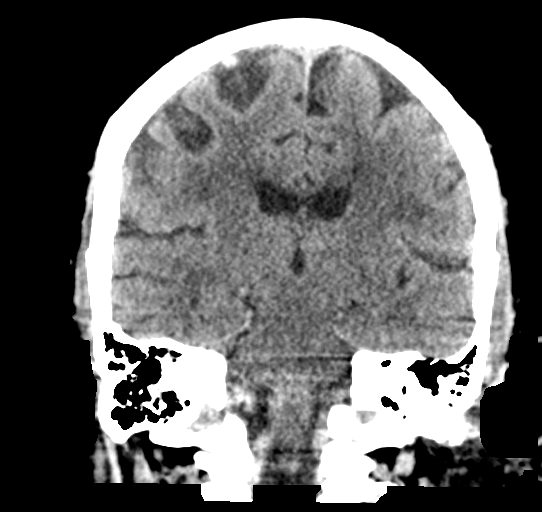
[im 45/81  brain]
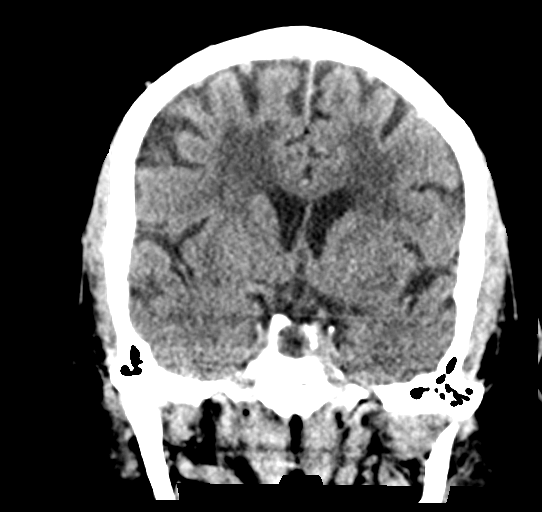

[Series 5: sagittal soft tissue · sagittal · 0.34mm/px · 3 of 57 slices shown]
[im 19/57  brain]
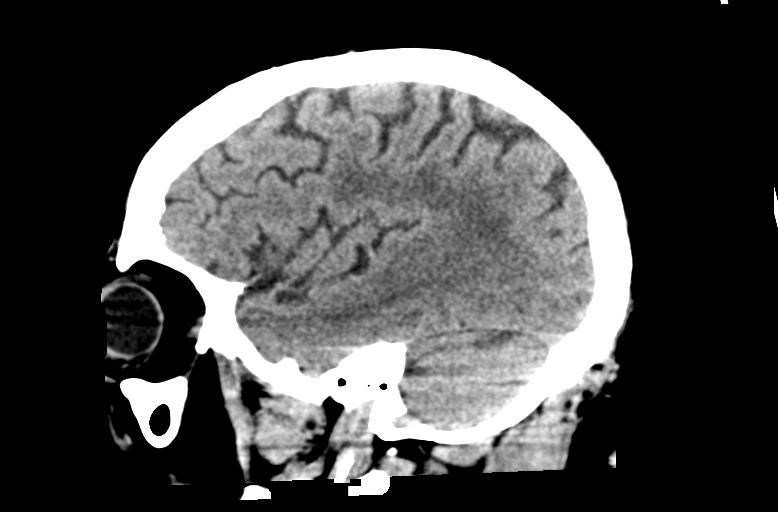
[im 29/57  brain]
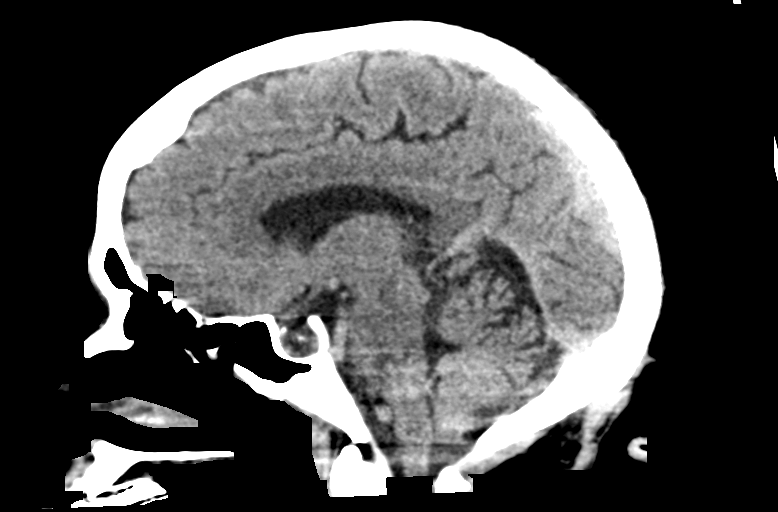
[im 38/57  brain]
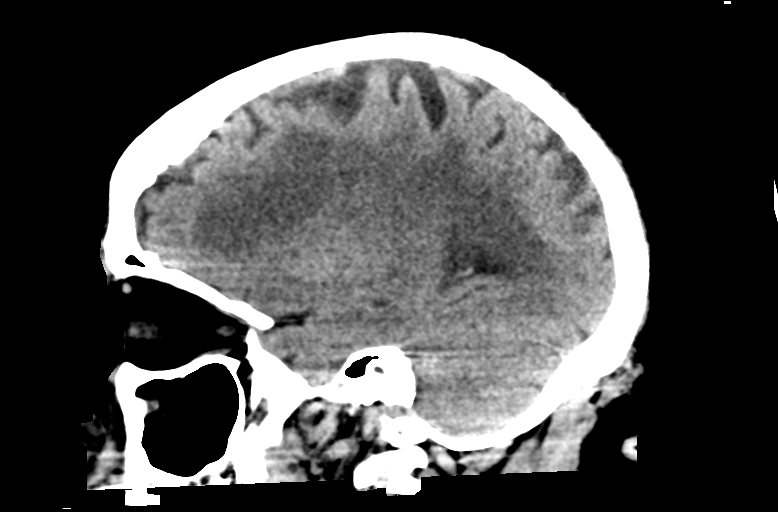

[15 of 47 positions shown; findings below may reference images not displayed]

FINDINGS: Brain: There is no acute intracranial hemorrhage, mass effect, or
edema. Gray-white differentiation is preserved. There is no
extra-axial fluid collection. Prominence of the ventricles and sulci
reflects minor generalized parenchymal volume loss. Confluent areas
of hypoattenuation in the supratentorial white matter are
nonspecific may reflect advanced chronic microvascular ischemic
changes. Small chronic infarcts or prominent perivascular spaces at
the level of the inferior basal ganglia bilaterally.

Vascular: There is atherosclerotic calcification at the skull base.

Skull: Calvarium is unremarkable.

Sinuses/Orbits: No acute finding.

Other: None.
IMPRESSION: No acute intracranial abnormality.

Advanced chronic microvascular ischemic changes.

## 2021-06-16 ENCOUNTER — Other Ambulatory Visit: Payer: Self-pay

## 2021-06-16 ENCOUNTER — Other Ambulatory Visit: Payer: Self-pay | Admitting: Nurse Practitioner

## 2021-06-16 DIAGNOSIS — J449 Chronic obstructive pulmonary disease, unspecified: Secondary | ICD-10-CM

## 2021-06-16 MED ORDER — ALBUTEROL SULFATE HFA 108 (90 BASE) MCG/ACT IN AERS: 2.0000 | INHALATION_SPRAY | Freq: Four times a day (QID) | RESPIRATORY_TRACT | 1 refills | Status: DC | PRN

## 2021-06-23 ENCOUNTER — Ambulatory Visit (HOSPITAL_COMMUNITY)
Admission: RE | Admit: 2021-06-23 | Discharge: 2021-06-23 | Disposition: A | Discharge status: Home / Self Care | Payer: Medicare Other | Source: Ambulatory Visit | Attending: Pulmonary Disease | Admitting: Pulmonary Disease

## 2021-06-23 ENCOUNTER — Other Ambulatory Visit: Payer: Self-pay

## 2021-06-23 DIAGNOSIS — R918 Other nonspecific abnormal finding of lung field: Secondary | ICD-10-CM | POA: Diagnosis present

## 2021-06-24 ENCOUNTER — Emergency Department (HOSPITAL_COMMUNITY)
Admission: EM | Admit: 2021-06-24 | Discharge: 2021-06-24 | Disposition: A | Discharge status: Home / Self Care | Payer: Medicare Other | Attending: Emergency Medicine | Admitting: Emergency Medicine

## 2021-06-24 DIAGNOSIS — Z7951 Long term (current) use of inhaled steroids: Secondary | ICD-10-CM | POA: Insufficient documentation

## 2021-06-24 DIAGNOSIS — J45909 Unspecified asthma, uncomplicated: Secondary | ICD-10-CM | POA: Insufficient documentation

## 2021-06-24 DIAGNOSIS — Z7982 Long term (current) use of aspirin: Secondary | ICD-10-CM | POA: Insufficient documentation

## 2021-06-24 DIAGNOSIS — Z79899 Other long term (current) drug therapy: Secondary | ICD-10-CM | POA: Insufficient documentation

## 2021-06-24 DIAGNOSIS — Z853 Personal history of malignant neoplasm of breast: Secondary | ICD-10-CM | POA: Insufficient documentation

## 2021-06-24 DIAGNOSIS — I509 Heart failure, unspecified: Secondary | ICD-10-CM | POA: Insufficient documentation

## 2021-06-24 DIAGNOSIS — Z87891 Personal history of nicotine dependence: Secondary | ICD-10-CM | POA: Diagnosis not present

## 2021-06-24 DIAGNOSIS — J441 Chronic obstructive pulmonary disease with (acute) exacerbation: Secondary | ICD-10-CM | POA: Diagnosis not present

## 2021-06-24 DIAGNOSIS — R0602 Shortness of breath: Secondary | ICD-10-CM | POA: Diagnosis present

## 2021-06-24 DIAGNOSIS — Z20822 Contact with and (suspected) exposure to covid-19: Secondary | ICD-10-CM | POA: Insufficient documentation

## 2021-06-24 LAB — CBC
HCT: 46.4 % — ABNORMAL HIGH (ref 36.0–46.0)
Hemoglobin: 14.6 g/dL (ref 12.0–15.0)
MCH: 28.5 pg (ref 26.0–34.0)
MCHC: 31.5 g/dL (ref 30.0–36.0)
MCV: 90.6 fL (ref 80.0–100.0)
Platelets: 201 10*3/uL (ref 150–400)
RBC: 5.12 MIL/uL — ABNORMAL HIGH (ref 3.87–5.11)
RDW: 14.6 % (ref 11.5–15.5)
WBC: 9.4 10*3/uL (ref 4.0–10.5)
nRBC: 0 % (ref 0.0–0.2)

## 2021-06-24 LAB — BASIC METABOLIC PANEL
Anion gap: 9 (ref 5–15)
BUN: 23 mg/dL (ref 8–23)
CO2: 31 mmol/L (ref 22–32)
Calcium: 9.4 mg/dL (ref 8.9–10.3)
Chloride: 103 mmol/L (ref 98–111)
Creatinine, Ser: 1.4 mg/dL — ABNORMAL HIGH (ref 0.44–1.00)
GFR, Estimated: 40 mL/min — ABNORMAL LOW (ref >60)
Glucose, Bld: 76 mg/dL (ref 70–99)
Potassium: 4.1 mmol/L (ref 3.5–5.1)
Sodium: 143 mmol/L (ref 135–145)

## 2021-06-24 LAB — TROPONIN I (HIGH SENSITIVITY)
Troponin I (High Sensitivity): 4 ng/L (ref <18)
Troponin I (High Sensitivity): 5 ng/L (ref <18)

## 2021-06-24 LAB — BRAIN NATRIURETIC PEPTIDE: B Natriuretic Peptide: 67.3 pg/mL (ref 0.0–100.0)

## 2021-06-24 MED ORDER — METHYLPREDNISOLONE SODIUM SUCC 125 MG IJ SOLR
125.0000 mg | Freq: Once | INTRAMUSCULAR | Status: AC
  Administered 2021-06-24: 125 mg via INTRAVENOUS
  Filled 2021-06-24: qty 2

## 2021-06-24 MED ORDER — PREDNISONE 20 MG PO TABS: ORAL_TABLET | ORAL | 0 refills | Status: DC

## 2021-06-24 MED ORDER — IPRATROPIUM-ALBUTEROL 0.5-2.5 (3) MG/3ML IN SOLN
3.0000 mL | Freq: Once | RESPIRATORY_TRACT | Status: AC
  Administered 2021-06-24: 3 mL via RESPIRATORY_TRACT
  Filled 2021-06-24: qty 3

## 2021-06-24 NOTE — ED Provider Notes (Signed)
[facility] DEPT Provider Note   CSN: [id number] Arrival date & time: 06/24/21  1438     History Chief Complaint  Patient presents with   Shortness of Breath   Chest Pain    Jessica [name] is a 75 y.o. female.  HPI Patient reports has had shortness of breath for about the past week.  She has history of COPD and this feels like a COPD symptom.  She reports she is fine if she is at rest but when she gets up and walks she gets more short of breath.  She reports over the past day now she is also noticed that she is wheezing.  She has been using her inhalers but symptoms have progressed.  No associated chest pain, no fever, no productive cough.  No body aches or malaise.  Patient quit smoking about 2 years ago.  She reports she chronically wears nighttime oxygen.  She reports the last time she was on prednisone was was a couple of years ago.  No lower extremity swelling or calf pain.    Past Medical History:  Diagnosis Date   Asthma    Cancer ([place])    CHF (congestive heart failure) ([place])    Chronic kidney disease    COPD (chronic obstructive pulmonary disease) ([place])    Goals of care, counseling/discussion 08/25/2020   Hyperlipidemia    Hypertension    Pre-diabetes    Sleep apnea    Stage I breast cancer, left ([place]) 08/25/2020   Stroke [facility])     Patient Active Problem List   Diagnosis Date Noted   Esophageal dysphagia    Benign esophageal stricture    Hiatal hernia    Gastritis and gastroduodenitis    Screening for colorectal cancer    Grade III hemorrhoids    Adenomatous polyp of ascending colon    Polyp of descending colon    Polyp of sigmoid colon    Rectal polyp    PVC's (premature ventricular contractions) 10/19/2020   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   Healthcare maintenance 10/12/2020   Former smoker 10/12/2020   Essential hypertension 09/03/2020   Stage I breast cancer, left ([place]) 08/25/2020   Goals of care,  counseling/discussion 08/25/2020   Hypertensive crisis 08/11/2020   Poor sleep 08/06/2020   Anxiety 08/06/2020   Vitamin D deficiency 07/30/2020    Past Surgical History:  Procedure Laterality Date   BALLOON DILATION N/A 03/28/2021   Procedure: BALLOON DILATION;  Surgeon: Lavena Bullion, DO;  Location: [facility];  Service: Gastroenterology;  Laterality: N/A;   BIOPSY  03/28/2021   Procedure: BIOPSY;  Surgeon: Lavena Bullion, DO;  Location: [facility];  Service: Gastroenterology;;   COLONOSCOPY WITH PROPOFOL N/A 03/28/2021   Procedure: COLONOSCOPY WITH PROPOFOL;  Surgeon: Lavena Bullion, DO;  Location: [facility];  Service: Gastroenterology;  Laterality: N/A;   ESOPHAGOGASTRODUODENOSCOPY (EGD) WITH PROPOFOL N/A 03/28/2021   Procedure: ESOPHAGOGASTRODUODENOSCOPY (EGD) WITH PROPOFOL;  Surgeon: Lavena Bullion, DO;  Location: [facility];  Service: Gastroenterology;  Laterality: N/A;   POLYPECTOMY  03/28/2021   Procedure: POLYPECTOMY;  Surgeon: Lavena Bullion, DO;  Location: [facility];  Service: Gastroenterology;;   stomach surgery  1990     OB History   No obstetric history on file.     Family History  Problem Relation Age of Onset   Stroke Mother    Heart disease Mother    Hyperlipidemia Mother    Heart disease Maternal Aunt    Hyperlipidemia  Maternal Aunt    Stroke Maternal Aunt     Social History   Tobacco Use   Smoking status: Former    Packs/day: 0.50    Years: 55.00    Pack years: 27.50    Types: Cigarettes    Start date: 10/17/1963    Quit date: 01/15/2019    Years since quitting: 2.4   Smokeless tobacco: Never  Vaping Use   Vaping Use: Never used  Substance Use Topics   Alcohol use: Never   Drug use: Never    Home Medications Prior to Admission medications   Medication Sig Start Date End Date Taking? Authorizing Provider  predniSONE (DELTASONE) 20 MG tablet 2 tabs po daily x 4 days 06/24/21  Yes Kandace Elrod, Jeannie Done, MD  albuterol  (ACCUNEB) 1.25 MG/3ML nebulizer solution Take 3 mLs (1.25 mg total) by nebulization every 6 (six) hours as needed for wheezing. 06/28/20   Mannam, Hart Robinsons, MD  albuterol (VENTOLIN HFA) 108 (90 Base) MCG/ACT inhaler Inhale 2 puffs into the lungs every 6 (six) hours as needed for wheezing. 06/16/21   Nche, Charlene Brooke, NP  amLODipine (NORVASC) 10 MG tablet Take 1 tablet by mouth daily for blood pressure Patient taking differently: Take 10 mg by mouth in the morning. 11/09/20   Donato Heinz, MD  anastrozole (ARIMIDEX) 1 MG tablet Take 1 tablet (1 mg total) by mouth daily. Patient taking differently: Take 1 mg by mouth in the morning. 01/25/21   Volanda Napoleon, MD  aspirin EC 81 MG tablet Take 81 mg by mouth in the morning. Swallow whole.    [provider]  atorvastatin (LIPITOR) 80 MG tablet TAKE 1 TABLET BY MOUTH DAILY FOR CHOLESTEROL 05/26/21   Donato Heinz, MD  Budeson-Glycopyrrol-Formoterol (BREZTRI AEROSPHERE) 160-9-4.8 MCG/ACT AERO Inhale 1 puff into the lungs daily as needed (respiratory issues).    [provider]  carvedilol (COREG) 25 MG tablet Take 1 tablet by mouth twice daily for blood pressure Patient taking differently: Take 25 mg by mouth in the morning and at bedtime. 11/09/20   Donato Heinz, MD  CLENPIQ 10-3.5-12 MG-GM -GM/160ML SOLN Take 320 mLs by mouth as directed. 01/27/21   [provider]  cloNIDine (CATAPRES) 0.2 MG tablet Take 1 tablet by mouth twice daily for blood pressure Patient taking differently: Take 0.2 mg by mouth 2 (two) times daily. 11/09/20   Donato Heinz, MD  doxazosin (CARDURA) 1 MG tablet Take 1 tablet by mouth twice daily for blood pressure Patient taking differently: Take 1 mg by mouth 2 (two) times daily. 11/09/20   Donato Heinz, MD  ipratropium-albuterol (DUONEB) 0.5-2.5 (3) MG/3ML SOLN Use 3-4 times per day as needed for shortness of breath 12/30/20   Mannam, Hart Robinsons, MD  sertraline  (ZOLOFT) 50 MG tablet Take 1 tablet (50 mg total) by mouth daily. Patient taking differently: Take 50 mg by mouth in the morning. 03/07/21   Cirigliano, Garvin Fila, DO  traZODone (DESYREL) 50 MG tablet Take 50 mg by mouth at bedtime as needed for sleep. 03/16/21   [provider]  valsartan (DIOVAN) 160 MG tablet TAKE 1 TABLET BY MOUTH DAILY FOR BLOOD PRESSURE 05/26/21   Donato Heinz, MD  zolpidem (AMBIEN) 5 MG tablet Take 1 tablet (5 mg total) by mouth at bedtime as needed for sleep. 01/20/21   Ronnald Nian, DO    Allergies    Patient has no known allergies.  Review of Systems   Review of Systems  10 systems reviewed and negative except as per HPI Physical Exam Updated Vital Signs BP (!) 177/96   Pulse 84   Temp 98.5 F (36.9 C) (Oral)   Resp 18   SpO2 96%   Physical Exam Constitutional:      Comments: Alert nontoxic no respiratory distress at rest.  HENT:     Mouth/Throat:     Pharynx: Oropharynx is clear.  Eyes:     Extraocular Movements: Extraocular movements intact.  Cardiovascular:     Rate and Rhythm: Normal rate and regular rhythm.  Pulmonary:     Comments: No respiratory distress at rest.  Diffuse fine expiratory wheeze.  Diminished breath sounds in the bases. Abdominal:     General: There is no distension.     Palpations: Abdomen is soft.     Tenderness: There is no abdominal tenderness. There is no guarding.  Musculoskeletal:        General: No swelling or tenderness. Normal range of motion.     Right lower leg: No edema.     Left lower leg: No edema.    ED Results / Procedures / Treatments   Labs (all labs ordered are listed, but only abnormal results are displayed) Labs Reviewed  BASIC METABOLIC PANEL - Abnormal; Notable for the following components:      Result Value   Creatinine, Ser 1.40 (*)    GFR, Estimated 40 (*)    All other components within normal limits  CBC - Abnormal; Notable for the following components:   RBC 5.12 (*)     HCT 46.4 (*)    All other components within normal limits  BRAIN NATRIURETIC PEPTIDE  TROPONIN I (HIGH SENSITIVITY)  TROPONIN I (HIGH SENSITIVITY)    EKG EKG Interpretation  Date/Time:  Friday June 24 2021 14:57:08 EDT Ventricular Rate:  81 PR Interval:  233 QRS Duration: 111 QT Interval:  377 QTC Calculation: 438 R Axis:   88 Text Interpretation: Sinus rhythm Multiform ventricular premature complexes Prolonged PR interval Right atrial enlargement Borderline right axis deviation many PVC o/w no sig chnag from previous Confirmed by Charlesetta Shanks [phone]) on 06/24/2021 3:18:05 PM  Radiology CT Chest Wo Contrast  Result Date: 06/24/2021 CLINICAL DATA:  Follow-up pulmonary nodules, smoking history EXAM: CT CHEST WITHOUT CONTRAST TECHNIQUE: Multidetector CT imaging of the chest was performed following the standard protocol without IV contrast. COMPARISON:  07/14/2020 FINDINGS: Cardiovascular: Aortic atherosclerosis. Normal heart size. Three-vessel coronary artery calcifications and stents. Small pericardial effusion. Mediastinum/Nodes: No enlarged mediastinal, hilar, or axillary lymph nodes. Thyroid gland, trachea, and esophagus demonstrate no significant findings. Lungs/Pleura: Mild centrilobular and paraseptal emphysema. Diffuse bilateral bronchial wall thickening. Occasional stable, small bilateral pulmonary nodules, largest subpleural nodule of the right middle lobe measuring 5 mm (series 5, image 103). Bandlike scarring of the lingula and lower lobe (series 5, image 24). No pleural effusion or pneumothorax. Upper Abdomen: No acute abnormality. Musculoskeletal: No chest wall mass or suspicious bone lesions identified. Postoperative findings of left lumpectomy. IMPRESSION: 1. Occasional stable, small bilateral pulmonary nodules, measuring 5 mm and smaller, benign sequelae of infection or inflammation. No specific further follow-up is indicated for these nodules. Consider ongoing annual  low-dose CT lung cancer screening if indicated by patient age, smoking history, and/or other risk factors for lung cancer. 2. Emphysema and diffuse bilateral bronchial wall thickening. 3. Coronary artery disease. 4. Unchanged small pericardial effusion. Aortic Atherosclerosis (ICD10-I70.0) and Emphysema (ICD10-J43.9). Electronically Signed   By: Eddie Candle M.D.   On:  06/24/2021 10:05    Procedures Procedures   Medications Ordered in ED Medications  ipratropium-albuterol (DUONEB) 0.5-2.5 (3) MG/3ML nebulizer solution 3 mL (3 mLs Nebulization Given 06/24/21 1717)  methylPREDNISolone sodium succinate (SOLU-MEDROL) 125 mg/2 mL injection 125 mg (125 mg Intravenous Given 06/24/21 1715)    ED Course  I have reviewed the triage vital signs and the nursing notes.  Pertinent labs & imaging results that were available during my care of the patient were reviewed by me and considered in my medical decision making (see chart for details).    MDM Rules/Calculators/A&P                           Patient is an incrementally worsening shortness of breath.  She did have a outpatient scheduled CT scan today.  Scan does not show any significant acute findings.  No signs of pneumonia, CHF which or other structural anomalies.  Patient does have wheezing on exam with history of COPD.  At this time findings are consistent with COPD exacerbation we will proceed with DuoNeb and Solu-Medrol.  Will get basic labs for cardiac enzymes and renal function.  Diagnostic work-up within normal limits.  At this time clinical exam and history most recent with COPD exacerbation.  Will provide prednisone burst and recommend continued use of DuoNebs.  Return precautions reviewed. Final Clinical Impression(s) / ED Diagnoses Final diagnoses:  COPD exacerbation ([place])    Rx / DC Orders ED Discharge Orders          Ordered    predniSONE (DELTASONE) 20 MG tablet        06/24/21 1936             Charlesetta Shanks, MD 06/24/21  1939

## 2021-06-24 NOTE — ED Triage Notes (Signed)
Pt arrived via POV, c/o CP, SOB. Speaking in partial sentences.

## 2021-06-24 NOTE — Discharge Instructions (Addendum)
Your symptoms appear to be due to COPD.  Start prednisone tomorrow morning as prescribed.  You were given a dose of steroids in the emergency department this evening.  Use your albuterol/Atrovent (DuoNeb) nebulizer solution every 4-6 hours for the next 24 to 48 hours.  Then as needed.  Return to the emergency department if you have any new worsening or concerning symptoms.  Try to schedule a follow-up appointment with your doctor within the next 3 to 5 days.

## 2021-07-03 NOTE — Progress Notes (Signed)
$'@Patient'H$  ID: Jessica [name], female    DOB: 07-08-1946, 75 y.o.   MRN: [id number]  Chief Complaint  Patient presents with   Follow-up    Patient reports she is having increased short of breath with exertion.     Referring provider: Flossie Buffy, NP  HPI: 75 year old female, former smoker quit 2019 (26.5 pack year hx). PMH significant for asthma-COPD overlap, HTN, esophageal dysphagia, breast cancer. Patient of Dr. Vaughan Browner, last seen on 12/30/20.  Previous [facility] pulmonary encounter: 12/30/20 75 year old with severe COPD, OSA She moved to [facility] from [place] in July 2021 and is here to establish care Previously followed at [facility] in [place]   Has history of severe COPD with multiple exacerbations in the past Question of noncompliance with medication per office notes She was on Was on Anoro on 2018.  Inhaler changed to Bevespi, Trelegy in 2019.  Due to coverage with insurance she has changed to Advair. Sleep study in 2012 diagnosed with OSA but never started on CPAP.  She was referred for repeat sleep study but was not interested in getting this done Diagnosed with breast cancer in February 2019 with lumpectomy and radiation.  She is currently on hormonal therapy.   Chief complaint is dyspnea on exertion which is gradually getting worse.  Denies any cough, sputum proximal, fevers, chills   Pets: No pets Occupation: Retired Psychologist, counselling Exposures: No known exposures.  No mold, hot tub, Jacuzzi Smoking history: 50-pack-year smoker.  Quit in 2020 Travel history: Previously lived in [place], [place] Relevant family history: No significant family history of lung disease   Interim history: Change to Trelegy at last visit.  States that her breathing is still not improved Has chronic dyspnea on exertion, daily cough with mucus production   Sleep study in late 2021 did not show sleep apnea.  She did however have nocturnal desats and started on  supplemental oxygen   07/04/2021- interim hx  Patient presents today for 6 month follow-up sever COPD/nocturnal hypoxia.  She is not current using any maintenance. She received samples of Breztri in March and ran out 1 week ago. From the sounds of it she has only been using albuterol inhaler and nebulizer the last 6 months. She feels Trelegy worked better, states "aint nothing like the old medicine". Sleep study in late 2021 showed no evidence of sleep apnea but did show nocurtnal oxygen desaturations.  She is asking about portable options for oxygen to use at night. She goes away for 5-10 days a month and does not bring or use her oxygen at night.   Smoking history: 55 years (age [age]) x 1/2 pack daily =  26.5 pack year hx    No Known Allergies  Immunization History  Administered Date(s) Administered   Fluad Quad(high Dose 65+) 07/21/2020   PFIZER(Purple Top)SARS-COV-2 Vaccination 12/12/2019, 12/23/2019, 08/23/2020   Pneumococcal Polysaccharide-23 10/12/2020    Past Medical History:  Diagnosis Date   Asthma    Cancer ([place])    CHF (congestive heart failure) ([place])    Chronic kidney disease    COPD (chronic obstructive pulmonary disease) ([place])    Goals of care, counseling/discussion 08/25/2020   Hyperlipidemia    Hypertension    Pre-diabetes    Sleep apnea    Stage I breast cancer, left ([place]) 08/25/2020   Stroke ([place])     Tobacco History: Social History   Tobacco Use  Smoking Status Former   Packs/day: 0.50   Years: 55.00  Pack years: 27.50   Types: Cigarettes   Start date: 10/17/1963   Quit date: 01/15/2019   Years since quitting: 2.4  Smokeless Tobacco Never   Counseling given: Not Answered   Outpatient Medications Prior to Visit  Medication Sig Dispense Refill   albuterol (ACCUNEB) 1.25 MG/3ML nebulizer solution Take 3 mLs (1.25 mg total) by nebulization every 6 (six) hours as needed for wheezing. 75 mL 3   albuterol (VENTOLIN HFA) 108 (90 Base) MCG/ACT inhaler  Inhale 2 puffs into the lungs every 6 (six) hours as needed for wheezing. 1 each 1   amLODipine (NORVASC) 10 MG tablet Take 1 tablet by mouth daily for blood pressure (Patient taking differently: Take 10 mg by mouth in the morning.) 90 tablet 3   anastrozole (ARIMIDEX) 1 MG tablet Take 1 tablet (1 mg total) by mouth daily. (Patient taking differently: Take 1 mg by mouth in the morning.) 90 tablet 6   aspirin EC 81 MG tablet Take 81 mg by mouth in the morning. Swallow whole.     atorvastatin (LIPITOR) 80 MG tablet TAKE 1 TABLET BY MOUTH DAILY FOR CHOLESTEROL 90 tablet 3   Budeson-Glycopyrrol-Formoterol (BREZTRI AEROSPHERE) 160-9-4.8 MCG/ACT AERO Inhale 1 puff into the lungs daily as needed (respiratory issues).     carvedilol (COREG) 25 MG tablet Take 1 tablet by mouth twice daily for blood pressure (Patient taking differently: Take 25 mg by mouth in the morning and at bedtime.) 180 tablet 3   CLENPIQ 10-3.5-12 MG-GM -GM/160ML SOLN Take 320 mLs by mouth as directed.     cloNIDine (CATAPRES) 0.2 MG tablet Take 1 tablet by mouth twice daily for blood pressure (Patient taking differently: Take 0.2 mg by mouth 2 (two) times daily.) 180 tablet 3   doxazosin (CARDURA) 1 MG tablet Take 1 tablet by mouth twice daily for blood pressure (Patient taking differently: Take 1 mg by mouth 2 (two) times daily.) 180 tablet 1   ipratropium-albuterol (DUONEB) 0.5-2.5 (3) MG/3ML SOLN Use 3-4 times per day as needed for shortness of breath 360 mL 5   sertraline (ZOLOFT) 50 MG tablet Take 1 tablet (50 mg total) by mouth daily. (Patient taking differently: Take 50 mg by mouth in the morning.) 90 tablet 1   traZODone (DESYREL) 50 MG tablet Take 50 mg by mouth at bedtime as needed for sleep.     valsartan (DIOVAN) 160 MG tablet TAKE 1 TABLET BY MOUTH DAILY FOR BLOOD PRESSURE 90 tablet 3   zolpidem (AMBIEN) 5 MG tablet Take 1 tablet (5 mg total) by mouth at bedtime as needed for sleep. 30 tablet 1   predniSONE (DELTASONE) 20 MG  tablet 2 tabs po daily x 4 days (Patient not taking: Reported on 07/04/2021) 8 tablet 0   Facility-Administered Medications Prior to Visit  Medication Dose Route Frequency Provider Last Rate Last Admin   nitroGLYCERIN (NITROSTAT) SL tablet 0.4 mg  0.4 mg Sublingual Q5 min PRN Donato Heinz, MD   0.4 mg at 08/11/20 1610    Review of Systems  Review of Systems  Constitutional: Negative.   HENT: Negative.    Respiratory:  Positive for chest tightness and shortness of breath. Negative for cough and wheezing.   Cardiovascular: Negative.     Physical Exam  BP 140/80 (BP Location: Left Arm, Patient Position: Sitting, Cuff Size: Large)   Pulse 77   Temp (!) 97.5 F (36.4 C) (Oral)   Ht '5\' 9"'$  (1.753 m)   Wt 237 lb 6.4 oz (107.7  kg)   SpO2 95%   BMI 35.06 kg/m  Physical Exam Constitutional:      Appearance: Normal appearance.  HENT:     Mouth/Throat:     Mouth: Mucous membranes are moist.     Pharynx: Oropharynx is clear.  Cardiovascular:     Rate and Rhythm: Normal rate and regular rhythm.     Comments: No edema Pulmonary:     Effort: Pulmonary effort is normal.     Breath sounds: Normal breath sounds. No wheezing, rhonchi or rales.  Skin:    General: Skin is warm and dry.  Neurological:     General: No focal deficit present.     Mental Status: She is alert and oriented to person, place, and time. Mental status is at baseline.  Psychiatric:        Mood and Affect: Mood normal.        Behavior: Behavior normal.        Thought Content: Thought content normal.        Judgment: Judgment normal.     Lab Results:  CBC    Component Value Date/Time   WBC 9.4 06/24/2021 1651   RBC 5.12 (H) 06/24/2021 1651   HGB 14.6 06/24/2021 1651   HGB 14.7 01/25/2021 0910   HCT 46.4 (H) 06/24/2021 1651   PLT 201 06/24/2021 1651   PLT 215 01/25/2021 0910   MCV 90.6 06/24/2021 1651   MCH 28.5 06/24/2021 1651   MCHC 31.5 06/24/2021 1651   RDW 14.6 06/24/2021 1651   LYMPHSABS  1.7 01/25/2021 0910   MONOABS 0.9 01/25/2021 0910   EOSABS 0.2 01/25/2021 0910   BASOSABS 0.0 01/25/2021 0910    BMET    Component Value Date/Time   NA 143 06/24/2021 1651   NA 146 (H) 03/29/2021 1430   K 4.1 06/24/2021 1651   CL 103 06/24/2021 1651   CO2 31 06/24/2021 1651   GLUCOSE 76 06/24/2021 1651   BUN 23 06/24/2021 1651   BUN 14 03/29/2021 1430   CREATININE 1.40 (H) 06/24/2021 1651   CREATININE 1.20 (H) 01/25/2021 0910   CALCIUM 9.4 06/24/2021 1651   GFRNONAA 40 (L) 06/24/2021 1651   GFRNONAA 48 (L) 01/25/2021 0910   GFRAA 46 (L) 10/18/2020 1106    BNP    Component Value Date/Time   BNP 67.3 06/24/2021 1652    ProBNP No results found for: PROBNP  Imaging: CT Chest Wo Contrast  Result Date: 06/24/2021 CLINICAL DATA:  Follow-up pulmonary nodules, smoking history EXAM: CT CHEST WITHOUT CONTRAST TECHNIQUE: Multidetector CT imaging of the chest was performed following the standard protocol without IV contrast. COMPARISON:  07/14/2020 FINDINGS: Cardiovascular: Aortic atherosclerosis. Normal heart size. Three-vessel coronary artery calcifications and stents. Small pericardial effusion. Mediastinum/Nodes: No enlarged mediastinal, hilar, or axillary lymph nodes. Thyroid gland, trachea, and esophagus demonstrate no significant findings. Lungs/Pleura: Mild centrilobular and paraseptal emphysema. Diffuse bilateral bronchial wall thickening. Occasional stable, small bilateral pulmonary nodules, largest subpleural nodule of the right middle lobe measuring 5 mm (series 5, image 103). Bandlike scarring of the lingula and lower lobe (series 5, image 24). No pleural effusion or pneumothorax. Upper Abdomen: No acute abnormality. Musculoskeletal: No chest wall mass or suspicious bone lesions identified. Postoperative findings of left lumpectomy. IMPRESSION: 1. Occasional stable, small bilateral pulmonary nodules, measuring 5 mm and smaller, benign sequelae of infection or inflammation. No  specific further follow-up is indicated for these nodules. Consider ongoing annual low-dose CT lung cancer screening if indicated by patient age, smoking  history, and/or other risk factors for lung cancer. 2. Emphysema and diffuse bilateral bronchial wall thickening. 3. Coronary artery disease. 4. Unchanged small pericardial effusion. Aortic Atherosclerosis (ICD10-I70.0) and Emphysema (ICD10-J43.9). Electronically Signed   By: Eddie Candle M.D.   On: 06/24/2021 10:05     Assessment & Plan:   Asthma-COPD overlap syndrome ([place]) - She has moderate-severe dyspnea symptoms with exertion with associated chest tightness. She does not have acute symptoms in office today. She is not consistently using maintenance inhaler. Prefers Trelegy versus Librarian, academic. Plan resume Trelegy 234mg daily, we did not have any samples in office today so I gave her 1 week supple of Breztri. Consider biologic therapy in the future as she likely has asthma with mild elevated peripheral eos and elevated IgE. FU in 3 months with Dr. MVaughan Browner   Nocturnal hypoxia - Sleep study in late 2021 showed no evidence of sleep apnea but did show nocurtnal oxygen desaturations. Continue 2L oxygen at bedtime. She did not qualify for daytime oxygen today.    EMartyn Ehrich NP 07/04/2021

## 2021-07-04 ENCOUNTER — Encounter: Payer: Self-pay | Admitting: Primary Care

## 2021-07-04 ENCOUNTER — Other Ambulatory Visit: Payer: Self-pay

## 2021-07-04 ENCOUNTER — Ambulatory Visit: Payer: Medicare Other | Admitting: Primary Care

## 2021-07-04 VITALS — BP 140/80 | HR 77 | Temp 97.5°F | Ht 69.0 in | Wt 237.4 lb

## 2021-07-04 DIAGNOSIS — Z87891 Personal history of nicotine dependence: Secondary | ICD-10-CM

## 2021-07-04 DIAGNOSIS — G4734 Idiopathic sleep related nonobstructive alveolar hypoventilation: Secondary | ICD-10-CM | POA: Diagnosis not present

## 2021-07-04 DIAGNOSIS — J449 Chronic obstructive pulmonary disease, unspecified: Secondary | ICD-10-CM | POA: Diagnosis not present

## 2021-07-04 MED ORDER — TRELEGY ELLIPTA 200-62.5-25 MCG/INH IN AEPB: 1.0000 | INHALATION_SPRAY | Freq: Every day | RESPIRATORY_TRACT | 11 refills | Status: AC

## 2021-07-04 MED ORDER — BREZTRI AEROSPHERE 160-9-4.8 MCG/ACT IN AERO: 2.0000 | INHALATION_SPRAY | Freq: Two times a day (BID) | RESPIRATORY_TRACT | 0 refills | Status: DC

## 2021-07-04 NOTE — Assessment & Plan Note (Signed)
-   Sleep study in late 2021 showed no evidence of sleep apnea but did show nocurtnal oxygen desaturations. Continue 2L oxygen at bedtime. She did not qualify for daytime oxygen today.

## 2021-07-04 NOTE — Patient Instructions (Addendum)
Recommendations: - Use Breztri 2 puffs morning and evening until you pick up prescription for Trelegy  - Resume Trelegy 200 one puff EVERY DAY in the morning (this is your maintenance inhaler) - Use albuterol rescue inhaler or nebulizer every 6 hours AS NEEDED ONLY for breakthrough shortness of breath  - Continue to wear 2L oxygen at bedtime  - Call [employer] durable medical equipment store and ask about portable oxygen options (you can look into a company online called [employer] that can provide you with portable concentrator but cost can be expensive)  Orders: Ambulatory walk on room air today (no oxygen during daytime needed)  Referral: Lung cancer screening program (ordered)  Follow-up: 3 months with Dr. Vaughan Browner or sooner if needed

## 2021-07-04 NOTE — Assessment & Plan Note (Addendum)
-   She has moderate-severe dyspnea symptoms with exertion with associated chest tightness. She does not have acute symptoms in office today. She is not consistently using maintenance inhaler. Prefers Trelegy versus Librarian, academic. Plan resume Trelegy 272mcg daily, we did not have any samples in office today so I gave her 1 week supple of Breztri. Consider biologic therapy in the future as she likely has asthma with mild elevated peripheral eos and elevated IgE. FU in 3 months with Dr. Vaughan Browner.

## 2021-07-11 ENCOUNTER — Encounter: Payer: Self-pay | Admitting: *Deleted

## 2021-07-11 DIAGNOSIS — Z87891 Personal history of nicotine dependence: Secondary | ICD-10-CM

## 2021-07-15 ENCOUNTER — Telehealth: Payer: Self-pay | Admitting: Pulmonary Disease

## 2021-07-15 NOTE — Telephone Encounter (Signed)
This will need to be address by Dr. Vaughan Browner as I have not see her

## 2021-07-15 NOTE — Telephone Encounter (Signed)
   Call made to patient, confirmed DOB. Made aware of PM results/recommendations. Voiced understanding.   While on phone patient states why am I still SOB when doing things if I don't need oxygen. I made her aware you can be SOB and not need oxygen. Unfortunately this was not something she understood  and still had several questions as to why she did not need it during the day. I reminded her she was evaluated at last office visit for oxygen and at that time she did not need oxygen. She continued to repeat this does not make sense.   EW do you have any other recommendations for patient? If not I can just make another office visit for her? Thanks :)

## 2021-07-15 NOTE — Telephone Encounter (Signed)
Will forward this message to PM to see if he has any further recs for this pt.  Please advise. PM   thanks

## 2021-07-18 NOTE — Telephone Encounter (Signed)
Please make an office visit at next available to discuss.  Though CT does not show worrisome findings she does have severe COPD which could be causing dyspnea.

## 2021-07-19 NOTE — Telephone Encounter (Signed)
I have called and spoke with pt and she is aware of appt with PM on 10/31 at 1030.

## 2021-07-27 ENCOUNTER — Inpatient Hospital Stay: Payer: Medicare Other | Admitting: Hematology & Oncology

## 2021-07-27 ENCOUNTER — Inpatient Hospital Stay: Payer: Medicare Other | Attending: Hematology & Oncology

## 2021-07-28 ENCOUNTER — Encounter: Payer: Medicare Other | Admitting: Nurse Practitioner

## 2021-08-01 ENCOUNTER — Ambulatory Visit: Payer: Medicare Other | Admitting: Cardiology

## 2021-08-11 NOTE — Progress Notes (Deleted)
Cardiology Office Note:    Date:  08/11/2021   ID:  Jessica [name], DOB 1946/06/24, MRN [id number]  PCP:  Flossie Buffy, NP  Cardiologist:  Donato Heinz, MD  Electrophysiologist:  None   Referring MD: Ronnald Nian, DO   No chief complaint on file.   History of Present Illness:    Jessica [name] is a 75 y.o. female with a hx of CVA, COPD, hypertension, hyperlipidemia, CKD, breast cancer, CAD status post PCI who presents for follow-up.  She was initially seen on 08/11/2020.  She moved to [place] from [place] in July 2021.  Reports a history of CAD status post MI in stent x2.  Reports first MI was in 2010, had stent at [facility].  Second stent was DES to LAD in 2017.  At initial clinic visit on 08/11/2020.  She reported having chest pain and BP was markedly elevated.  It was recommended that she go to the ED and she was admitted to [facility] from 10/27 through 08/17/2020.  Troponins were negative.  She initially required nicardipine drip for hypertension.  Work-up of secondary hypertension included renal artery duplex showing bilateral renal artery stenosis 1 to 59%, normal TSH, elevated aldosterone/renin ratio (43), elevated metanephrines.  Referred to endocrinology for further evaluation.  Echocardiogram on 08/13/2020 showed normal biventricular function, no significant valvular disease.  Zio patch x14 days on 10/04/2020 showed 2 episodes of complete heart block, longest lasting 15 seconds, both occurring at night, frequent PVCs (11.5% of beats), one 4 beat episode of NSVT.  BIs on 01/26/2021 showed right 0.87, left 0.82.  Lower extremity duplex showed 30 to 49% stenosis in the right SFA, total occlusion and distal peroneal artery and left greater than 50% stenosis in distal CFA, 30 to 49% stenosis in SFA.  Since last clinic visit,  she reports he has been having some elevated BP at home, up to 150s over 90s.  She denies any chest pain does report some dyspnea.  Denies  any lightheadedness, syncope, or palpitations.  Does report lower extremity edema.  She does report she has been having some pain in her upper right thigh but denies any leg pain with walking.  Wt Readings from Last 3 Encounters:  07/04/21 237 lb 6.4 oz (107.7 kg)  03/29/21 233 lb 3.2 oz (105.8 kg)  03/24/21 230 lb (104.3 kg)    Past Medical History:  Diagnosis Date   Asthma    Cancer ([place])    CHF (congestive heart failure) ([facility])    Chronic kidney disease    COPD (chronic obstructive pulmonary disease) ([place])    Goals of care, counseling/discussion 08/25/2020   Hyperlipidemia    Hypertension    Pre-diabetes    Sleep apnea    Stage I breast cancer, left ([place]) 08/25/2020   Stroke [facility])     Past Surgical History:  Procedure Laterality Date   BALLOON DILATION N/A 03/28/2021   Procedure: BALLOON DILATION;  Surgeon: Lavena Bullion, DO;  Location: [facility];  Service: Gastroenterology;  Laterality: N/A;   BIOPSY  03/28/2021   Procedure: BIOPSY;  Surgeon: Lavena Bullion, DO;  Location: [facility];  Service: Gastroenterology;;   COLONOSCOPY WITH PROPOFOL N/A 03/28/2021   Procedure: COLONOSCOPY WITH PROPOFOL;  Surgeon: Lavena Bullion, DO;  Location: [facility];  Service: Gastroenterology;  Laterality: N/A;   ESOPHAGOGASTRODUODENOSCOPY (EGD) WITH PROPOFOL N/A 03/28/2021   Procedure: ESOPHAGOGASTRODUODENOSCOPY (EGD) WITH PROPOFOL;  Surgeon: Lavena Bullion, DO;  Location: [facility];  Service: Gastroenterology;  Laterality: N/A;   POLYPECTOMY  03/28/2021   Procedure: POLYPECTOMY;  Surgeon: Lavena Bullion, DO;  Location: [facility];  Service: Gastroenterology;;   stomach surgery  1990    Current Medications: No outpatient medications have been marked as taking for the 08/12/21 encounter (Appointment) with Donato Heinz, MD.   Current Facility-Administered Medications for the 08/12/21 encounter (Appointment) with Donato Heinz, MD  Medication    nitroGLYCERIN (NITROSTAT) SL tablet 0.4 mg     Allergies:   Patient has no known allergies.   Social History   Socioeconomic History   Marital status: Widowed    Spouse name: Not on file   Number of children: Not on file   Years of education: Not on file   Highest education level: Not on file  Occupational History   Not on file  Tobacco Use   Smoking status: Former    Packs/day: 0.50    Years: 55.00    Pack years: 27.50    Types: Cigarettes    Start date: 10/17/1963    Quit date: 01/15/2019    Years since quitting: 2.5   Smokeless tobacco: Never  Vaping Use   Vaping Use: Never used  Substance and Sexual Activity   Alcohol use: Never   Drug use: Never   Sexual activity: Not Currently  Other Topics Concern   Not on file  Social History Narrative   Not on file   Social Determinants of Health   Financial Resource Strain: Low Risk    Difficulty of Paying Living Expenses: Not very hard  Food Insecurity: No Food Insecurity   Worried About Charity fundraiser in the Last Year: Never true   [place] in the Last Year: Never true  Transportation Needs: No Transportation Needs   Lack of Transportation (Medical): No   Lack of Transportation (Non-Medical): No  Physical Activity: Inactive   Days of Exercise per Week: 0 days   Minutes of Exercise per Session: 0 min  Stress: Stress Concern Present   Feeling of Stress : To some extent  Social Connections: Moderately Isolated   Frequency of Communication with Friends and Family: More than three times a week   Frequency of Social Gatherings with Friends and Family: Twice a week   Attends Religious Services: 1 to 4 times per year   Active Member of [employer] or Organizations: No   Attends Archivist Meetings: Never   Marital Status: Widowed     Family History: The patient's family history includes Heart disease in her maternal aunt and mother; Hyperlipidemia in her maternal aunt and mother; Stroke in her maternal  aunt and mother.  ROS:   Please see the history of present illness.     All other systems reviewed and are negative.  EKGs/Labs/Other Studies Reviewed:    The following studies were reviewed today:   EKG:  EKG is ordered today.  The ekg ordered demonstrates normal sinus rhythm, rate 71, PVC, first-degree AV block, left atrial enlargement, poor R wave progression  Recent Labs: 12/22/2020: Magnesium 1.8 01/20/2021: TSH 2.02 01/25/2021: ALT 14 06/24/2021: B Natriuretic Peptide 67.3; BUN 23; Creatinine, Ser 1.40; Hemoglobin 14.6; Platelets 201; Potassium 4.1; Sodium 143  Recent Lipid Panel    Component Value Date/Time   CHOL 138 01/20/2021 1140   TRIG 119.0 01/20/2021 1140   HDL 46.50 01/20/2021 1140   CHOLHDL 3 01/20/2021 1140   VLDL 23.8 01/20/2021 1140   LDLCALC 68 01/20/2021 1140  Physical Exam:    VS:  There were no vitals taken for this visit.    Wt Readings from Last 3 Encounters:  07/04/21 237 lb 6.4 oz (107.7 kg)  03/29/21 233 lb 3.2 oz (105.8 kg)  03/24/21 230 lb (104.3 kg)     GEN:  in no acute distress HEENT: Normal NECK: No JVD; No carotid bruits CARDIAC: RRR, no murmurs, rubs, gallops RESPIRATORY:  Clear to auscultation without rales, wheezing or rhonchi  ABDOMEN: Soft, non-tender, non-distended MUSCULOSKELETAL:  No edema; No deformity  SKIN: Warm and dry NEUROLOGIC:  Alert and oriented x 3 PSYCHIATRIC:  Normal affect   ASSESSMENT:    No diagnosis found.   PLAN:     Hypertension: On carvedilol 25 mg twice daily, clonidine 0.2 mg twice daily, doxazosin 1 mg BID, amlodipine 10 mg daily, valsartan 160 mg daily. Appears controlled, continue current regimen.  Work-up of secondary hypertension included renal artery duplex showing bilateral renal artery stenosis 1 to 59%, normal TSH, sleep study with mild OSA not meeting criteria for CPAP, elevated aldosterone/renin ratio (43), elevated metanephrines.  Referred to endocrinology for further evaluation. Check  BMET.  Frequent PVCs/complete heart block:  Zio patch x14 days on 10/04/2020 showed 2 episodes of complete heart block, lasted 15 seconds, both occurring at night, frequent PVCs (11.5% of beats), one 4 beat episode of NSVT.   -PVC burden 12% of beats despite coreg and diltiazem, but also with short episodes of what appears to be complete heart block.  Referred to EP, no changes recommended  CAD: Status post MI and stent x2, last in 2017.   -Continue aspirin 81 mg daily, atorvastatin 80 mg daily  CVA: on aspirin, statin  Hyperlipidemia: LDL 64 on 08/12/2020.  On atorvastatin 80 mg daily  CKD stage III: Creatinine 1.3 on 02/07/2021. Follows with nephrology  OSA: Diagnosed in 2012, reports was never started on CPAP.  Sleep study 10/05/20 with mild OSA not meeting criteria for CPAP, along with overnight desaturations.  Overnight oximetry study showed low SpO2, started on nocturnal O2.  PAD: ABIs on 01/26/2021 showed right 0.87, left 0.82.  Lower extremity duplex showed 30 to 49% stenosis in the right SFA, total occlusion and distal peroneal artery and left greater than 50% stenosis in distal CFA, 30 to 49% stenosis in SFA.  She currently denies any symptoms of claudication, though has not been walking much -Continue aspirin 81 mg daily -Continue atorvastatin 80 mg daily usually -Recommend walking program, goal 30 minutes 5 days/week  Dysphagia: Reports sensation of food getting stuck when she swallows.  Referred to GI for evaluation, underwent EGD which showed esophageal stricture s/p dilatation   RTC in***  Medication Adjustments/Labs and Tests Ordered: Current medicines are reviewed at length with the patient today.  Concerns regarding medicines are outlined above.  No orders of the defined types were placed in this encounter.   No orders of the defined types were placed in this encounter.   There are no Patient Instructions on file for this visit.   Signed, Donato Heinz,  MD  08/11/2021 10:37 PM    [facility]

## 2021-08-12 ENCOUNTER — Ambulatory Visit: Payer: Medicare Other | Admitting: Cardiology

## 2021-08-15 ENCOUNTER — Ambulatory Visit: Payer: Medicare Other | Admitting: Pulmonary Disease

## 2021-08-15 ENCOUNTER — Encounter: Payer: Self-pay | Admitting: Pulmonary Disease

## 2021-08-15 ENCOUNTER — Other Ambulatory Visit: Payer: Self-pay

## 2021-08-15 VITALS — BP 116/62 | HR 59 | Temp 97.7°F | Ht 69.0 in | Wt 241.0 lb

## 2021-08-15 DIAGNOSIS — Z87891 Personal history of nicotine dependence: Secondary | ICD-10-CM | POA: Diagnosis not present

## 2021-08-15 DIAGNOSIS — J449 Chronic obstructive pulmonary disease, unspecified: Secondary | ICD-10-CM

## 2021-08-15 DIAGNOSIS — R918 Other nonspecific abnormal finding of lung field: Secondary | ICD-10-CM

## 2021-08-15 DIAGNOSIS — G4734 Idiopathic sleep related nonobstructive alveolar hypoventilation: Secondary | ICD-10-CM | POA: Diagnosis not present

## 2021-08-15 NOTE — Progress Notes (Signed)
Jessica [name]    [id number]    [birth year]  Primary Care Physician:Nche, Charlene Brooke, NP  Referring Physician: Flossie Buffy, NP [address] [place],  [place] [address]  Chief complaint: Follow-up for COPD, OSA  HPI: 75 year old with severe COPD, OSA She moved to [place] from [place] in July 2021 and is here to establish care Previously followed at [facility] in [place]  Has history of severe COPD with multiple exacerbations in the past Question of noncompliance with medication per office notes She was on Was on Anoro on 2018.  Inhaler changed to Bevespi, Trelegy in 2019.  Due to coverage with insurance she has changed to Advair. Sleep study in 2012 diagnosed with OSA but never started on CPAP.  She was referred for repeat sleep study but was not interested in getting this done Diagnosed with breast cancer in February 2019 with lumpectomy and radiation.  She is currently on hormonal therapy.  Chief complaint is dyspnea on exertion which is gradually getting worse.  Denies any cough, sputum proximal, fevers, chills  Sleep study in late 2021 did not show sleep apnea.  She did however have nocturnal desats and started on supplemental oxygen  Pets: No pets Occupation: Retired Psychologist, counselling Exposures: No known exposures.  No mold, hot tub, Jacuzzi Smoking history: 50-pack-year smoker.  Quit in 2020 Travel history: Previously lived in [place], [place] Relevant family history: No significant family history of lung disease  Interim history: She has tried and breztri at last visit but it made her breathing worse so she is back on trelegy. She was valve to see if he will qualify for portable concentrator but did not desat at last visit  States that trelegy is actually helping with her breathing.  No new complaints today  Outpatient Encounter Medications as of 08/15/2021  Medication Sig   albuterol (ACCUNEB) 1.25 MG/3ML  nebulizer solution Take 3 mLs (1.25 mg total) by nebulization every 6 (six) hours as needed for wheezing.   albuterol (VENTOLIN HFA) 108 (90 Base) MCG/ACT inhaler Inhale 2 puffs into the lungs every 6 (six) hours as needed for wheezing.   amLODipine (NORVASC) 10 MG tablet Take 1 tablet by mouth daily for blood pressure (Patient taking differently: Take 10 mg by mouth in the morning.)   anastrozole (ARIMIDEX) 1 MG tablet Take 1 tablet (1 mg total) by mouth daily. (Patient taking differently: Take 1 mg by mouth in the morning.)   aspirin EC 81 MG tablet Take 81 mg by mouth in the morning. Swallow whole.   atorvastatin (LIPITOR) 80 MG tablet TAKE 1 TABLET BY MOUTH DAILY FOR CHOLESTEROL   carvedilol (COREG) 25 MG tablet Take 1 tablet by mouth twice daily for blood pressure (Patient taking differently: Take 25 mg by mouth in the morning and at bedtime.)   cloNIDine (CATAPRES) 0.2 MG tablet Take 1 tablet by mouth twice daily for blood pressure (Patient taking differently: Take 0.2 mg by mouth 2 (two) times daily.)   Fluticasone-Umeclidin-Vilant (TRELEGY ELLIPTA) 200-62.5-25 MCG/INH AEPB Inhale 1 puff into the lungs daily.   ipratropium-albuterol (DUONEB) 0.5-2.5 (3) MG/3ML SOLN Use 3-4 times per day as needed for shortness of breath   sertraline (ZOLOFT) 50 MG tablet Take 1 tablet (50 mg total) by mouth daily. (Patient taking differently: Take 50 mg by mouth in the morning.)   traZODone (DESYREL) 50 MG tablet Take 50 mg by mouth at bedtime as needed for sleep.   valsartan (  DIOVAN) 160 MG tablet TAKE 1 TABLET BY MOUTH DAILY FOR BLOOD PRESSURE   zolpidem (AMBIEN) 5 MG tablet Take 1 tablet (5 mg total) by mouth at bedtime as needed for sleep.   doxazosin (CARDURA) 1 MG tablet Take 1 tablet by mouth twice daily for blood pressure (Patient not taking: Reported on 08/15/2021)   [DISCONTINUED] Budeson-Glycopyrrol-Formoterol (BREZTRI AEROSPHERE) 160-9-4.8 MCG/ACT AERO Inhale 1 puff into the lungs daily as needed  (respiratory issues).   [DISCONTINUED] Budeson-Glycopyrrol-Formoterol (BREZTRI AEROSPHERE) 160-9-4.8 MCG/ACT AERO Inhale 2 puffs into the lungs in the morning and at bedtime.   [DISCONTINUED] CLENPIQ 10-3.5-12 MG-GM -GM/160ML SOLN Take 320 mLs by mouth as directed.   Facility-Administered Encounter Medications as of 08/15/2021  Medication   nitroGLYCERIN (NITROSTAT) SL tablet 0.4 mg   Physical Exam: Blood pressure 116/62, pulse (!) 59, temperature 97.7 F (36.5 C), temperature source Oral, height 5\' 9"  (1.753 m), weight 241 lb (109.3 kg), SpO2 98 %. Gen:      No acute distress HEENT:  EOMI, sclera anicteric Neck:     No masses; no thyromegaly Lungs:    Clear to auscultation bilaterally; normal respiratory effort CV:         Regular rate and rhythm; no murmurs Abd:      + bowel sounds; soft, non-tender; no palpable masses, no distension Ext:    No edema; adequate peripheral perfusion Skin:      Warm and dry; no rash Neuro: alert and oriented x 3 Psych: normal mood and affect   Data Reviewed: Imaging: Chest x-ray 08/11/2020- No active cardiopulmonary disease. Cardiomegaly. Probable left CP angle scarring.  CT 07/14/20-mild emphysematous changes, atelectasis in the lingula, subcentimeter pulmonary nodules  CT chest 06/23/2021-emphysema, stable pulmonary nodules I reviewed the images personally  PFTs: 10/21/2018 FVC 2.03 [69%], FEV1 1.16 [51%], F/F 57, TLC 4.75 [93%], DLCO 19.3 [92%] Severe obstruction  10/12/2020 FVC 1.79 [64%], FEV1 1.20 [5%], F/F 67, TLC 4.65 [82%], DLCO 19.94 [90%] Severe obstruction  Labs: CBC 08/12/2020-WBC 9.3, eos 2.2%, absolute eosinophil count 213 IgE 10/12/2020-1648 Alpha-1 antitrypsin 10/12/2020-129, PI MM  Echocardiogram 12/23/2019 LVEF 55-60%, mild LVH, grade 2 diastolic dysfunction  Sleep: 10/05/2020-split-night sleep study-AHI 1.9   11/30/2020 Overnight oximetry on room air shows that she drops below 88% for greater than 30 minutes.  Would  recommend that she start 2 L of O2 at night.  Assessment:  Severe COPD, asthma Continues on Trelegy Duo nebs as needed  Consider biologic therapy as she likely has asthma with mild elevated peripheral eosinophils and elevated IgE.  She is however afraid of self injection, needles and would like to hold off for now  Did not qualify for portable oxygen concentrator  She will benefit from pulmonary rehab.  I discussed this in detail with her but she has transportation issues and cannot make the trip to the rehab center.  I encouraged her to start exercise regimen and weight loss with diet at home.  Lung nodule, ex-smoker CT from last month reviewed with emphysema and stable pulmonary nodules She was qualified for low-dose screening CT to start in September 2023  Nocturnal hypoxia Continue supplemental oxygen, no evidence of OSA and sleep study  Plan/Recommendations: Continue trelegy DuoNebs as needed Supplemental oxygen at night Refer for low-dose screening CT to start in September 2023  Return to clinic in 6 months  Marshell Garfinkel MD [facility] 08/15/2021, 10:47 AM  CC: Nche, Charlene Brooke, NP

## 2021-08-15 NOTE — Patient Instructions (Signed)
Continue trelegy DuoNebs as needed Supplemental oxygen at night Refer for low-dose screening CT to start in September 2023  Return to clinic in 6 months

## 2021-08-25 ENCOUNTER — Other Ambulatory Visit: Payer: Self-pay | Admitting: Cardiology

## 2021-08-25 DIAGNOSIS — I10 Essential (primary) hypertension: Secondary | ICD-10-CM

## 2021-10-23 ENCOUNTER — Other Ambulatory Visit: Payer: Self-pay | Admitting: Cardiology

## 2021-10-23 DIAGNOSIS — I10 Essential (primary) hypertension: Secondary | ICD-10-CM

## 2021-12-12 ENCOUNTER — Other Ambulatory Visit: Payer: Self-pay | Admitting: *Deleted

## 2021-12-12 DIAGNOSIS — J449 Chronic obstructive pulmonary disease, unspecified: Secondary | ICD-10-CM

## 2021-12-12 MED ORDER — ALBUTEROL SULFATE HFA 108 (90 BASE) MCG/ACT IN AERS: 2.0000 | INHALATION_SPRAY | Freq: Four times a day (QID) | RESPIRATORY_TRACT | 3 refills | Status: DC | PRN

## 2021-12-13 ENCOUNTER — Other Ambulatory Visit: Payer: Self-pay | Admitting: *Deleted

## 2021-12-13 DIAGNOSIS — J449 Chronic obstructive pulmonary disease, unspecified: Secondary | ICD-10-CM

## 2021-12-13 MED ORDER — ALBUTEROL SULFATE HFA 108 (90 BASE) MCG/ACT IN AERS: 2.0000 | INHALATION_SPRAY | Freq: Four times a day (QID) | RESPIRATORY_TRACT | 3 refills | Status: AC | PRN

## 2021-12-20 ENCOUNTER — Other Ambulatory Visit: Payer: Self-pay | Admitting: Cardiology

## 2021-12-20 DIAGNOSIS — I10 Essential (primary) hypertension: Secondary | ICD-10-CM

## 2022-01-18 ENCOUNTER — Other Ambulatory Visit: Payer: Self-pay | Admitting: Hematology & Oncology

## 2022-01-18 DIAGNOSIS — Z853 Personal history of malignant neoplasm of breast: Secondary | ICD-10-CM

## 2022-01-30 ENCOUNTER — Other Ambulatory Visit: Payer: Self-pay | Admitting: Cardiology

## 2022-01-30 DIAGNOSIS — I10 Essential (primary) hypertension: Secondary | ICD-10-CM

## 2022-02-05 ENCOUNTER — Other Ambulatory Visit: Payer: Self-pay | Admitting: Cardiology

## 2022-02-05 DIAGNOSIS — I10 Essential (primary) hypertension: Secondary | ICD-10-CM

## 2022-02-28 ENCOUNTER — Other Ambulatory Visit: Payer: Self-pay | Admitting: Cardiology

## 2022-02-28 DIAGNOSIS — I10 Essential (primary) hypertension: Secondary | ICD-10-CM

## 2022-03-17 DIAGNOSIS — G4734 Idiopathic sleep related nonobstructive alveolar hypoventilation: Secondary | ICD-10-CM | POA: Diagnosis not present

## 2022-03-17 DIAGNOSIS — I129 Hypertensive chronic kidney disease with stage 1 through stage 4 chronic kidney disease, or unspecified chronic kidney disease: Secondary | ICD-10-CM | POA: Diagnosis not present

## 2022-03-17 DIAGNOSIS — I739 Peripheral vascular disease, unspecified: Secondary | ICD-10-CM | POA: Diagnosis not present

## 2022-03-17 DIAGNOSIS — I2510 Atherosclerotic heart disease of native coronary artery without angina pectoris: Secondary | ICD-10-CM | POA: Diagnosis not present

## 2022-03-17 DIAGNOSIS — N2581 Secondary hyperparathyroidism of renal origin: Secondary | ICD-10-CM | POA: Diagnosis not present

## 2022-03-17 DIAGNOSIS — I252 Old myocardial infarction: Secondary | ICD-10-CM | POA: Diagnosis not present

## 2022-03-17 DIAGNOSIS — N1832 Chronic kidney disease, stage 3b: Secondary | ICD-10-CM | POA: Diagnosis not present

## 2022-03-17 DIAGNOSIS — E559 Vitamin D deficiency, unspecified: Secondary | ICD-10-CM | POA: Diagnosis not present

## 2022-03-17 DIAGNOSIS — J449 Chronic obstructive pulmonary disease, unspecified: Secondary | ICD-10-CM | POA: Diagnosis not present

## 2022-03-17 DIAGNOSIS — E7800 Pure hypercholesterolemia, unspecified: Secondary | ICD-10-CM | POA: Diagnosis not present

## 2022-03-17 DIAGNOSIS — R7303 Prediabetes: Secondary | ICD-10-CM | POA: Diagnosis not present

## 2022-03-23 ENCOUNTER — Ambulatory Visit: Payer: Medicare Other

## 2022-03-28 ENCOUNTER — Ambulatory Visit (INDEPENDENT_AMBULATORY_CARE_PROVIDER_SITE_OTHER): Payer: Medicare Other

## 2022-03-28 DIAGNOSIS — Z0000 Encounter for general adult medical examination without abnormal findings: Secondary | ICD-10-CM

## 2022-03-28 NOTE — Patient Instructions (Signed)
Jessica [name] , Thank you for taking time to come for your Medicare Wellness Visit. I appreciate your ongoing commitment to your health goals. Please review the following plan we discussed and let me know if I can assist you in the future.   Screening recommendations/referrals: Colonoscopy: 03/18/2021 Mammogram: declined per patient  Bone Density: declined per patient  Recommended yearly ophthalmology/optometry visit for glaucoma screening and checkup Recommended yearly dental visit for hygiene and checkup  Vaccinations: Influenza vaccine: completed  Pneumococcal vaccine: completed  Tdap vaccine: due  Shingles vaccine: will consider     Advanced directives: yes   Conditions/risks identified: none   Next appointment: none    Preventive Care 74 Years and Older, Female Preventive care refers to lifestyle choices and visits with your health care provider that can promote health and wellness. What does preventive care include? A yearly physical exam. This is also called an annual well check. Dental exams once or twice a year. Routine eye exams. Ask your health care provider how often you should have your eyes checked. Personal lifestyle choices, including: Daily care of your teeth and gums. Regular physical activity. Eating a healthy diet. Avoiding tobacco and drug use. Limiting alcohol use. Practicing safe sex. Taking low-dose aspirin every day. Taking vitamin and mineral supplements as recommended by your health care provider. What happens during an annual well check? The services and screenings done by your health care provider during your annual well check will depend on your age, overall health, lifestyle risk factors, and family history of disease. Counseling  Your health care provider may ask you questions about your: Alcohol use. Tobacco use. Drug use. Emotional well-being. Home and relationship well-being. Sexual activity. Eating habits. History of falls. Memory  and ability to understand (cognition). Work and work Statistician. Reproductive health. Screening  You may have the following tests or measurements: Height, weight, and BMI. Blood pressure. Lipid and cholesterol levels. These may be checked every 5 years, or more frequently if you are over 17 years old. Skin check. Lung cancer screening. You may have this screening every year starting at age 74 if you have a 30-pack-year history of smoking and currently smoke or have quit within the past 15 years. Fecal occult blood test (FOBT) of the stool. You may have this test every year starting at age 20. Flexible sigmoidoscopy or colonoscopy. You may have a sigmoidoscopy every 5 years or a colonoscopy every 10 years starting at age 74. Hepatitis C blood test. Hepatitis B blood test. Sexually transmitted disease (STD) testing. Diabetes screening. This is done by checking your blood sugar (glucose) after you have not eaten for a while (fasting). You may have this done every 1-3 years. Bone density scan. This is done to screen for osteoporosis. You may have this done starting at age 74. Mammogram. This may be done every 1-2 years. Talk to your health care provider about how often you should have regular mammograms. Talk with your health care provider about your test results, treatment options, and if necessary, the need for more tests. Vaccines  Your health care provider may recommend certain vaccines, such as: Influenza vaccine. This is recommended every year. Tetanus, diphtheria, and acellular pertussis (Tdap, Td) vaccine. You may need a Td booster every 10 years. Zoster vaccine. You may need this after age 30. Pneumococcal 13-valent conjugate (PCV13) vaccine. One dose is recommended after age 74. Pneumococcal polysaccharide (PPSV23) vaccine. One dose is recommended after age 72. Talk to your health care provider about which screenings  and vaccines you need and how often you need them. This  information is not intended to replace advice given to you by your health care provider. Make sure you discuss any questions you have with your health care provider. Document Released: 10/29/2015 Document Revised: 06/21/2016 Document Reviewed: 08/03/2015 Elsevier Interactive Patient Education  2017 [place] Prevention in the Home Falls can cause injuries. They can happen to people of all ages. There are many things you can do to make your home safe and to help prevent falls. What can I do on the outside of my home? Regularly fix the edges of walkways and driveways and fix any cracks. Remove anything that might make you trip as you walk through a door, such as a raised step or threshold. Trim any bushes or trees on the path to your home. Use bright outdoor lighting. Clear any walking paths of anything that might make someone trip, such as rocks or tools. Regularly check to see if handrails are loose or broken. Make sure that both sides of any steps have handrails. Any raised decks and porches should have guardrails on the edges. Have any leaves, snow, or ice cleared regularly. Use sand or salt on walking paths during winter. Clean up any spills in your garage right away. This includes oil or grease spills. What can I do in the bathroom? Use night lights. Install grab bars by the toilet and in the tub and shower. Do not use towel bars as grab bars. Use non-skid mats or decals in the tub or shower. If you need to sit down in the shower, use a plastic, non-slip stool. Keep the floor dry. Clean up any water that spills on the floor as soon as it happens. Remove soap buildup in the tub or shower regularly. Attach bath mats securely with double-sided non-slip rug tape. Do not have throw rugs and other things on the floor that can make you trip. What can I do in the bedroom? Use night lights. Make sure that you have a light by your bed that is easy to reach. Do not use any sheets or  blankets that are too big for your bed. They should not hang down onto the floor. Have a firm chair that has side arms. You can use this for support while you get dressed. Do not have throw rugs and other things on the floor that can make you trip. What can I do in the kitchen? Clean up any spills right away. Avoid walking on wet floors. Keep items that you use a lot in easy-to-reach places. If you need to reach something above you, use a strong step stool that has a grab bar. Keep electrical cords out of the way. Do not use floor polish or wax that makes floors slippery. If you must use wax, use non-skid floor wax. Do not have throw rugs and other things on the floor that can make you trip. What can I do with my stairs? Do not leave any items on the stairs. Make sure that there are handrails on both sides of the stairs and use them. Fix handrails that are broken or loose. Make sure that handrails are as long as the stairways. Check any carpeting to make sure that it is firmly attached to the stairs. Fix any carpet that is loose or worn. Avoid having throw rugs at the top or bottom of the stairs. If you do have throw rugs, attach them to the floor with carpet tape.  Make sure that you have a light switch at the top of the stairs and the bottom of the stairs. If you do not have them, ask someone to add them for you. What else can I do to help prevent falls? Wear shoes that: Do not have high heels. Have rubber bottoms. Are comfortable and fit you well. Are closed at the toe. Do not wear sandals. If you use a stepladder: Make sure that it is fully opened. Do not climb a closed stepladder. Make sure that both sides of the stepladder are locked into place. Ask someone to hold it for you, if possible. Clearly mark and make sure that you can see: Any grab bars or handrails. First and last steps. Where the edge of each step is. Use tools that help you move around (mobility aids) if they are  needed. These include: Canes. Walkers. Scooters. Crutches. Turn on the lights when you go into a dark area. Replace any light bulbs as soon as they burn out. Set up your furniture so you have a clear path. Avoid moving your furniture around. If any of your floors are uneven, fix them. If there are any pets around you, be aware of where they are. Review your medicines with your doctor. Some medicines can make you feel dizzy. This can increase your chance of falling. Ask your doctor what other things that you can do to help prevent falls. This information is not intended to replace advice given to you by your health care provider. Make sure you discuss any questions you have with your health care provider. Document Released: 07/29/2009 Document Revised: 03/09/2016 Document Reviewed: 11/06/2014 Elsevier Interactive Patient Education  2017 [employer].

## 2022-03-28 NOTE — Progress Notes (Signed)
Subjective:   Jessica [name] is a 76 y.o. female who presents for Medicare Annual (Subsequent) preventive examination.   I connected with Jessica [name]  today by telephone and verified that I am speaking with the correct person using two identifiers. Location patient: home Location provider: work Persons participating in the virtual visit: patient, provider.   I discussed the limitations, risks, security and privacy concerns of performing an evaluation and management service by telephone and the availability of in person appointments. I also discussed with the patient that there may be a patient responsible charge related to this service. The patient expressed understanding and verbally consented to this telephonic visit.    Interactive audio and video telecommunications were attempted between this provider and patient, however failed, due to patient having technical difficulties OR patient did not have access to video capability.  We continued and completed visit with audio only.    Review of Systems     Cardiac Risk Factors include: advanced age (>79mn, >>6women)     Objective:    Today's Vitals   There is no height or weight on file to calculate BMI.     03/28/2022   12:58 PM 03/22/2021   10:04 AM 01/25/2021    9:28 AM 10/05/2020    8:29 PM 08/25/2020    3:19 PM 08/11/2020    8:27 PM  Advanced Directives  Does Patient Have a Medical Advance Directive? Yes No Yes Yes Yes Yes  Type of AParamedicof [place]ar[place]Living will  [place] will Healthcare Power of [place]ulligentLiving will [place]Living will  Does patient want to make changes to medical advance directive?    No - Patient declined  No - Patient declined  Copy of [place] Chart? No - copy requested    No - copy requested No - copy requested  Would patient like information on creating a medical advance  directive?  No - Patient declined        Current Medications (verified) Outpatient Encounter Medications as of 03/28/2022  Medication Sig   albuterol (ACCUNEB) 1.25 MG/3ML nebulizer solution Take 3 mLs (1.25 mg total) by nebulization every 6 (six) hours as needed for wheezing.   albuterol (VENTOLIN HFA) 108 (90 Base) MCG/ACT inhaler Inhale 2 puffs into the lungs every 6 (six) hours as needed for wheezing or shortness of breath.   amLODipine (NORVASC) 10 MG tablet TAKE 1 TABLET BY MOUTH DAILY FOR BLOOD PRESSURE   anastrozole (ARIMIDEX) 1 MG tablet TAKE 1 TABLET BY MOUTH  DAILY   aspirin EC 81 MG tablet Take 81 mg by mouth in the morning. Swallow whole.   atorvastatin (LIPITOR) 80 MG tablet TAKE 1 TABLET BY MOUTH DAILY FOR CHOLESTEROL   carvedilol (COREG) 25 MG tablet TAKE 1 TABLET BY MOUTH TWICE  DAILY FOR BLOOD PRESSURE   cloNIDine (CATAPRES) 0.2 MG tablet PATIENT MUST SCHEDULE APPOINTMENT FOR FUTURE REFILLS Take 1 tablet by mouth twice daily for blood pressure   doxazosin (CARDURA) 1 MG tablet Take 1 tablet by mouth twice daily for blood pressure   Fluticasone-Umeclidin-Vilant (TRELEGY ELLIPTA) 200-62.5-25 MCG/INH AEPB Inhale 1 puff into the lungs daily.   ipratropium-albuterol (DUONEB) 0.5-2.5 (3) MG/3ML SOLN Use 3-4 times per day as needed for shortness of breath   sertraline (ZOLOFT) 50 MG tablet Take 1 tablet (50 mg total) by mouth daily. (Patient taking differently: Take 50 mg by mouth in the morning.)  traZODone (DESYREL) 50 MG tablet Take 50 mg by mouth at bedtime as needed for sleep.   valsartan (DIOVAN) 160 MG tablet TAKE 1 TABLET BY MOUTH  DAILY FOR BLOOD PRESSURE   zolpidem (AMBIEN) 5 MG tablet Take 1 tablet (5 mg total) by mouth at bedtime as needed for sleep.   Facility-Administered Encounter Medications as of 03/28/2022  Medication   nitroGLYCERIN (NITROSTAT) SL tablet 0.4 mg    Allergies (verified) Patient has no known allergies.   History: Past Medical History:   Diagnosis Date   Asthma    Cancer ([place])    CHF (congestive heart failure) ([facility])    Chronic kidney disease    COPD (chronic obstructive pulmonary disease) ([place])    Goals of care, counseling/discussion 08/25/2020   Hyperlipidemia    Hypertension    Pre-diabetes    Sleep apnea    Stage I breast cancer, left ([place]) 08/25/2020   Stroke [facility])    Past Surgical History:  Procedure Laterality Date   BALLOON DILATION N/A 03/28/2021   Procedure: BALLOON DILATION;  Surgeon: Lavena Bullion, DO;  Location: [facility];  Service: Gastroenterology;  Laterality: N/A;   BIOPSY  03/28/2021   Procedure: BIOPSY;  Surgeon: Lavena Bullion, DO;  Location: [facility];  Service: Gastroenterology;;   COLONOSCOPY WITH PROPOFOL N/A 03/28/2021   Procedure: COLONOSCOPY WITH PROPOFOL;  Surgeon: Lavena Bullion, DO;  Location: [facility];  Service: Gastroenterology;  Laterality: N/A;   ESOPHAGOGASTRODUODENOSCOPY (EGD) WITH PROPOFOL N/A 03/28/2021   Procedure: ESOPHAGOGASTRODUODENOSCOPY (EGD) WITH PROPOFOL;  Surgeon: Lavena Bullion, DO;  Location: [facility];  Service: Gastroenterology;  Laterality: N/A;   POLYPECTOMY  03/28/2021   Procedure: POLYPECTOMY;  Surgeon: Lavena Bullion, DO;  Location: [facility];  Service: Gastroenterology;;   stomach surgery  1990   Family History  Problem Relation Age of Onset   Stroke Mother    Heart disease Mother    Hyperlipidemia Mother    Heart disease Maternal Aunt    Hyperlipidemia Maternal Aunt    Stroke Maternal Aunt    Social History   Socioeconomic History   Marital status: Widowed    Spouse name: Not on file   Number of children: Not on file   Years of education: Not on file   Highest education level: Not on file  Occupational History   Not on file  Tobacco Use   Smoking status: Former    Packs/day: 0.50    Years: 55.00    Total pack years: 27.50    Types: Cigarettes    Start date: 10/17/1963    Quit date: 01/15/2019    Years since  quitting: 3.2   Smokeless tobacco: Never  Vaping Use   Vaping Use: Never used  Substance and Sexual Activity   Alcohol use: Never   Drug use: Never   Sexual activity: Not Currently  Other Topics Concern   Not on file  Social History Narrative   Not on file   Social Determinants of Health   Financial Resource Strain: Low Risk  (03/28/2022)   Overall Financial Resource Strain (CARDIA)    Difficulty of Paying Living Expenses: Not hard at all  Food Insecurity: No Food Insecurity (03/28/2022)   Hunger Vital Sign    Worried About Running Out of Food in the Last Year: Never true    [place] in the Last Year: Never true  Transportation Needs: No Transportation Needs (03/28/2022)   PRAPARE - Transportation    Lack of  Transportation (Medical): No    Lack of Transportation (Non-Medical): No  Physical Activity: Inactive (03/28/2022)   Exercise Vital Sign    Days of Exercise per Week: 0 days    Minutes of Exercise per Session: 0 min  Stress: No Stress Concern Present (03/28/2022)   [place]    Feeling of Stress : Not at all  Social Connections: Moderately Isolated (03/28/2022)   Social Connection and Isolation Panel [NHANES]    Frequency of Communication with Friends and Family: Three times a week    Frequency of Social Gatherings with Friends and Family: Three times a week    Attends Religious Services: More than 4 times per year    Active Member of Clubs or Organizations: No    Attends Archivist Meetings: Never    Marital Status: Widowed    Tobacco Counseling Counseling given: Not Answered   Clinical Intake:  Pre-visit preparation completed: Yes  Pain : No/denies pain     Nutritional Risks: None Diabetes: No  How often do you need to have someone help you when you read instructions, pamphlets, or other written materials from your doctor or pharmacy?: 1 - Never What is the last grade level  you completed in school?: 10th grade  Diabetic?no   Interpreter Needed?: No  Information entered by :: L.Mckala Pantaleon,LPN   Activities of Daily Living    03/28/2022    1:02 PM  In your present state of health, do you have any difficulty performing the following activities:  Hearing? 0  Vision? 0  Difficulty concentrating or making decisions? 0  Walking or climbing stairs? 0  Dressing or bathing? 0  Doing errands, shopping? 0  Preparing Food and eating ? N  Using the Toilet? N  In the past six months, have you accidently leaked urine? N  Do you have problems with loss of bowel control? N  Managing your Medications? N  Managing your Finances? N    Patient Care Team: Nche, Charlene Brooke, NP as PCP - General (Internal Medicine) Donato Heinz, MD as PCP - Cardiology (Cardiology)  Indicate any recent Medical Services you may have received from other than [facility] providers in the past year (date may be approximate).     Assessment:   This is a routine wellness examination for Amali.  Hearing/Vision screen Vision Screening - Comments:: Declined   Dietary issues and exercise activities discussed: Current Exercise Habits: The patient does not participate in regular exercise at present, Exercise limited by: orthopedic condition(s)   Goals Addressed   None    Depression Screen    03/28/2022   12:59 PM 03/28/2022   12:56 PM 03/22/2021   10:12 AM  PHQ 2/9 Scores  PHQ - 2 Score 0 0 6  PHQ- 9 Score   16    Fall Risk    03/28/2022   12:59 PM 03/22/2021   10:07 AM  Fall Risk   Falls in the past year? 1 0  Number falls in past yr: 1 0  Injury with Fall? 0 0  Risk for fall due to : Orthopedic patient   Follow up Falls evaluation completed;Education provided Falls evaluation completed;Falls prevention discussed  Comment use cane     FALL RISK PREVENTION PERTAINING TO THE HOME:  Any stairs in or around the home? No  If so, are there any without handrails? No  Home free  of loose throw rugs in walkways, pet beds, electrical cords, etc?  Yes  Adequate lighting in your home to reduce risk of falls? Yes   ASSISTIVE DEVICES UTILIZED TO PREVENT FALLS:  Life alert? No  Use of a cane, walker or w/c? No  Grab bars in the bathroom? No  Shower chair or bench in shower? No  Elevated toilet seat or a handicapped toilet? No    Cognitive Function:    Normal cognitive status assessed by telephone conversation  by this Nurse Health Advisor. No abnormalities found.      Immunizations Immunization History  Administered Date(s) Administered   Fluad Quad(high Dose 65+) 07/21/2020   PFIZER(Purple Top)SARS-COV-2 Vaccination 12/12/2019, 12/23/2019, 08/23/2020   Pneumococcal Polysaccharide-23 10/12/2020    TDAP status: Due, Education has been provided regarding the importance of this vaccine. Advised may receive this vaccine at local pharmacy or Health Dept. Aware to provide a copy of the vaccination record if obtained from local pharmacy or Health Dept. Verbalized acceptance and understanding.  Flu Vaccine status: Up to date  Pneumococcal vaccine status: Up to date  Covid-19 vaccine status: Completed vaccines  Qualifies for Shingles Vaccine? Yes   Zostavax completed Yes   Shingrix Completed?: Yes  Screening Tests Health Maintenance  Topic Date Due   MAMMOGRAM  Never done   DEXA SCAN  Never done   Zoster Vaccines- Shingrix (2 of 2) 06/28/2018   Pneumonia Vaccine 27+ Years old (2 - PCV) 10/12/2021   TETANUS/TDAP  07/04/2022 (Originally 09/05/1965)   Hepatitis C Screening  07/04/2022 (Originally 09/04/1964)   INFLUENZA VACCINE  05/16/2022   COLONOSCOPY (Pts 45-68yr Insurance coverage will need to be confirmed)  03/29/2031   COVID-19 Vaccine  Completed   HPV VACCINES  Aged Out    Health Maintenance  Health Maintenance Due  Topic Date Due   MAMMOGRAM  Never done   DEXA SCAN  Never done   Zoster Vaccines- Shingrix (2 of 2) 06/28/2018   Pneumonia  Vaccine 74 Years old (2 - PCV) 10/12/2021    Colorectal cancer screening: Type of screening: Colonoscopy. Completed 03/18/2021. Repeat every 10 years  Mammogram status: Ordered declined . Pt provided with contact info and advised to call to schedule appt.   Bone Density status: Ordered declined . Pt provided with contact info and advised to call to schedule appt.  Lung Cancer Screening: (Low Dose CT Chest recommended if Age 74-80years, 30 pack-year currently smoking OR have quit w/in 15years.) does not qualify.   Lung Cancer Screening Referral: n/a  Additional Screening:  Hepatitis C Screening: does not qualify;   Vision Screening: Recommended annual ophthalmology exams for early detection of glaucoma and other disorders of the eye. Is the patient up to date with their annual eye exam?  No  Who is the provider or what is the name of the office in which the patient attends annual eye exams? Declined at this time  If pt is not established with a provider, would they like to be referred to a provider to establish care? No .   Dental Screening: Recommended annual dental exams for proper oral hygiene  Community Resource Referral / Chronic Care Management: CRR required this visit?  No   CCM required this visit?  No      Plan:     I have personally reviewed and noted the following in the patient's chart:   Medical and social history Use of alcohol, tobacco or illicit drugs  Current medications and supplements including opioid prescriptions.  Functional ability and status Nutritional status Physical activity Advanced directives  List of other physicians Hospitalizations, surgeries, and ER visits in previous 12 months Vitals Screenings to include cognitive, depression, and falls Referrals and appointments  In addition, I have reviewed and discussed with patient certain preventive protocols, quality metrics, and best practice recommendations. A written personalized care plan  for preventive services as well as general preventive health recommendations were provided to patient.     Randel Pigg, LPN   03/14/510   Nurse Notes: none

## 2022-03-31 DIAGNOSIS — I739 Peripheral vascular disease, unspecified: Secondary | ICD-10-CM | POA: Diagnosis not present

## 2022-03-31 DIAGNOSIS — N1832 Chronic kidney disease, stage 3b: Secondary | ICD-10-CM | POA: Diagnosis not present

## 2022-03-31 DIAGNOSIS — J449 Chronic obstructive pulmonary disease, unspecified: Secondary | ICD-10-CM | POA: Diagnosis not present

## 2022-03-31 DIAGNOSIS — M5442 Lumbago with sciatica, left side: Secondary | ICD-10-CM | POA: Diagnosis not present

## 2022-03-31 DIAGNOSIS — I7143 Infrarenal abdominal aortic aneurysm, without rupture: Secondary | ICD-10-CM | POA: Diagnosis not present

## 2022-03-31 DIAGNOSIS — G8929 Other chronic pain: Secondary | ICD-10-CM | POA: Diagnosis not present

## 2022-03-31 DIAGNOSIS — R7303 Prediabetes: Secondary | ICD-10-CM | POA: Diagnosis not present

## 2022-03-31 DIAGNOSIS — E7800 Pure hypercholesterolemia, unspecified: Secondary | ICD-10-CM | POA: Diagnosis not present

## 2022-03-31 DIAGNOSIS — E559 Vitamin D deficiency, unspecified: Secondary | ICD-10-CM | POA: Diagnosis not present

## 2022-03-31 DIAGNOSIS — Z0000 Encounter for general adult medical examination without abnormal findings: Secondary | ICD-10-CM | POA: Diagnosis not present

## 2022-03-31 DIAGNOSIS — I129 Hypertensive chronic kidney disease with stage 1 through stage 4 chronic kidney disease, or unspecified chronic kidney disease: Secondary | ICD-10-CM | POA: Diagnosis not present

## 2022-03-31 DIAGNOSIS — N183 Chronic kidney disease, stage 3 unspecified: Secondary | ICD-10-CM | POA: Diagnosis not present

## 2022-03-31 DIAGNOSIS — N2581 Secondary hyperparathyroidism of renal origin: Secondary | ICD-10-CM | POA: Diagnosis not present

## 2022-03-31 DIAGNOSIS — H9193 Unspecified hearing loss, bilateral: Secondary | ICD-10-CM | POA: Diagnosis not present

## 2022-03-31 DIAGNOSIS — C50912 Malignant neoplasm of unspecified site of left female breast: Secondary | ICD-10-CM | POA: Diagnosis not present

## 2022-04-02 ENCOUNTER — Other Ambulatory Visit: Payer: Self-pay | Admitting: Primary Care

## 2022-04-07 DIAGNOSIS — F17210 Nicotine dependence, cigarettes, uncomplicated: Secondary | ICD-10-CM | POA: Diagnosis not present

## 2022-04-07 DIAGNOSIS — R0602 Shortness of breath: Secondary | ICD-10-CM | POA: Diagnosis not present

## 2022-04-07 DIAGNOSIS — J439 Emphysema, unspecified: Secondary | ICD-10-CM | POA: Diagnosis not present

## 2022-04-11 DIAGNOSIS — J439 Emphysema, unspecified: Secondary | ICD-10-CM | POA: Diagnosis not present

## 2022-04-12 ENCOUNTER — Other Ambulatory Visit: Payer: Self-pay | Admitting: Cardiology

## 2022-04-17 DIAGNOSIS — H903 Sensorineural hearing loss, bilateral: Secondary | ICD-10-CM | POA: Diagnosis not present

## 2022-04-20 DIAGNOSIS — R001 Bradycardia, unspecified: Secondary | ICD-10-CM | POA: Diagnosis not present

## 2022-04-20 DIAGNOSIS — I7143 Infrarenal abdominal aortic aneurysm, without rupture: Secondary | ICD-10-CM | POA: Diagnosis not present

## 2022-04-20 DIAGNOSIS — I2510 Atherosclerotic heart disease of native coronary artery without angina pectoris: Secondary | ICD-10-CM | POA: Diagnosis not present

## 2022-04-20 DIAGNOSIS — J432 Centrilobular emphysema: Secondary | ICD-10-CM | POA: Diagnosis not present

## 2022-04-20 DIAGNOSIS — R55 Syncope and collapse: Secondary | ICD-10-CM | POA: Diagnosis not present

## 2022-04-27 DIAGNOSIS — Z1231 Encounter for screening mammogram for malignant neoplasm of breast: Secondary | ICD-10-CM | POA: Diagnosis not present

## 2022-04-27 DIAGNOSIS — R001 Bradycardia, unspecified: Secondary | ICD-10-CM | POA: Diagnosis not present

## 2022-04-27 DIAGNOSIS — Z853 Personal history of malignant neoplasm of breast: Secondary | ICD-10-CM | POA: Diagnosis not present

## 2022-04-27 DIAGNOSIS — R55 Syncope and collapse: Secondary | ICD-10-CM | POA: Diagnosis not present

## 2022-04-27 DIAGNOSIS — R262 Difficulty in walking, not elsewhere classified: Secondary | ICD-10-CM | POA: Diagnosis not present

## 2022-04-27 DIAGNOSIS — Z79899 Other long term (current) drug therapy: Secondary | ICD-10-CM | POA: Diagnosis not present

## 2022-04-27 DIAGNOSIS — J449 Chronic obstructive pulmonary disease, unspecified: Secondary | ICD-10-CM | POA: Diagnosis not present

## 2022-04-27 DIAGNOSIS — R921 Mammographic calcification found on diagnostic imaging of breast: Secondary | ICD-10-CM | POA: Diagnosis not present

## 2022-04-28 DIAGNOSIS — M109 Gout, unspecified: Secondary | ICD-10-CM | POA: Diagnosis not present

## 2022-04-28 DIAGNOSIS — E559 Vitamin D deficiency, unspecified: Secondary | ICD-10-CM | POA: Diagnosis not present

## 2022-04-28 DIAGNOSIS — N1832 Chronic kidney disease, stage 3b: Secondary | ICD-10-CM | POA: Diagnosis not present

## 2022-04-28 DIAGNOSIS — E1122 Type 2 diabetes mellitus with diabetic chronic kidney disease: Secondary | ICD-10-CM | POA: Diagnosis not present

## 2022-04-28 DIAGNOSIS — F32A Depression, unspecified: Secondary | ICD-10-CM | POA: Diagnosis not present

## 2022-04-28 DIAGNOSIS — I7143 Infrarenal abdominal aortic aneurysm, without rupture: Secondary | ICD-10-CM | POA: Diagnosis not present

## 2022-05-01 DIAGNOSIS — N641 Fat necrosis of breast: Secondary | ICD-10-CM | POA: Diagnosis not present

## 2022-05-01 DIAGNOSIS — N6489 Other specified disorders of breast: Secondary | ICD-10-CM | POA: Diagnosis not present

## 2022-05-01 DIAGNOSIS — R921 Mammographic calcification found on diagnostic imaging of breast: Secondary | ICD-10-CM | POA: Diagnosis not present

## 2022-05-01 DIAGNOSIS — R928 Other abnormal and inconclusive findings on diagnostic imaging of breast: Secondary | ICD-10-CM | POA: Diagnosis not present

## 2022-05-09 DIAGNOSIS — M7989 Other specified soft tissue disorders: Secondary | ICD-10-CM | POA: Diagnosis not present

## 2022-05-09 DIAGNOSIS — Z853 Personal history of malignant neoplasm of breast: Secondary | ICD-10-CM | POA: Diagnosis not present

## 2022-05-09 DIAGNOSIS — M545 Low back pain, unspecified: Secondary | ICD-10-CM | POA: Diagnosis not present

## 2022-05-09 DIAGNOSIS — R221 Localized swelling, mass and lump, neck: Secondary | ICD-10-CM | POA: Diagnosis not present

## 2022-05-11 DIAGNOSIS — J439 Emphysema, unspecified: Secondary | ICD-10-CM | POA: Diagnosis not present

## 2022-05-12 ENCOUNTER — Other Ambulatory Visit: Payer: Self-pay | Admitting: Cardiology

## 2022-05-18 DIAGNOSIS — J432 Centrilobular emphysema: Secondary | ICD-10-CM | POA: Diagnosis not present

## 2022-05-18 DIAGNOSIS — M545 Low back pain, unspecified: Secondary | ICD-10-CM | POA: Diagnosis not present

## 2022-05-18 DIAGNOSIS — R221 Localized swelling, mass and lump, neck: Secondary | ICD-10-CM | POA: Diagnosis not present

## 2022-05-18 DIAGNOSIS — I700 Atherosclerosis of aorta: Secondary | ICD-10-CM | POA: Diagnosis not present

## 2022-05-18 DIAGNOSIS — M7989 Other specified soft tissue disorders: Secondary | ICD-10-CM | POA: Diagnosis not present

## 2022-05-18 DIAGNOSIS — F17210 Nicotine dependence, cigarettes, uncomplicated: Secondary | ICD-10-CM | POA: Diagnosis not present

## 2022-05-18 DIAGNOSIS — I3139 Other pericardial effusion (noninflammatory): Secondary | ICD-10-CM | POA: Diagnosis not present

## 2022-05-18 DIAGNOSIS — Z853 Personal history of malignant neoplasm of breast: Secondary | ICD-10-CM | POA: Diagnosis not present

## 2022-05-18 DIAGNOSIS — Z87891 Personal history of nicotine dependence: Secondary | ICD-10-CM | POA: Diagnosis not present

## 2022-05-18 DIAGNOSIS — Z122 Encounter for screening for malignant neoplasm of respiratory organs: Secondary | ICD-10-CM | POA: Diagnosis not present

## 2022-06-02 DIAGNOSIS — N1832 Chronic kidney disease, stage 3b: Secondary | ICD-10-CM | POA: Diagnosis not present

## 2022-06-02 DIAGNOSIS — R221 Localized swelling, mass and lump, neck: Secondary | ICD-10-CM | POA: Diagnosis not present

## 2022-06-02 DIAGNOSIS — E119 Type 2 diabetes mellitus without complications: Secondary | ICD-10-CM | POA: Diagnosis not present

## 2022-06-02 DIAGNOSIS — Z1211 Encounter for screening for malignant neoplasm of colon: Secondary | ICD-10-CM | POA: Diagnosis not present

## 2022-06-02 DIAGNOSIS — M889 Osteitis deformans of unspecified bone: Secondary | ICD-10-CM | POA: Diagnosis not present

## 2022-06-02 DIAGNOSIS — E1122 Type 2 diabetes mellitus with diabetic chronic kidney disease: Secondary | ICD-10-CM | POA: Diagnosis not present

## 2022-06-06 ENCOUNTER — Other Ambulatory Visit: Payer: Self-pay | Admitting: Cardiology

## 2022-06-06 DIAGNOSIS — I10 Essential (primary) hypertension: Secondary | ICD-10-CM

## 2022-06-09 DIAGNOSIS — R9431 Abnormal electrocardiogram [ECG] [EKG]: Secondary | ICD-10-CM | POA: Diagnosis not present

## 2022-06-09 DIAGNOSIS — Z79899 Other long term (current) drug therapy: Secondary | ICD-10-CM | POA: Diagnosis not present

## 2022-06-09 DIAGNOSIS — Z87891 Personal history of nicotine dependence: Secondary | ICD-10-CM | POA: Diagnosis not present

## 2022-06-09 DIAGNOSIS — I517 Cardiomegaly: Secondary | ICD-10-CM | POA: Diagnosis not present

## 2022-06-09 DIAGNOSIS — I493 Ventricular premature depolarization: Secondary | ICD-10-CM | POA: Diagnosis not present

## 2022-06-09 DIAGNOSIS — I440 Atrioventricular block, first degree: Secondary | ICD-10-CM | POA: Diagnosis not present

## 2022-06-09 DIAGNOSIS — I4729 Other ventricular tachycardia: Secondary | ICD-10-CM | POA: Diagnosis not present

## 2022-06-11 DIAGNOSIS — J439 Emphysema, unspecified: Secondary | ICD-10-CM | POA: Diagnosis not present

## 2022-06-13 DIAGNOSIS — E042 Nontoxic multinodular goiter: Secondary | ICD-10-CM | POA: Diagnosis not present

## 2022-06-13 DIAGNOSIS — R221 Localized swelling, mass and lump, neck: Secondary | ICD-10-CM | POA: Diagnosis not present

## 2022-06-20 DIAGNOSIS — E119 Type 2 diabetes mellitus without complications: Secondary | ICD-10-CM | POA: Diagnosis not present

## 2022-06-20 DIAGNOSIS — M2012 Hallux valgus (acquired), left foot: Secondary | ICD-10-CM | POA: Diagnosis not present

## 2022-06-20 DIAGNOSIS — M2011 Hallux valgus (acquired), right foot: Secondary | ICD-10-CM | POA: Diagnosis not present

## 2022-06-20 DIAGNOSIS — B351 Tinea unguium: Secondary | ICD-10-CM | POA: Diagnosis not present

## 2022-06-20 DIAGNOSIS — L600 Ingrowing nail: Secondary | ICD-10-CM | POA: Diagnosis not present

## 2022-06-20 DIAGNOSIS — M21612 Bunion of left foot: Secondary | ICD-10-CM | POA: Diagnosis not present

## 2022-06-20 DIAGNOSIS — M21611 Bunion of right foot: Secondary | ICD-10-CM | POA: Diagnosis not present

## 2022-06-20 DIAGNOSIS — L608 Other nail disorders: Secondary | ICD-10-CM | POA: Diagnosis not present

## 2022-06-26 DIAGNOSIS — H903 Sensorineural hearing loss, bilateral: Secondary | ICD-10-CM | POA: Diagnosis not present

## 2022-07-12 DIAGNOSIS — J439 Emphysema, unspecified: Secondary | ICD-10-CM | POA: Diagnosis not present

## 2022-08-07 DIAGNOSIS — I252 Old myocardial infarction: Secondary | ICD-10-CM | POA: Diagnosis not present

## 2022-08-07 DIAGNOSIS — Z853 Personal history of malignant neoplasm of breast: Secondary | ICD-10-CM | POA: Diagnosis not present

## 2022-08-07 DIAGNOSIS — I440 Atrioventricular block, first degree: Secondary | ICD-10-CM | POA: Diagnosis not present

## 2022-08-07 DIAGNOSIS — I493 Ventricular premature depolarization: Secondary | ICD-10-CM | POA: Diagnosis not present

## 2022-08-07 DIAGNOSIS — Z79899 Other long term (current) drug therapy: Secondary | ICD-10-CM | POA: Diagnosis not present

## 2022-08-07 DIAGNOSIS — I5022 Chronic systolic (congestive) heart failure: Secondary | ICD-10-CM | POA: Diagnosis not present

## 2022-08-07 DIAGNOSIS — Z7984 Long term (current) use of oral hypoglycemic drugs: Secondary | ICD-10-CM | POA: Diagnosis not present

## 2022-08-07 DIAGNOSIS — Z9889 Other specified postprocedural states: Secondary | ICD-10-CM | POA: Diagnosis not present

## 2022-08-07 DIAGNOSIS — Z8673 Personal history of transient ischemic attack (TIA), and cerebral infarction without residual deficits: Secondary | ICD-10-CM | POA: Diagnosis not present

## 2022-08-07 DIAGNOSIS — N1832 Chronic kidney disease, stage 3b: Secondary | ICD-10-CM | POA: Diagnosis not present

## 2022-08-07 DIAGNOSIS — Z955 Presence of coronary angioplasty implant and graft: Secondary | ICD-10-CM | POA: Diagnosis not present

## 2022-08-07 DIAGNOSIS — E7800 Pure hypercholesterolemia, unspecified: Secondary | ICD-10-CM | POA: Diagnosis not present

## 2022-08-07 DIAGNOSIS — E1122 Type 2 diabetes mellitus with diabetic chronic kidney disease: Secondary | ICD-10-CM | POA: Diagnosis not present

## 2022-08-07 DIAGNOSIS — Z923 Personal history of irradiation: Secondary | ICD-10-CM | POA: Diagnosis not present

## 2022-08-07 DIAGNOSIS — Z9221 Personal history of antineoplastic chemotherapy: Secondary | ICD-10-CM | POA: Diagnosis not present

## 2022-08-07 DIAGNOSIS — Z87891 Personal history of nicotine dependence: Secondary | ICD-10-CM | POA: Diagnosis not present

## 2022-08-07 DIAGNOSIS — I130 Hypertensive heart and chronic kidney disease with heart failure and stage 1 through stage 4 chronic kidney disease, or unspecified chronic kidney disease: Secondary | ICD-10-CM | POA: Diagnosis not present

## 2022-08-07 DIAGNOSIS — I2510 Atherosclerotic heart disease of native coronary artery without angina pectoris: Secondary | ICD-10-CM | POA: Diagnosis not present

## 2022-08-07 DIAGNOSIS — I4729 Other ventricular tachycardia: Secondary | ICD-10-CM | POA: Diagnosis not present

## 2022-08-08 DIAGNOSIS — I130 Hypertensive heart and chronic kidney disease with heart failure and stage 1 through stage 4 chronic kidney disease, or unspecified chronic kidney disease: Secondary | ICD-10-CM | POA: Diagnosis not present

## 2022-08-08 DIAGNOSIS — Z923 Personal history of irradiation: Secondary | ICD-10-CM | POA: Diagnosis not present

## 2022-08-08 DIAGNOSIS — Z7984 Long term (current) use of oral hypoglycemic drugs: Secondary | ICD-10-CM | POA: Diagnosis not present

## 2022-08-08 DIAGNOSIS — Z8673 Personal history of transient ischemic attack (TIA), and cerebral infarction without residual deficits: Secondary | ICD-10-CM | POA: Diagnosis not present

## 2022-08-08 DIAGNOSIS — I440 Atrioventricular block, first degree: Secondary | ICD-10-CM | POA: Diagnosis not present

## 2022-08-08 DIAGNOSIS — Z955 Presence of coronary angioplasty implant and graft: Secondary | ICD-10-CM | POA: Diagnosis not present

## 2022-08-08 DIAGNOSIS — Z9889 Other specified postprocedural states: Secondary | ICD-10-CM | POA: Diagnosis not present

## 2022-08-08 DIAGNOSIS — E7800 Pure hypercholesterolemia, unspecified: Secondary | ICD-10-CM | POA: Diagnosis not present

## 2022-08-08 DIAGNOSIS — E1122 Type 2 diabetes mellitus with diabetic chronic kidney disease: Secondary | ICD-10-CM | POA: Diagnosis not present

## 2022-08-08 DIAGNOSIS — I4729 Other ventricular tachycardia: Secondary | ICD-10-CM | POA: Diagnosis not present

## 2022-08-08 DIAGNOSIS — I252 Old myocardial infarction: Secondary | ICD-10-CM | POA: Diagnosis not present

## 2022-08-08 DIAGNOSIS — I2510 Atherosclerotic heart disease of native coronary artery without angina pectoris: Secondary | ICD-10-CM | POA: Diagnosis not present

## 2022-08-08 DIAGNOSIS — Z79899 Other long term (current) drug therapy: Secondary | ICD-10-CM | POA: Diagnosis not present

## 2022-08-08 DIAGNOSIS — I441 Atrioventricular block, second degree: Secondary | ICD-10-CM | POA: Diagnosis not present

## 2022-08-08 DIAGNOSIS — N1832 Chronic kidney disease, stage 3b: Secondary | ICD-10-CM | POA: Diagnosis not present

## 2022-08-08 DIAGNOSIS — I5022 Chronic systolic (congestive) heart failure: Secondary | ICD-10-CM | POA: Diagnosis not present

## 2022-08-08 DIAGNOSIS — Z01810 Encounter for preprocedural cardiovascular examination: Secondary | ICD-10-CM | POA: Diagnosis not present

## 2022-08-08 DIAGNOSIS — I493 Ventricular premature depolarization: Secondary | ICD-10-CM | POA: Diagnosis not present

## 2022-08-08 DIAGNOSIS — Z9221 Personal history of antineoplastic chemotherapy: Secondary | ICD-10-CM | POA: Diagnosis not present

## 2022-08-08 DIAGNOSIS — Z87891 Personal history of nicotine dependence: Secondary | ICD-10-CM | POA: Diagnosis not present

## 2022-08-08 DIAGNOSIS — Z853 Personal history of malignant neoplasm of breast: Secondary | ICD-10-CM | POA: Diagnosis not present

## 2022-08-11 DIAGNOSIS — J439 Emphysema, unspecified: Secondary | ICD-10-CM | POA: Diagnosis not present

## 2022-08-15 DIAGNOSIS — Z853 Personal history of malignant neoplasm of breast: Secondary | ICD-10-CM | POA: Diagnosis not present

## 2022-08-15 DIAGNOSIS — E041 Nontoxic single thyroid nodule: Secondary | ICD-10-CM | POA: Diagnosis not present

## 2022-08-15 DIAGNOSIS — R221 Localized swelling, mass and lump, neck: Secondary | ICD-10-CM | POA: Diagnosis not present

## 2022-08-15 DIAGNOSIS — M545 Low back pain, unspecified: Secondary | ICD-10-CM | POA: Diagnosis not present

## 2022-08-15 DIAGNOSIS — M7989 Other specified soft tissue disorders: Secondary | ICD-10-CM | POA: Diagnosis not present

## 2022-08-28 DIAGNOSIS — I493 Ventricular premature depolarization: Secondary | ICD-10-CM | POA: Diagnosis not present

## 2022-08-28 DIAGNOSIS — I441 Atrioventricular block, second degree: Secondary | ICD-10-CM | POA: Diagnosis not present

## 2022-09-11 DIAGNOSIS — J439 Emphysema, unspecified: Secondary | ICD-10-CM | POA: Diagnosis not present

## 2022-09-29 DIAGNOSIS — N6321 Unspecified lump in the left breast, upper outer quadrant: Secondary | ICD-10-CM | POA: Diagnosis not present

## 2022-09-29 DIAGNOSIS — N644 Mastodynia: Secondary | ICD-10-CM | POA: Diagnosis not present

## 2022-09-29 DIAGNOSIS — Z853 Personal history of malignant neoplasm of breast: Secondary | ICD-10-CM | POA: Diagnosis not present

## 2022-09-29 DIAGNOSIS — R079 Chest pain, unspecified: Secondary | ICD-10-CM | POA: Diagnosis not present

## 2022-09-29 DIAGNOSIS — Z9889 Other specified postprocedural states: Secondary | ICD-10-CM | POA: Diagnosis not present

## 2022-10-10 DIAGNOSIS — I081 Rheumatic disorders of both mitral and tricuspid valves: Secondary | ICD-10-CM | POA: Diagnosis not present

## 2022-10-10 DIAGNOSIS — N6321 Unspecified lump in the left breast, upper outer quadrant: Secondary | ICD-10-CM | POA: Diagnosis not present

## 2022-10-10 DIAGNOSIS — Z853 Personal history of malignant neoplasm of breast: Secondary | ICD-10-CM | POA: Diagnosis not present

## 2022-10-10 DIAGNOSIS — R079 Chest pain, unspecified: Secondary | ICD-10-CM | POA: Diagnosis not present

## 2022-10-10 DIAGNOSIS — I3139 Other pericardial effusion (noninflammatory): Secondary | ICD-10-CM | POA: Diagnosis not present

## 2022-10-10 DIAGNOSIS — Z9889 Other specified postprocedural states: Secondary | ICD-10-CM | POA: Diagnosis not present

## 2022-10-10 DIAGNOSIS — N644 Mastodynia: Secondary | ICD-10-CM | POA: Diagnosis not present

## 2022-10-11 DIAGNOSIS — J439 Emphysema, unspecified: Secondary | ICD-10-CM | POA: Diagnosis not present

## 2023-04-02 ENCOUNTER — Telehealth: Payer: Self-pay

## 2023-04-02 NOTE — Telephone Encounter (Signed)
Called patient for AWV. She states that she is heading to another appointment at this time. We will have to call back to reschedule for another time.

## 2023-05-25 ENCOUNTER — Telehealth: Payer: Self-pay | Admitting: Nurse Practitioner

## 2023-05-25 NOTE — Telephone Encounter (Signed)
Patient has not been seen by current PCP in a year. Reached out to the patient to see if they have a new PCP or if they would like to continue care with the provider.  -unable to leave voicemail

## 2023-09-06 ENCOUNTER — Telehealth: Payer: Self-pay

## 2023-09-06 NOTE — Transitions of Care (Post Inpatient/ED Visit) (Signed)
   09/06/2023  Name: Jessica [name] MRN: [id number] DOB: 29-Jul-1946  Today's TOC FU Call Status: Today's TOC FU Call Status:: Unsuccessful Call (1st Attempt) Unsuccessful Call (1st Attempt) Date: 09/06/23  Attempted to reach the patient regarding the most recent Inpatient/ED visit.  Follow Up Plan: Additional outreach attempts will be made to reach the patient to complete the Transitions of Care (Post Inpatient/ED visit) call.   Lonia Chimera, RN, BSN, CEN [employer]- Transition of Care Team.  [facility] [phone]

## 2023-09-07 ENCOUNTER — Telehealth: Payer: Self-pay

## 2023-09-07 NOTE — Transitions of Care (Post Inpatient/ED Visit) (Signed)
   09/07/2023  Name: Taline Olah MRN: [id number] DOB: [birth year]  Today's TOC FU Call Status: Today's TOC FU Call Status:: Unsuccessful Call (2nd Attempt) Unsuccessful Call (2nd Attempt) Date: 09/07/23  Attempted to reach the patient regarding the most recent Inpatient/ED visit.  Follow Up Plan: Additional outreach attempts will be made to reach the patient to complete the Transitions of Care (Post Inpatient/ED visit) call.   Feiga Nadel Daphine Deutscher BSN, RN RN Care Manager   Transitions of Care [facility] Direct Dial Number:  [phone]

## 2023-09-10 ENCOUNTER — Telehealth: Payer: Self-pay | Admitting: *Deleted

## 2023-09-10 NOTE — Transitions of Care (Post Inpatient/ED Visit) (Signed)
   09/10/2023  Name: Capria Sainthilaire MRN: [id number] DOB: January 11, 1946  Today's TOC FU Call Status: Today's TOC FU Call Status:: Unsuccessful Call (3rd Attempt) Unsuccessful Call (3rd Attempt) Date: 09/10/23  Attempted to reach the patient regarding the most recent Inpatient/ED visit.  Follow Up Plan: No further outreach attempts will be made at this time. We have been unable to contact the patient.  Irving Shows [facility], BSN RN Care Manager/ Transition of Care [place] [phone]
